# Patient Record
Sex: Female | Born: 1950 | Race: White | Hispanic: No | Smoking: Former smoker
Health system: Southern US, Community
[De-identification: ages and names within clinical notes are randomized; demographics above are authoritative.]

## PROBLEM LIST (undated history)

## (undated) DIAGNOSIS — R011 Cardiac murmur, unspecified: Secondary | ICD-10-CM

## (undated) DIAGNOSIS — M7581 Other shoulder lesions, right shoulder: Secondary | ICD-10-CM

## (undated) DIAGNOSIS — Z8669 Personal history of other diseases of the nervous system and sense organs: Secondary | ICD-10-CM

## (undated) DIAGNOSIS — R7303 Prediabetes: Secondary | ICD-10-CM

## (undated) DIAGNOSIS — F32A Depression, unspecified: Secondary | ICD-10-CM

## (undated) DIAGNOSIS — E785 Hyperlipidemia, unspecified: Secondary | ICD-10-CM

## (undated) DIAGNOSIS — I10 Essential (primary) hypertension: Secondary | ICD-10-CM

## (undated) DIAGNOSIS — M1990 Unspecified osteoarthritis, unspecified site: Secondary | ICD-10-CM

## (undated) DIAGNOSIS — F329 Major depressive disorder, single episode, unspecified: Secondary | ICD-10-CM

## (undated) DIAGNOSIS — H9190 Unspecified hearing loss, unspecified ear: Secondary | ICD-10-CM

## (undated) DIAGNOSIS — E538 Deficiency of other specified B group vitamins: Secondary | ICD-10-CM

## (undated) DIAGNOSIS — J3489 Other specified disorders of nose and nasal sinuses: Secondary | ICD-10-CM

## (undated) DIAGNOSIS — E042 Nontoxic multinodular goiter: Secondary | ICD-10-CM

## (undated) DIAGNOSIS — K219 Gastro-esophageal reflux disease without esophagitis: Secondary | ICD-10-CM

## (undated) DIAGNOSIS — K759 Inflammatory liver disease, unspecified: Secondary | ICD-10-CM

## (undated) DIAGNOSIS — J189 Pneumonia, unspecified organism: Secondary | ICD-10-CM

## (undated) DIAGNOSIS — G4730 Sleep apnea, unspecified: Secondary | ICD-10-CM

## (undated) DIAGNOSIS — M7521 Bicipital tendinitis, right shoulder: Secondary | ICD-10-CM

## (undated) DIAGNOSIS — M7541 Impingement syndrome of right shoulder: Secondary | ICD-10-CM

## (undated) DIAGNOSIS — Z87442 Personal history of urinary calculi: Secondary | ICD-10-CM

## (undated) DIAGNOSIS — J34829 Nasal valve collapse, unspecified: Secondary | ICD-10-CM

## (undated) DIAGNOSIS — I739 Peripheral vascular disease, unspecified: Secondary | ICD-10-CM

## (undated) HISTORY — DX: Hyperlipidemia, unspecified: E78.5

## (undated) HISTORY — PX: EYE SURGERY: SHX253

## (undated) HISTORY — PX: COLONOSCOPY: SHX174

## (undated) HISTORY — PX: ABDOMINOPLASTY: SUR9

## (undated) HISTORY — PX: ROTATOR CUFF REPAIR: SHX139

## (undated) HISTORY — PX: HERNIA REPAIR: SHX51

## (undated) HISTORY — DX: Unspecified osteoarthritis, unspecified site: M19.90

## (undated) HISTORY — PX: ABDOMINAL HYSTERECTOMY: SHX81

## (undated) HISTORY — PX: KNEE ARTHROSCOPY: SUR90

## (undated) HISTORY — PX: BILATERAL CARPAL TUNNEL RELEASE: SHX6508

---

## 2009-01-08 HISTORY — PX: OTHER SURGICAL HISTORY: SHX169

## 2009-06-10 ENCOUNTER — Ambulatory Visit: Payer: Self-pay

## 2009-07-05 ENCOUNTER — Ambulatory Visit (HOSPITAL_COMMUNITY): Admission: RE | Admit: 2009-07-05 | Discharge: 2009-07-06 | Payer: Self-pay | Admitting: Obstetrics and Gynecology

## 2009-07-29 ENCOUNTER — Ambulatory Visit: Payer: Self-pay | Admitting: General Practice

## 2009-10-13 ENCOUNTER — Ambulatory Visit: Payer: Self-pay | Admitting: *Deleted

## 2009-12-06 ENCOUNTER — Ambulatory Visit: Payer: Self-pay | Admitting: Internal Medicine

## 2009-12-28 ENCOUNTER — Ambulatory Visit: Payer: Self-pay | Admitting: Gastroenterology

## 2010-01-30 ENCOUNTER — Ambulatory Visit: Payer: Self-pay | Admitting: Surgery

## 2010-03-26 LAB — COMPREHENSIVE METABOLIC PANEL
ALT: 56 U/L — ABNORMAL HIGH (ref 0–35)
Alkaline Phosphatase: 120 U/L — ABNORMAL HIGH (ref 39–117)
CO2: 30 mEq/L (ref 19–32)
Chloride: 101 mEq/L (ref 96–112)
GFR calc non Af Amer: >60 mL/min (ref >60)
Glucose, Bld: 86 mg/dL (ref 70–99)
Potassium: 3.7 mEq/L (ref 3.5–5.1)
Sodium: 136 mEq/L (ref 135–145)
Total Protein: 7.2 g/dL (ref 6.0–8.3)

## 2010-03-26 LAB — CBC
HCT: 38.6 % (ref 36.0–46.0)
Hemoglobin: 11 g/dL — ABNORMAL LOW (ref 12.0–15.0)
Hemoglobin: 13.2 g/dL (ref 12.0–15.0)
MCH: 32.6 pg (ref 26.0–34.0)
MCHC: 35.2 g/dL (ref 30.0–36.0)
RBC: 4.16 MIL/uL (ref 3.87–5.11)
WBC: 6.4 10*3/uL (ref 4.0–10.5)

## 2010-03-26 LAB — SURGICAL PCR SCREEN: Staphylococcus aureus: NEGATIVE

## 2010-06-25 ENCOUNTER — Ambulatory Visit: Payer: Self-pay | Admitting: Internal Medicine

## 2010-09-26 ENCOUNTER — Ambulatory Visit: Payer: Self-pay | Admitting: Internal Medicine

## 2013-06-10 DIAGNOSIS — I10 Essential (primary) hypertension: Secondary | ICD-10-CM | POA: Insufficient documentation

## 2013-06-10 DIAGNOSIS — M23329 Other meniscus derangements, posterior horn of medial meniscus, unspecified knee: Secondary | ICD-10-CM | POA: Insufficient documentation

## 2013-07-01 ENCOUNTER — Ambulatory Visit: Payer: Self-pay | Admitting: Family Medicine

## 2013-10-20 DIAGNOSIS — F329 Major depressive disorder, single episode, unspecified: Secondary | ICD-10-CM | POA: Insufficient documentation

## 2013-10-20 DIAGNOSIS — E7800 Pure hypercholesterolemia, unspecified: Secondary | ICD-10-CM | POA: Insufficient documentation

## 2013-10-20 DIAGNOSIS — F32A Depression, unspecified: Secondary | ICD-10-CM | POA: Insufficient documentation

## 2013-10-20 DIAGNOSIS — R7301 Impaired fasting glucose: Secondary | ICD-10-CM | POA: Insufficient documentation

## 2013-12-17 DIAGNOSIS — K219 Gastro-esophageal reflux disease without esophagitis: Secondary | ICD-10-CM | POA: Insufficient documentation

## 2014-02-16 DIAGNOSIS — M7541 Impingement syndrome of right shoulder: Secondary | ICD-10-CM | POA: Insufficient documentation

## 2014-04-19 ENCOUNTER — Ambulatory Visit
Admit: 2014-04-19 | Disposition: A | Discharge status: Home / Self Care | Payer: Self-pay | Attending: Unknown Physician Specialty | Admitting: Unknown Physician Specialty

## 2014-05-03 LAB — SURGICAL PATHOLOGY

## 2014-05-04 DIAGNOSIS — E785 Hyperlipidemia, unspecified: Secondary | ICD-10-CM | POA: Insufficient documentation

## 2015-04-20 ENCOUNTER — Other Ambulatory Visit: Payer: Self-pay | Admitting: Physician Assistant

## 2015-04-20 DIAGNOSIS — Z1231 Encounter for screening mammogram for malignant neoplasm of breast: Secondary | ICD-10-CM

## 2015-04-28 ENCOUNTER — Ambulatory Visit: Payer: Self-pay

## 2015-05-03 ENCOUNTER — Ambulatory Visit
Admission: RE | Admit: 2015-05-03 | Discharge: 2015-05-03 | Disposition: A | Discharge status: Home / Self Care | Payer: Medicare Other | Source: Ambulatory Visit | Attending: Physician Assistant | Admitting: Physician Assistant

## 2015-05-03 DIAGNOSIS — Z1231 Encounter for screening mammogram for malignant neoplasm of breast: Secondary | ICD-10-CM

## 2015-05-06 ENCOUNTER — Encounter: Payer: Self-pay | Admitting: *Deleted

## 2015-05-09 ENCOUNTER — Ambulatory Visit: Payer: Medicare Other | Admitting: Anesthesiology

## 2015-05-09 ENCOUNTER — Encounter
Admission: RE | Disposition: A | Discharge status: Home / Self Care | Payer: Self-pay | Source: Ambulatory Visit | Attending: Ophthalmology

## 2015-05-09 ENCOUNTER — Ambulatory Visit
Admission: RE | Admit: 2015-05-09 | Discharge: 2015-05-09 | Disposition: A | Discharge status: Home / Self Care | Payer: Medicare Other | Source: Ambulatory Visit | Attending: Ophthalmology | Admitting: Ophthalmology

## 2015-05-09 DIAGNOSIS — I10 Essential (primary) hypertension: Secondary | ICD-10-CM | POA: Diagnosis not present

## 2015-05-09 DIAGNOSIS — Z90710 Acquired absence of both cervix and uterus: Secondary | ICD-10-CM | POA: Diagnosis not present

## 2015-05-09 DIAGNOSIS — Z7982 Long term (current) use of aspirin: Secondary | ICD-10-CM | POA: Insufficient documentation

## 2015-05-09 DIAGNOSIS — H2511 Age-related nuclear cataract, right eye: Secondary | ICD-10-CM | POA: Insufficient documentation

## 2015-05-09 DIAGNOSIS — Z880 Allergy status to penicillin: Secondary | ICD-10-CM | POA: Insufficient documentation

## 2015-05-09 DIAGNOSIS — F329 Major depressive disorder, single episode, unspecified: Secondary | ICD-10-CM | POA: Insufficient documentation

## 2015-05-09 DIAGNOSIS — Z9109 Other allergy status, other than to drugs and biological substances: Secondary | ICD-10-CM | POA: Insufficient documentation

## 2015-05-09 DIAGNOSIS — K219 Gastro-esophageal reflux disease without esophagitis: Secondary | ICD-10-CM | POA: Insufficient documentation

## 2015-05-09 DIAGNOSIS — Z79899 Other long term (current) drug therapy: Secondary | ICD-10-CM | POA: Insufficient documentation

## 2015-05-09 DIAGNOSIS — Z96653 Presence of artificial knee joint, bilateral: Secondary | ICD-10-CM | POA: Insufficient documentation

## 2015-05-09 DIAGNOSIS — H269 Unspecified cataract: Secondary | ICD-10-CM | POA: Diagnosis present

## 2015-05-09 DIAGNOSIS — Z9889 Other specified postprocedural states: Secondary | ICD-10-CM | POA: Insufficient documentation

## 2015-05-09 DIAGNOSIS — Z87891 Personal history of nicotine dependence: Secondary | ICD-10-CM | POA: Insufficient documentation

## 2015-05-09 HISTORY — DX: Gastro-esophageal reflux disease without esophagitis: K21.9

## 2015-05-09 HISTORY — DX: Personal history of other diseases of the nervous system and sense organs: Z86.69

## 2015-05-09 HISTORY — DX: Unspecified hearing loss, unspecified ear: H91.90

## 2015-05-09 HISTORY — PX: CATARACT EXTRACTION W/PHACO: SHX586

## 2015-05-09 HISTORY — DX: Essential (primary) hypertension: I10

## 2015-05-09 HISTORY — DX: Depression, unspecified: F32.A

## 2015-05-09 HISTORY — DX: Cardiac murmur, unspecified: R01.1

## 2015-05-09 HISTORY — DX: Major depressive disorder, single episode, unspecified: F32.9

## 2015-05-09 SURGERY — PHACOEMULSIFICATION, CATARACT, WITH IOL INSERTION
Anesthesia: Monitor Anesthesia Care | Site: Eye | Laterality: Right | Wound class: Clean

## 2015-05-09 MED ORDER — ALFENTANIL 500 MCG/ML IJ INJ
INJECTION | INTRAMUSCULAR | Status: DC | PRN
  Administered 2015-05-09: 500 ug via INTRAVENOUS

## 2015-05-09 MED ORDER — TETRACAINE HCL 0.5 % OP SOLN
OPHTHALMIC | Status: DC | PRN
  Administered 2015-05-09: 1 [drp] via OPHTHALMIC

## 2015-05-09 MED ORDER — MOXIFLOXACIN HCL 0.5 % OP SOLN
1.0000 [drp] | Freq: Once | OPHTHALMIC | Status: AC
  Administered 2015-05-09 (×3): 1 [drp] via OPHTHALMIC

## 2015-05-09 MED ORDER — LIDOCAINE HCL (PF) 4 % IJ SOLN
INTRAMUSCULAR | Status: AC
  Filled 2015-05-09: qty 5

## 2015-05-09 MED ORDER — PHENYLEPHRINE HCL 10 % OP SOLN
OPHTHALMIC | Status: AC
  Administered 2015-05-09: 1 [drp] via OPHTHALMIC
  Filled 2015-05-09: qty 5

## 2015-05-09 MED ORDER — TETRACAINE HCL 0.5 % OP SOLN
OPHTHALMIC | Status: AC
  Filled 2015-05-09: qty 2

## 2015-05-09 MED ORDER — CEFUROXIME OPHTHALMIC INJECTION 1 MG/0.1 ML
INJECTION | OPHTHALMIC | Status: AC
  Filled 2015-05-09: qty 0.1

## 2015-05-09 MED ORDER — MOXIFLOXACIN HCL 0.5 % OP SOLN
OPHTHALMIC | Status: AC
  Administered 2015-05-09: 1 [drp] via OPHTHALMIC
  Filled 2015-05-09: qty 3

## 2015-05-09 MED ORDER — SODIUM CHLORIDE 0.9 % IV SOLN
INTRAVENOUS | Status: DC
  Administered 2015-05-09: 07:00:00 via INTRAVENOUS

## 2015-05-09 MED ORDER — POVIDONE-IODINE 5 % OP SOLN
OPHTHALMIC | Status: AC
  Filled 2015-05-09: qty 30

## 2015-05-09 MED ORDER — BUPIVACAINE HCL (PF) 0.75 % IJ SOLN
INTRAMUSCULAR | Status: DC | PRN
  Administered 2015-05-09: 08:00:00 via OPHTHALMIC

## 2015-05-09 MED ORDER — EPINEPHRINE HCL 1 MG/ML IJ SOLN
INTRAMUSCULAR | Status: AC
  Filled 2015-05-09: qty 2

## 2015-05-09 MED ORDER — CEFUROXIME OPHTHALMIC INJECTION 1 MG/0.1 ML
INJECTION | OPHTHALMIC | Status: DC | PRN
  Administered 2015-05-09: 0.1 mL via INTRACAMERAL

## 2015-05-09 MED ORDER — PHENYLEPHRINE HCL 10 % OP SOLN
1.0000 [drp] | Freq: Once | OPHTHALMIC | Status: AC
  Administered 2015-05-09 (×4): 1 [drp] via OPHTHALMIC

## 2015-05-09 MED ORDER — LIDOCAINE HCL (PF) 4 % IJ SOLN
INTRAMUSCULAR | Status: DC | PRN
  Administered 2015-05-09: 08:00:00 via OPHTHALMIC

## 2015-05-09 MED ORDER — CYCLOPENTOLATE HCL 2 % OP SOLN
OPHTHALMIC | Status: AC
  Administered 2015-05-09: 1 [drp] via OPHTHALMIC
  Filled 2015-05-09: qty 2

## 2015-05-09 MED ORDER — NA CHONDROIT SULF-NA HYALURON 40-17 MG/ML IO SOLN
INTRAOCULAR | Status: AC
  Filled 2015-05-09: qty 1

## 2015-05-09 MED ORDER — HYALURONIDASE HUMAN 150 UNIT/ML IJ SOLN
INTRAMUSCULAR | Status: AC
  Filled 2015-05-09: qty 1

## 2015-05-09 MED ORDER — MOXIFLOXACIN HCL 0.5 % OP SOLN
OPHTHALMIC | Status: DC | PRN
  Administered 2015-05-09: 1 [drp] via OPHTHALMIC

## 2015-05-09 MED ORDER — POVIDONE-IODINE 5 % OP SOLN
OPHTHALMIC | Status: DC | PRN
  Administered 2015-05-09: 1 via OPHTHALMIC

## 2015-05-09 MED ORDER — NA CHONDROIT SULF-NA HYALURON 40-17 MG/ML IO SOLN
INTRAOCULAR | Status: DC | PRN
  Administered 2015-05-09: 1 mL via INTRAOCULAR

## 2015-05-09 MED ORDER — CYCLOPENTOLATE HCL 2 % OP SOLN
1.0000 [drp] | Freq: Once | OPHTHALMIC | Status: AC
  Administered 2015-05-09 (×4): 1 [drp] via OPHTHALMIC

## 2015-05-09 MED ORDER — BUPIVACAINE HCL (PF) 0.75 % IJ SOLN
INTRAMUSCULAR | Status: AC
  Filled 2015-05-09: qty 10

## 2015-05-09 MED ORDER — CARBACHOL 0.01 % IO SOLN
INTRAOCULAR | Status: DC | PRN
  Administered 2015-05-09: 0.5 mL via INTRAOCULAR

## 2015-05-09 SURGICAL SUPPLY — 30 items
CANNULA ANT/CHMB 27GA (MISCELLANEOUS) ×2 IMPLANT
CORD BIP STRL DISP 12FT (MISCELLANEOUS) ×2 IMPLANT
CUP MEDICINE 2OZ PLAST GRAD ST (MISCELLANEOUS) ×2 IMPLANT
DRAPE XRAY CASSETTE 23X24 (DRAPES) ×2 IMPLANT
ERASER HMR WETFIELD 18G (MISCELLANEOUS) ×2 IMPLANT
GLOVE BIO SURGEON STRL SZ8 (GLOVE) ×2 IMPLANT
GLOVE SURG LX 6.5 MICRO (GLOVE) ×1
GLOVE SURG LX 8.0 MICRO (GLOVE) ×1
GLOVE SURG LX STRL 6.5 MICRO (GLOVE) ×1 IMPLANT
GLOVE SURG LX STRL 8.0 MICRO (GLOVE) ×1 IMPLANT
GOWN STRL REUS W/ TWL LRG LVL3 (GOWN DISPOSABLE) ×1 IMPLANT
GOWN STRL REUS W/ TWL XL LVL3 (GOWN DISPOSABLE) ×1 IMPLANT
GOWN STRL REUS W/TWL LRG LVL3 (GOWN DISPOSABLE) ×1
GOWN STRL REUS W/TWL XL LVL3 (GOWN DISPOSABLE) ×1
LENS IOL ACRSF IQ ULTRA 24.0 (Intraocular Lens) ×1 IMPLANT
LENS IOL ACRYSOF IQ 24.0 (Intraocular Lens) ×2 IMPLANT
PACK CATARACT (MISCELLANEOUS) ×2 IMPLANT
PACK CATARACT DINGLEDEIN LX (MISCELLANEOUS) ×2 IMPLANT
PACK EYE AFTER SURG (MISCELLANEOUS) ×2 IMPLANT
SHLD EYE VISITEC  UNIV (MISCELLANEOUS) ×2 IMPLANT
SOL BSS BAG (MISCELLANEOUS) ×2
SOL PREP PVP 2OZ (MISCELLANEOUS) ×2
SOLUTION BSS BAG (MISCELLANEOUS) ×1 IMPLANT
SOLUTION PREP PVP 2OZ (MISCELLANEOUS) ×1 IMPLANT
SUT SILK 5-0 (SUTURE) ×2 IMPLANT
SYR 3ML LL SCALE MARK (SYRINGE) ×2 IMPLANT
SYR 5ML LL (SYRINGE) ×2 IMPLANT
SYR TB 1ML 27GX1/2 LL (SYRINGE) ×2 IMPLANT
WATER STERILE IRR 1000ML POUR (IV SOLUTION) ×2 IMPLANT
WIPE NON LINTING 3.25X3.25 (MISCELLANEOUS) ×2 IMPLANT

## 2015-05-09 NOTE — Anesthesia Postprocedure Evaluation (Signed)
Anesthesia Post Note  Patient: Alyssa Mcclure  Procedure(s) Performed: Procedure(s) (LRB): CATARACT EXTRACTION PHACO AND INTRAOCULAR LENS PLACEMENT (IOC) (Right)  Patient location during evaluation: Short Stay Anesthesia Type: MAC Level of consciousness: awake and alert and oriented Pain management: pain level controlled Vital Signs Assessment: post-procedure vital signs reviewed and stable Respiratory status: spontaneous breathing Cardiovascular status: stable Anesthetic complications: no    Last Vitals:  Filed Vitals:   05/09/15 0638 05/09/15 0855  BP: 129/77 125/56  Pulse: 74 67  Temp: 36.9 C 36.9 C  Resp: 16 14    Last Pain: There were no vitals filed for this visit.               Zachary GeorgeWeatherly,  Kaleigha Chamberlin F

## 2015-05-09 NOTE — Transfer of Care (Signed)
Immediate Anesthesia Transfer of Care Note  Patient: Alyssa Mcclure  Procedure(s) Performed: Procedure(s) with comments: CATARACT EXTRACTION PHACO AND INTRAOCULAR LENS PLACEMENT (IOC) (Right) - US 45.1 AP% 21.0 CDE 19.36 Fluid Pack Lot # [id number]  Patient Location: PACU and Short Stay  Anesthesia Type:MAC  Level of Consciousness: awake, alert  and oriented  Airway & Oxygen Therapy: Patient Spontanous Breathing  Post-op Assessment: Report given to RN and Post -op Vital signs reviewed and stable  Post vital signs: Reviewed and stable  Last Vitals:  Filed Vitals:   05/09/15 0638 05/09/15 0855  BP: 129/77 125/56  Pulse: 74 70  Temp: 36.9 C 37.7 C  Resp: 16 18    Last Pain: There were no vitals filed for this visit.       Complications: No apparent anesthesia complications

## 2015-05-09 NOTE — Anesthesia Preprocedure Evaluation (Signed)
Anesthesia Evaluation  Patient identified by MRN, date of birth, ID band Patient awake    Reviewed: Allergy & Precautions, NPO status , Patient's Chart, lab work & pertinent test results  Airway Mallampati: II       Dental  (+) Teeth Intact   Pulmonary neg pulmonary ROS, former smoker,    breath sounds clear to auscultation       Cardiovascular Exercise Tolerance: Good hypertension, Pt. on medications  Rhythm:Regular     Neuro/Psych    GI/Hepatic Neg liver ROS, GERD  ,  Endo/Other  negative endocrine ROS  Renal/GU negative Renal ROS     Musculoskeletal negative musculoskeletal ROS (+)   Abdominal Normal abdominal exam  (+)   Peds negative pediatric ROS (+)  Hematology negative hematology ROS (+)   Anesthesia Other Findings   Reproductive/Obstetrics                             Anesthesia Physical Anesthesia Plan  ASA: II  Anesthesia Plan: MAC   Post-op Pain Management:    Induction: Intravenous  Airway Management Planned: Natural Airway  Additional Equipment:   Intra-op Plan:   Post-operative Plan:   Informed Consent: I have reviewed the patients History and Physical, chart, labs and discussed the procedure including the risks, benefits and alternatives for the proposed anesthesia with the patient or authorized representative who has indicated his/her understanding and acceptance.     Plan Discussed with: CRNA  Anesthesia Plan Comments:         Anesthesia Quick Evaluation

## 2015-05-09 NOTE — Op Note (Signed)
Date of Surgery: 05/09/2015 Date of Dictation: 05/09/2015 8:53 AM Pre-operative Diagnosis:  Nuclear Sclerotic Cataract and Cortical Cataract right Eye Post-operative Diagnosis: same Procedure performed: Extra-capsular Cataract Extraction (ECCE) with placement of a posterior chamber intraocular lens (IOL) right Eye IOL:  Implant Name Type Inv. Item Serial No. Manufacturer Lot No. LRB No. Used  LENS IOL ACRYSOF IQ 24.0 - [id number] Intraocular Lens LENS IOL ACRYSOF IQ 24.0 [id number] ALCON   Right 1   Anesthesia: 2% Lidocaine and 4% Marcaine in a 50/50 mixture with 10 unites/ml of Hylenex given as a peribulbar Anesthesiologist: Anesthesiologist: Gijsbertus F Darleene CleaverVan Staveren, MD CRNA: Omer JackJanice Weatherly, CRNA Complications: none Estimated Blood Loss: less than 1 ml  Description of procedure:  The patient was given anesthesia and sedation via intravenous access. The patient was then prepped and draped in the usual fashion. A 25-gauge needle was bent for initiating the capsulorhexis. A 5-0 silk suture was placed through the conjunctiva superior and inferiorly to serve as bridle sutures. Hemostasis was obtained at the superior limbus using an eraser cautery. A partial thickness groove was made at the anterior surgical limbus with a 64 Beaver blade and this was dissected anteriorly with an SYSCOlcon Crescent knife. The anterior chamber was entered at 10 o'clock with a 1.0 mm paracentesis knife and through the lamellar dissection with a 2.6 mm Alcon keratome. Epi-Shugarcaine 0.5 CC [9 cc BSS Plus (Alcon), 3 cc 4% preservative-free lidocaine (Hospira) and 4 cc 1:1000 preservative-free, bisulfite-free epinephrine] was injected into the anterior chamber via the paracentesis tract. Epi-Shugarcaine 0.5 CC [9 cc BSS Plus (Alcon), 3 cc 4% preservative-free lidocaine (Hospira) and 4 cc 1:1000 preservative-free, bisulfite-free epinephrine] was injected into the anterior chamber via the paracentesis tract. DiscoVisc was  injected to replace the aqueous and a continuous tear curvilinear capsulorhexis was performed using a bent 25-gauge needle.  Balance salt on a syringe was used to perform hydro-dissection and phacoemulsification was carried out using a divide and conquer technique. Procedure(s) with comments: CATARACT EXTRACTION PHACO AND INTRAOCULAR LENS PLACEMENT (IOC) (Right) - US 45.1 AP% 21.0 CDE 19.36 Fluid Pack Lot # [id number]. Irrigation/aspiration was used to remove the residual cortex and the capsular bag was inflated with DiscoVisc. The intraocular lens was inserted into the capsular bag using a pre-loaded UltraSert Delivery System. Irrigation/aspiration was used to remove the residual DiscoVisc. The wound was inflated with balanced salt and checked for leaks. None were found. Miostat was injected via the paracentesis track and 0.1 ml of cefuroxime containing 1 mg of drug  was injected via the paracentesis track. The wound was checked for leaks again and none were found.   The bridal sutures were removed and two drops of Vigamox were placed on the eye. An eye shield was placed to protect the eye and the patient was discharged to the recovery area in good condition.   Xerxes Agrusa MD

## 2015-05-09 NOTE — Interval H&P Note (Signed)
History and Physical Interval Note:  05/09/2015 7:24 AM  Alyssa Mcclure  has presented today for surgery, with the diagnosis of CATARACT  The various methods of treatment have been discussed with the patient and family. After consideration of risks, benefits and other options for treatment, the patient has consented to  Procedure(s): CATARACT EXTRACTION PHACO AND INTRAOCULAR LENS PLACEMENT (IOC) (Right) as a surgical intervention .  The patient's history has been reviewed, patient examined, no change in status, stable for surgery.  I have reviewed the patient's chart and labs.  Questions were answered to the patient's satisfaction.     [name]

## 2015-05-09 NOTE — Discharge Instructions (Signed)
Eye Surgery Discharge Instructions  Expect mild scratchy sensation or mild soreness. DO NOT RUB YOUR EYE!  The day of surgery:  Minimal physical activity, but bed rest is not required  No reading, computer work, or close hand work  No bending, lifting, or straining.  May watch TV  For 24 hours:  No driving, legal decisions, or alcoholic beverages  Safety precautions  Eat anything you prefer: It is better to start with liquids, then soup then solid foods.  _____ Eye patch should be worn until postoperative exam tomorrow.  ____ Solar shield eyeglasses should be worn for comfort in the sunlight/patch while sleeping  Resume all regular medications including aspirin or Coumadin if these were discontinued prior to surgery. You may shower, bathe, shave, or wash your hair. Tylenol may be taken for mild discomfort.  Call your doctor if you experience significant pain, nausea, or vomiting, fever > 101 or other signs of infection. [phone] or [phone] Specific instructions:  Follow-up Information    Follow up with Charlot Gouin, MD. Go in 1 day.   Specialty:  Ophthalmology   Why:  Appointment time is set for 10:45 am TOMORROW , For wound re-check   Contact information:   [address]   [place] [id number] [phone]

## 2015-05-09 NOTE — H&P (Signed)
See scanned note.

## 2015-05-17 ENCOUNTER — Other Ambulatory Visit: Payer: Self-pay | Admitting: Physician Assistant

## 2015-05-17 DIAGNOSIS — R1319 Other dysphagia: Secondary | ICD-10-CM

## 2015-05-20 ENCOUNTER — Ambulatory Visit
Admission: RE | Admit: 2015-05-20 | Discharge: 2015-05-20 | Disposition: A | Discharge status: Home / Self Care | Payer: Medicare Other | Source: Ambulatory Visit | Attending: Physician Assistant | Admitting: Physician Assistant

## 2015-05-20 DIAGNOSIS — R1319 Other dysphagia: Secondary | ICD-10-CM | POA: Diagnosis present

## 2015-05-20 DIAGNOSIS — K449 Diaphragmatic hernia without obstruction or gangrene: Secondary | ICD-10-CM | POA: Diagnosis not present

## 2015-12-12 ENCOUNTER — Other Ambulatory Visit: Payer: Self-pay | Admitting: Physician Assistant

## 2015-12-12 DIAGNOSIS — R1011 Right upper quadrant pain: Secondary | ICD-10-CM

## 2015-12-16 ENCOUNTER — Ambulatory Visit
Admission: RE | Admit: 2015-12-16 | Discharge: 2015-12-16 | Disposition: A | Discharge status: Home / Self Care | Payer: Medicare Other | Source: Ambulatory Visit | Attending: Physician Assistant | Admitting: Physician Assistant

## 2015-12-16 DIAGNOSIS — R110 Nausea: Secondary | ICD-10-CM | POA: Insufficient documentation

## 2015-12-16 DIAGNOSIS — R1011 Right upper quadrant pain: Secondary | ICD-10-CM | POA: Diagnosis present

## 2016-02-21 ENCOUNTER — Encounter: Payer: Self-pay | Admitting: Urology

## 2016-02-21 ENCOUNTER — Ambulatory Visit: Payer: Medicare Other | Admitting: Urology

## 2016-02-21 ENCOUNTER — Ambulatory Visit: Payer: Self-pay | Admitting: Urology

## 2016-02-21 VITALS — BP 145/81 | HR 91 | Ht 64.0 in | Wt 179.1 lb

## 2016-02-21 DIAGNOSIS — N3941 Urge incontinence: Secondary | ICD-10-CM

## 2016-02-21 DIAGNOSIS — N952 Postmenopausal atrophic vaginitis: Secondary | ICD-10-CM | POA: Diagnosis not present

## 2016-02-21 DIAGNOSIS — R350 Frequency of micturition: Secondary | ICD-10-CM

## 2016-02-21 LAB — URINALYSIS, COMPLETE
Bilirubin, UA: NEGATIVE
GLUCOSE, UA: NEGATIVE
KETONES UA: NEGATIVE
Leukocytes, UA: NEGATIVE
NITRITE UA: NEGATIVE
PROTEIN UA: NEGATIVE
RBC, UA: NEGATIVE
UUROB: 0.2 mg/dL (ref 0.2–1.0)
pH, UA: 5.5 (ref 5.0–7.5)

## 2016-02-21 LAB — MICROSCOPIC EXAMINATION: BACTERIA UA: NONE SEEN

## 2016-02-21 LAB — BLADDER SCAN AMB NON-IMAGING: SCAN RESULT: 26

## 2016-02-21 NOTE — Progress Notes (Signed)
02/21/2016 3:04 PM   Alyssa Mcclure 1950-08-17 [id number]  Referring provider: [name], MD [address] [facility] [address] [facility] [place], [place] [address]  Chief Complaint  Patient presents with  . New Patient (Initial Visit)    Urinary frequency referred by Merlinda Frederick MD    HPI: Patient is a 66 -year-old Caucasian female who is referred to [place] by, [name], PA, for urinary incontinence.  Patient states that she has had urinary incontinence for years.  She had a bladder and colon lift in 2011.  It worked for two weeks and then the symptoms returned.    Patient has incontinence with urgency and stress.   She is experiencing 7 to 8 incontinent episodes during the day. She is experiencing one incontinent episodes during the night.  Her incontinence volume is large, sometimes going through the clothes.  She is wearing 6 to 8 pads/depends daily.    She is having associated urinary frequency, urgency and nocturia.   She does not have a history of urinary tract infections, STI's or injury to the bladder.   She denies dysuria, gross hematuria, suprapubic pain, back pain, abdominal pain or flank pain.   She has not had any recent fevers, chills, nausea or vomiting.   She does not have a history of nephrolithiasis or GU trauma.   She is not sexually active.  She is post menopausal.   She admits to constipation.  She is not having pain with bladder filling.    She has not had any recent imaging studies.      She is drinking a lot of water daily.   She is drinking two caffeinated beverages daily.  She is drinking a glass of wine daily.    Her risk factors for incontinence are obesity, (she is currently a member of a gym and exercises regularly)  a family history of incontinence, age, caffeine, diabetes, depression, vaginal atrophy and pelvic surgery.    She is taking  ACE inhibitors and antidepressants.  She is going to [place], [place] in the fall and does not want to  have this problem when she is on vacation.  Her UA was unremarkable at this time.  Her PVR is 26 mL.    PMH: Past Medical History:  Diagnosis Date  . Arthritis   . Depression   . GERD (gastroesophageal reflux disease)   . H/O Bell's palsy   . Heart murmur   . HLD (hyperlipidemia)   . HOH (hard of hearing)   . Hypertension     Surgical History: Past Surgical History:  Procedure Laterality Date  . ABDOMINAL HYSTERECTOMY    . BILATERAL CARPAL TUNNEL RELEASE Bilateral   . bladder lift  2011  . CATARACT EXTRACTION W/PHACO Right 05/09/2015   Procedure: CATARACT EXTRACTION PHACO AND INTRAOCULAR LENS PLACEMENT (IOC);  Surgeon: Sallee Lange, MD;  Location: [facility] ORS;  Service: Ophthalmology;  Laterality: Right;  [place] 45.1 AP% 21.0 CDE 19.36 Fluid Pack Lot # [id number]  . HERNIA REPAIR    . KNEE ARTHROSCOPY Bilateral   . ROTATOR CUFF REPAIR Bilateral     Home Medications:  Allergies as of 02/21/2016      Reactions   Penicillins    Statins Other (See Comments)   Latex Rash      Medication List       Accurate as of 02/21/16  3:04 PM. Always use your most recent med list.          aspirin  EC 81 MG tablet Take 81 mg by mouth daily.   Co Q-10 100 MG Caps Take 1 capsule by mouth every morning.   DULoxetine 60 MG capsule Commonly known as:  CYMBALTA Take 60 mg by mouth daily.   hydrochlorothiazide 25 MG tablet Commonly known as:  HYDRODIURIL Take 25 mg by mouth daily.   losartan 100 MG tablet Commonly known as:  COZAAR Take 100 mg by mouth daily.   pantoprazole 40 MG tablet Commonly known as:  PROTONIX Take 40 mg by mouth daily.   traZODone 50 MG tablet Commonly known as:  DESYREL Take by mouth.       Allergies:  Allergies  Allergen Reactions  . Penicillins   . Statins Other (See Comments)  . Latex Rash    Family History: Family History  Problem Relation Age of Onset  . Breast cancer Daughter 60  . Breast cancer Paternal Aunt   . Breast cancer  Cousin     mat cousin  . Prostate cancer Neg Hx   . Kidney cancer Neg Hx   . Bladder Cancer Neg Hx     Social History:  reports that she has quit smoking. Her smoking use included Cigarettes. She has never used smokeless tobacco. She reports that she drinks alcohol. She reports that she does not use drugs.  ROS: UROLOGY Frequent Urination?: Yes Hard to postpone urination?: Yes Burning/pain with urination?: No Get up at night to urinate?: Yes Leakage of urine?: Yes Urine stream starts and stops?: No Trouble starting stream?: No Do you have to strain to urinate?: No Blood in urine?: No Urinary tract infection?: No Sexually transmitted disease?: Yes Injury to kidneys or bladder?: No Painful intercourse?: No Weak stream?: Yes Currently pregnant?: No Vaginal bleeding?: No Last menstrual period?: n  Gastrointestinal Nausea?: Yes Vomiting?: Yes Indigestion/heartburn?: Yes Diarrhea?: No Constipation?: Yes  Constitutional Fever: No Night sweats?: No Weight loss?: No Fatigue?: Yes  Skin Skin rash/lesions?: No Itching?: No  Eyes Blurred vision?: Yes Double vision?: No  Ears/Nose/Throat Sore throat?: No Sinus problems?: Yes  Hematologic/Lymphatic Swollen glands?: No Easy bruising?: Yes  Cardiovascular Leg swelling?: No Chest pain?: No  Respiratory Cough?: No Shortness of breath?: No  Endocrine Excessive thirst?: No  Musculoskeletal Back pain?: No Joint pain?: Yes  Neurological Headaches?: No Dizziness?: No  Psychologic Depression?: No Anxiety?: No  Physical Exam: BP (!) 145/81   Pulse 91   Ht 5\' 4"  (1.626 m)   Wt 179 lb 1.6 oz (81.2 kg)   BMI 30.74 kg/m   Constitutional: Well nourished. Alert and oriented, No acute distress. HEENT: [place] AT, moist mucus membranes. Trachea midline, no masses. Cardiovascular: No clubbing, cyanosis, or edema. Respiratory: Normal respiratory effort, no increased work of breathing. GI: Abdomen is soft, non  tender, non distended, no abdominal masses. Liver and spleen not palpable.  No hernias appreciated.  Stool sample for occult testing is not indicated.   GU: No CVA tenderness.  No bladder fullness or masses.  Atrophic external genitalia, normal pubic hair distribution, no lesions.  Normal urethral meatus, no lesions, no prolapse, no discharge.   No urethral masses, tenderness and/or tenderness. No bladder fullness, tenderness or masses. Pale vagina mucosa, poor estrogen effect, no discharge, no lesions, good pelvic support, sling in place, no cystocele.  Small rectocele is noted.  Cervix, uterus and adnexa are surgically absent.  Anus and perineum are without rashes or lesions.    Skin: No rashes, bruises or suspicious lesions. Lymph: No cervical or inguinal  adenopathy. Neurologic: Grossly intact, no focal deficits, moving all 4 extremities. Psychiatric: Normal mood and affect.  Laboratory Data: Lab Results  Component Value Date   WBC 7.0 07/06/2009   HGB 11.0 REPEATED TO VERIFY (L) 07/06/2009   HCT 31.2 (L) 07/06/2009   MCV 92.6 07/06/2009   PLT  07/06/2009    152 DELTA CHECK NOTED SPECIMEN CHECKED FOR CLOTS REPEATED TO VERIFY    Lab Results  Component Value Date   CREATININE 0.68 07/04/2009    Lab Results  Component Value Date   AST 53 (H) 07/04/2009   Lab Results  Component Value Date   ALT 56 (H) 07/04/2009   Urinalysis Unremarkable.  See EPIC.  Pertinent Imaging: Results for MABLEAN, VENDITTO (MRN [id number]) as of 02/21/2016 15:08  Ref. Range 02/21/2016 14:32  Scan Result Unknown 26    Assessment & Plan:    1. Urge incontinence  - offered behavioral therapies, bladder training, bladder control strategies and pelvic floor muscle training - discussed PT and PTNS - she is interested in both of these therapies, but she is not sure her insurance will pay for the therapies  - fluid management - good fluid intake  - offered medical therapy with anticholinergic therapy or beta-3  adrenergic receptor agonist and the potential side effects of each therapy   - would like to try the beta-3 adrenergic receptor agonist (Myrbetriq).  Given Myrbetriq 25 mg samples, #28.  I have reviewed with the patient of the side effects of Myrbetriq, such as: elevation in BP, urinary retention and/or HA.     - will have an appointment with Dr. Sherron Monday as she has had a sling placed and it was ineffective    2. Vaginal atrophy  - I explained to the patient that when women go through menopause and her estrogen levels are severely diminished, the normal vaginal mucosa will change.    - A new medication, prasterone (Intrarosa) is a vaginal suppository containing DHEA also changes the vaginal pH.  I have given her samples as we did not have estrogen samples available at this time   Return for appointment with Dr Sherron Monday.  These notes generated with voice recognition software. I apologize for typographical errors.  Michiel Cowboy, PA-C  [facility] [address], [address] [place], [place] [address] [phone]

## 2016-03-01 ENCOUNTER — Encounter: Payer: Self-pay | Admitting: Urology

## 2016-03-01 ENCOUNTER — Ambulatory Visit: Payer: Medicare Other | Admitting: Urology

## 2016-03-01 VITALS — BP 115/77 | HR 98 | Ht 65.0 in | Wt 179.0 lb

## 2016-03-01 DIAGNOSIS — N3946 Mixed incontinence: Secondary | ICD-10-CM | POA: Diagnosis not present

## 2016-03-01 NOTE — Progress Notes (Signed)
03/01/2016 2:48 PM   Alyssa Mcclure 12/06/1950 [id number]  Referring provider: [name], MD [address] [facility] [address] [facility] [place], [place] [address]  Chief Complaint  Patient presents with  . Urinary Incontinence    HPI: I was consulted to assess the patient's urinary incontinence worsening over years. She describes prolapse surgery with the sling likely by a gynecologist in [place]. She describes primarily stress incontinence prior to that surgery. She thinks her incontinence has worsened since  She currently leaks with coughing and sneezing and bending and lifting. 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 Standing she can leak high volumes without any awareness. She has no bedwetting. She can soak 3-5 pads per day  She voids every 2 hours and gets up 2-3 times to urinate. She has a good flow  She describes vaginal splinting for bowel movements and is prone to constipation.  She occasionally might get a urinary tract infection and drinks a lot of fluids  Modifying factors: There are no other modifying factors  Associated signs and symptoms: There are no other associated signs and symptoms Aggravating and relieving factors: There are no other aggravating or relieving factors Severity: Moderate Duration: Persistent   PMH: Past Medical History:  Diagnosis Date  . Arthritis   . Depression   . GERD (gastroesophageal reflux disease)   . H/O Bell's palsy   . Heart murmur   . HLD (hyperlipidemia)   . HOH (hard of hearing)   . Hypertension     Surgical History: Past Surgical History:  Procedure Laterality Date  . ABDOMINAL HYSTERECTOMY    . BILATERAL CARPAL TUNNEL RELEASE Bilateral   . bladder lift  2011  . CATARACT EXTRACTION W/PHACO Right 05/09/2015   Procedure: CATARACT EXTRACTION PHACO AND INTRAOCULAR LENS PLACEMENT (IOC);  Surgeon: Sallee Lange, MD;  Location: [facility] ORS;   Service: Ophthalmology;  Laterality: Right;  [place] 45.1 AP% 21.0 CDE 19.36 Fluid Pack Lot # [id number]  . HERNIA REPAIR    . KNEE ARTHROSCOPY Bilateral   . ROTATOR CUFF REPAIR Bilateral     Home Medications:  Allergies as of 03/01/2016      Reactions   Penicillins    Statins Other (See Comments)   Latex Rash      Medication List       Accurate as of 03/01/16  2:48 PM. Always use your most recent med list.          aspirin EC 81 MG tablet Take 81 mg by mouth daily.   Co Q-10 100 MG Caps Take 1 capsule by mouth every morning.   DULoxetine 60 MG capsule Commonly known as:  CYMBALTA Take 60 mg by mouth daily.   hydrochlorothiazide 25 MG tablet Commonly known as:  HYDRODIURIL Take 25 mg by mouth daily.   losartan 100 MG tablet Commonly known as:  COZAAR Take 100 mg by mouth daily.   pantoprazole 40 MG tablet Commonly known as:  PROTONIX Take 40 mg by mouth daily.   traZODone 50 MG tablet Commonly known as:  DESYREL Take by mouth.       Allergies:  Allergies  Allergen Reactions  . Penicillins   . Statins Other (See Comments)  . Latex Rash    Family History: Family History  Problem Relation Age of Onset  . Breast cancer Daughter 35  . Breast cancer Paternal Aunt   . Breast cancer Cousin  mat cousin  . Prostate cancer Neg Hx   . Kidney cancer Neg Hx   . Bladder Cancer Neg Hx     Social History:  reports that she has quit smoking. Her smoking use included Cigarettes. She has never used smokeless tobacco. She reports that she drinks alcohol. She reports that she does not use drugs.  ROS: UROLOGY Frequent Urination?: Yes Hard to postpone urination?: Yes Burning/pain with urination?: No Get up at night to urinate?: Yes Leakage of urine?: Yes Urine stream starts and stops?: Yes Trouble starting stream?: No Do you have to strain to urinate?: No Blood in urine?: No Urinary tract infection?: No Sexually transmitted disease?: No Injury to kidneys or  bladder?: No Painful intercourse?: No Weak stream?: No Currently pregnant?: No Vaginal bleeding?: No Last menstrual period?: n  Gastrointestinal Nausea?: No Vomiting?: No Indigestion/heartburn?: No Diarrhea?: No Constipation?: Yes  Constitutional Fever: No Night sweats?: No Weight loss?: Yes Fatigue?: Yes  Skin Skin rash/lesions?: No Itching?: No  Eyes Blurred vision?: No Double vision?: No  Ears/Nose/Throat Sore throat?: No Sinus problems?: Yes  Hematologic/Lymphatic Swollen glands?: No Easy bruising?: No  Cardiovascular Leg swelling?: No Chest pain?: No  Respiratory Cough?: Yes Shortness of breath?: No  Endocrine Excessive thirst?: No  Musculoskeletal Back pain?: Yes Joint pain?: Yes  Neurological Headaches?: No Dizziness?: No  Psychologic Depression?: No Anxiety?: No  Physical Exam: BP 115/77   Pulse 98   Ht 5\' 5"  (1.651 m)   Wt 81.2 kg (179 lb)   BMI 29.79 kg/m   Constitutional:  Alert and oriented, No acute distress. HEENT: [place] AT, moist mucus membranes.  Trachea midline, no masses. Cardiovascular: No clubbing, cyanosis, or edema. Respiratory: Normal respiratory effort, no increased work of breathing. GI: Abdomen is soft, nontender, nondistended, no abdominal masses GU: No CVA tenderness. On pelvic examination the patient had a fixed bladder neck and a negative cough test. I could feel a ridge bilaterally at the urethrovesical angle and she likely has had a trans-obturator tape procedure. I could not see or feel any definitive extrusion. She had a very high milder grade 2 cystocele that did not descend. She had very reasonable vaginal length. She had a small grade 1 rectocele and mild perineal bulging. She pointed to the perineal area where she presses and does her splinting maneuver Skin: No rashes, bruises or suspicious lesions. Lymph: No cervical or inguinal adenopathy. Neurologic: Grossly intact, no focal deficits, moving all 4  extremities. Psychiatric: Normal mood and affect.  Laboratory Data: Lab Results  Component Value Date   WBC 7.0 07/06/2009   HGB 11.0 REPEATED TO VERIFY (L) 07/06/2009   HCT 31.2 (L) 07/06/2009   MCV 92.6 07/06/2009   PLT  07/06/2009    152 DELTA CHECK NOTED SPECIMEN CHECKED FOR CLOTS REPEATED TO VERIFY    Lab Results  Component Value Date   CREATININE 0.68 07/04/2009    No results found for: PSA  No results found for: TESTOSTERONE  No results found for: HGBA1C  Urinalysis    Component Value Date/Time   APPEARANCEUR Clear 02/21/2016 1426   GLUCOSEU Negative 02/21/2016 1426   BILIRUBINUR Negative 02/21/2016 1426   PROTEINUR Negative 02/21/2016 1426   NITRITE Negative 02/21/2016 1426   LEUKOCYTESUR Negative 02/21/2016 1426    Pertinent Imaging: none  Assessment & Plan:  The patient has mixed incontinence and likely is having unstable bladder contractions with high volume leakage on times not associated with awareness. She has frequency and nighttime frequency. She is  splinting for bowel movements  On pelvic examination the patient had a fixed bladder neck and a negative cough test. I could feel a ridge bilaterally at the urethrovesical angle and she likely has had a trans-obturator tape procedure. I could not see or feel any definitive extrusion. She had a very high milder grade 2 cystocele that did not descend. She had very reasonable vaginal length. She had a small grade 1 rectocele and mild perineal bulging. She pointed to the perineal area where she presses and does her splinting maneuver. Moderate atophy  I believe she is tried to beta 3 agonists at 25 mg but not other medications; Carollee Herter also tried DHEA suppositories.,  She has had mild benefit from the beta 3 agonists but has not tried the suppository at  The patient will be evaluated with urodynamics and I will perform cystoscopy on her next visit here in clinic. We will proceed accordingly.  It'll be interesting  to see how overactive her bladder is; however her bladder descends. Urethral injectables may be a distant option to help her as well. On her next visit I will draw her a picture and discuss her incontinence and her splinting as well as perform cystoscopy  There are no diagnoses linked to this encounter.  No Follow-up on file.  Martina Sinner, MD  [facility] [address] [place], [place] [address] [phone]

## 2016-03-02 DIAGNOSIS — G4733 Obstructive sleep apnea (adult) (pediatric): Secondary | ICD-10-CM | POA: Insufficient documentation

## 2016-03-02 DIAGNOSIS — J3489 Other specified disorders of nose and nasal sinuses: Secondary | ICD-10-CM | POA: Insufficient documentation

## 2016-03-19 ENCOUNTER — Encounter: Payer: Self-pay | Admitting: *Deleted

## 2016-03-27 NOTE — H&P (Signed)
See scanned note.

## 2016-03-28 ENCOUNTER — Ambulatory Visit
Admission: RE | Admit: 2016-03-28 | Discharge: 2016-03-28 | Disposition: A | Discharge status: Home / Self Care | Payer: Medicare Other | Source: Ambulatory Visit | Attending: Ophthalmology | Admitting: Ophthalmology

## 2016-03-28 ENCOUNTER — Ambulatory Visit: Payer: Medicare Other | Admitting: Anesthesiology

## 2016-03-28 ENCOUNTER — Encounter
Admission: RE | Disposition: A | Discharge status: Home / Self Care | Payer: Self-pay | Source: Ambulatory Visit | Attending: Ophthalmology

## 2016-03-28 ENCOUNTER — Encounter: Payer: Self-pay | Admitting: *Deleted

## 2016-03-28 DIAGNOSIS — F329 Major depressive disorder, single episode, unspecified: Secondary | ICD-10-CM | POA: Insufficient documentation

## 2016-03-28 DIAGNOSIS — Z79899 Other long term (current) drug therapy: Secondary | ICD-10-CM | POA: Diagnosis not present

## 2016-03-28 DIAGNOSIS — E785 Hyperlipidemia, unspecified: Secondary | ICD-10-CM | POA: Insufficient documentation

## 2016-03-28 DIAGNOSIS — E669 Obesity, unspecified: Secondary | ICD-10-CM | POA: Diagnosis not present

## 2016-03-28 DIAGNOSIS — I10 Essential (primary) hypertension: Secondary | ICD-10-CM | POA: Diagnosis not present

## 2016-03-28 DIAGNOSIS — M1990 Unspecified osteoarthritis, unspecified site: Secondary | ICD-10-CM | POA: Diagnosis not present

## 2016-03-28 DIAGNOSIS — H2512 Age-related nuclear cataract, left eye: Secondary | ICD-10-CM | POA: Diagnosis not present

## 2016-03-28 DIAGNOSIS — K219 Gastro-esophageal reflux disease without esophagitis: Secondary | ICD-10-CM | POA: Diagnosis not present

## 2016-03-28 DIAGNOSIS — Z87891 Personal history of nicotine dependence: Secondary | ICD-10-CM | POA: Diagnosis not present

## 2016-03-28 DIAGNOSIS — Z6830 Body mass index (BMI) 30.0-30.9, adult: Secondary | ICD-10-CM | POA: Insufficient documentation

## 2016-03-28 HISTORY — PX: CATARACT EXTRACTION W/PHACO: SHX586

## 2016-03-28 HISTORY — DX: Inflammatory liver disease, unspecified: K75.9

## 2016-03-28 SURGERY — PHACOEMULSIFICATION, CATARACT, WITH IOL INSERTION
Anesthesia: Monitor Anesthesia Care | Site: Eye | Laterality: Left | Wound class: Clean

## 2016-03-28 MED ORDER — POVIDONE-IODINE 5 % OP SOLN
OPHTHALMIC | Status: AC
  Filled 2016-03-28: qty 30

## 2016-03-28 MED ORDER — HYALURONIDASE HUMAN 150 UNIT/ML IJ SOLN
INTRAMUSCULAR | Status: AC
  Filled 2016-03-28: qty 1

## 2016-03-28 MED ORDER — MOXIFLOXACIN HCL 0.5 % OP SOLN
OPHTHALMIC | Status: DC | PRN
  Administered 2016-03-28: .2 mL via OPHTHALMIC

## 2016-03-28 MED ORDER — LIDOCAINE HCL (PF) 4 % IJ SOLN
INTRAMUSCULAR | Status: DC | PRN
  Administered 2016-03-28: 2.25 mL via OPHTHALMIC

## 2016-03-28 MED ORDER — CYCLOPENTOLATE HCL 2 % OP SOLN
OPHTHALMIC | Status: AC
  Filled 2016-03-28: qty 2

## 2016-03-28 MED ORDER — NA CHONDROIT SULF-NA HYALURON 40-17 MG/ML IO SOLN
INTRAOCULAR | Status: DC | PRN
  Administered 2016-03-28: 1 mL via INTRAOCULAR

## 2016-03-28 MED ORDER — TETRACAINE HCL 0.5 % OP SOLN
OPHTHALMIC | Status: AC
  Filled 2016-03-28: qty 2

## 2016-03-28 MED ORDER — SODIUM CHLORIDE 0.9 % IV SOLN
INTRAVENOUS | Status: DC
  Administered 2016-03-28 (×2): via INTRAVENOUS

## 2016-03-28 MED ORDER — EPINEPHRINE PF 1 MG/ML IJ SOLN
INTRAOCULAR | Status: DC | PRN
  Administered 2016-03-28: 1 mL via OPHTHALMIC

## 2016-03-28 MED ORDER — TETRACAINE HCL 0.5 % OP SOLN
OPHTHALMIC | Status: DC | PRN
  Administered 2016-03-28: 1 [drp] via OPHTHALMIC

## 2016-03-28 MED ORDER — CEFUROXIME OPHTHALMIC INJECTION 1 MG/0.1 ML
INJECTION | OPHTHALMIC | Status: AC
  Filled 2016-03-28: qty 0.1

## 2016-03-28 MED ORDER — MIDAZOLAM HCL 2 MG/2ML IJ SOLN
INTRAMUSCULAR | Status: AC
Start: 2016-03-28 — End: 2016-03-28
  Filled 2016-03-28: qty 2

## 2016-03-28 MED ORDER — BUPIVACAINE HCL (PF) 0.75 % IJ SOLN
INTRAMUSCULAR | Status: AC
  Filled 2016-03-28: qty 10

## 2016-03-28 MED ORDER — MOXIFLOXACIN HCL 0.5 % OP SOLN
1.0000 [drp] | OPHTHALMIC | Status: AC
  Administered 2016-03-28 (×3): 1 [drp] via OPHTHALMIC

## 2016-03-28 MED ORDER — MOXIFLOXACIN HCL 0.5 % OP SOLN
OPHTHALMIC | Status: AC
  Filled 2016-03-28: qty 3

## 2016-03-28 MED ORDER — PHENYLEPHRINE HCL 10 % OP SOLN
OPHTHALMIC | Status: AC
  Administered 2016-03-28: 1 [drp] via OPHTHALMIC
  Filled 2016-03-28: qty 5

## 2016-03-28 MED ORDER — ACETYLCHOLINE CHLORIDE 20 MG IO SOLR
INTRAOCULAR | Status: AC
  Filled 2016-03-28: qty 1

## 2016-03-28 MED ORDER — LIDOCAINE HCL (PF) 4 % IJ SOLN
INTRAMUSCULAR | Status: DC | PRN
  Administered 2016-03-28: 4 mL via OPHTHALMIC

## 2016-03-28 MED ORDER — NA CHONDROIT SULF-NA HYALURON 40-17 MG/ML IO SOLN
INTRAOCULAR | Status: AC
  Filled 2016-03-28: qty 1

## 2016-03-28 MED ORDER — ALFENTANIL 500 MCG/ML IJ INJ
INJECTION | INTRAMUSCULAR | Status: DC | PRN
  Administered 2016-03-28 (×2): 250 ug via INTRAVENOUS

## 2016-03-28 MED ORDER — PHENYLEPHRINE HCL 10 % OP SOLN
1.0000 [drp] | OPHTHALMIC | Status: AC
  Administered 2016-03-28 (×4): 1 [drp] via OPHTHALMIC

## 2016-03-28 MED ORDER — POVIDONE-IODINE 5 % OP SOLN
OPHTHALMIC | Status: DC | PRN
  Administered 2016-03-28: 1 via OPHTHALMIC

## 2016-03-28 MED ORDER — MOXIFLOXACIN HCL 0.5 % OP SOLN
OPHTHALMIC | Status: AC
  Administered 2016-03-28: 1 [drp] via OPHTHALMIC
  Filled 2016-03-28: qty 3

## 2016-03-28 MED ORDER — EPINEPHRINE PF 1 MG/ML IJ SOLN
INTRAMUSCULAR | Status: AC
  Filled 2016-03-28: qty 2

## 2016-03-28 MED ORDER — MIDAZOLAM HCL 2 MG/2ML IJ SOLN
INTRAMUSCULAR | Status: DC | PRN
  Administered 2016-03-28: 0.5 mg via INTRAVENOUS

## 2016-03-28 MED ORDER — CARBACHOL 0.01 % IO SOLN
INTRAOCULAR | Status: DC | PRN
  Administered 2016-03-28: .5 mL via INTRAOCULAR

## 2016-03-28 MED ORDER — CYCLOPENTOLATE HCL 2 % OP SOLN
1.0000 [drp] | OPHTHALMIC | Status: AC
  Administered 2016-03-28 (×4): 1 [drp] via OPHTHALMIC

## 2016-03-28 SURGICAL SUPPLY — 30 items
CANNULA ANT/CHMB 27GA (MISCELLANEOUS) ×2 IMPLANT
CORD BIP STRL DISP 12FT (MISCELLANEOUS) ×2 IMPLANT
CUP MEDICINE 2OZ PLAST GRAD ST (MISCELLANEOUS) ×2 IMPLANT
DRAPE XRAY CASSETTE 23X24 (DRAPES) ×2 IMPLANT
ERASER HMR WETFIELD 18G (MISCELLANEOUS) ×2 IMPLANT
GLOVE BIO SURGEON STRL SZ8 (GLOVE) ×2 IMPLANT
GLOVE SURG LX 6.5 MICRO (GLOVE) ×1
GLOVE SURG LX 8.0 MICRO (GLOVE) ×1
GLOVE SURG LX STRL 6.5 MICRO (GLOVE) ×1 IMPLANT
GLOVE SURG LX STRL 8.0 MICRO (GLOVE) ×1 IMPLANT
GOWN STRL REUS W/ TWL LRG LVL3 (GOWN DISPOSABLE) ×1 IMPLANT
GOWN STRL REUS W/ TWL XL LVL3 (GOWN DISPOSABLE) ×1 IMPLANT
GOWN STRL REUS W/TWL LRG LVL3 (GOWN DISPOSABLE) ×1
GOWN STRL REUS W/TWL XL LVL3 (GOWN DISPOSABLE) ×1
LENS IOL ACRSF IQ ULTRA 23.0 (Intraocular Lens) ×1 IMPLANT
LENS IOL ACRYSOF IQ 23.0 (Intraocular Lens) ×2 IMPLANT
PACK CATARACT (MISCELLANEOUS) ×2 IMPLANT
PACK CATARACT DINGLEDEIN LX (MISCELLANEOUS) ×2 IMPLANT
PACK EYE AFTER SURG (MISCELLANEOUS) ×2 IMPLANT
SHLD EYE VISITEC  UNIV (MISCELLANEOUS) ×2 IMPLANT
SOL BSS BAG (MISCELLANEOUS) ×2
SOL PREP PVP 2OZ (MISCELLANEOUS) ×2
SOLUTION BSS BAG (MISCELLANEOUS) ×1 IMPLANT
SOLUTION PREP PVP 2OZ (MISCELLANEOUS) ×1 IMPLANT
SUT SILK 5-0 (SUTURE) ×2 IMPLANT
SYR 3ML LL SCALE MARK (SYRINGE) ×2 IMPLANT
SYR 5ML LL (SYRINGE) ×2 IMPLANT
SYR TB 1ML 27GX1/2 LL (SYRINGE) ×2 IMPLANT
WATER STERILE IRR 250ML POUR (IV SOLUTION) ×2 IMPLANT
WIPE NON LINTING 3.25X3.25 (MISCELLANEOUS) ×2 IMPLANT

## 2016-03-28 NOTE — Op Note (Signed)
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 Item Serial No. Manufacturer Lot No. LRB No. Used  LENS IOL ACRYSOF IQ 23.0 - [id number] Intraocular Lens LENS IOL ACRYSOF IQ 23.0 [id number] ALCON   Left 1   Anesthesia: 2% Lidocaine and 4% Marcaine in a 50/50 mixture with 10 unites/ml of Hylenex given as a peribulbar Anesthesiologist: Anesthesiologist: Alver FisherAmy Penwarden, MD CRNA: Charna Busmanhomas Diamond, CRNA Complications: none Estimated Blood Loss: less than 1 ml  Description of procedure:  The patient was given anesthesia and sedation via intravenous access. The patient was then prepped and draped in the usual fashion. A 25-gauge needle was bent for initiating the capsulorhexis. A 5-0 silk suture was placed through the conjunctiva superior and inferiorly to serve as bridle sutures. Hemostasis was obtained at the superior limbus using an eraser cautery. A partial thickness groove was made at the anterior surgical limbus with a 64 Beaver blade and this was dissected anteriorly with an SYSCOlcon Crescent knife. The anterior chamber was entered at 10 o'clock with a 1.0 mm paracentesis knife and through the lamellar dissection with a 2.6 mm Alcon keratome. Epi-Shugarcaine 0.5 CC [9 cc BSS Plus (Alcon), 3 cc 4% preservative-free lidocaine (Hospira) and 4 cc 1:1000 preservative-free, bisulfite-free epinephrine] was injected into the anterior chamber via the paracentesis tract. Epi-Shugarcaine 0.5 CC [9 cc BSS Plus (Alcon), 3 cc 4% preservative-free lidocaine (Hospira) and 4 cc 1:1000 preservative-free, bisulfite-free epinephrine] was injected into the anterior chamber via the paracentesis tract. DiscoVisc was injected to  replace the aqueous and a continuous tear curvilinear capsulorhexis was performed using a bent 25-gauge needle.  Balance salt on a syringe was used to perform hydro-dissection and phacoemulsification was carried out using a divide and conquer technique. Procedure(s) with comments: CATARACT EXTRACTION PHACO AND INTRAOCULAR LENS PLACEMENT (IOC) (Left) - US 01:05 AP% 21.3 CDE 24.21 fluid pack lot # [id number]. Irrigation/aspiration was used to remove the residual cortex and the capsular bag was inflated with DiscoVisc. The intraocular lens was inserted into the capsular bag using a pre-loaded UltraSert Delivery System. Irrigation/aspiration was used to remove the residual DiscoVisc. The wound was inflated with balanced salt and checked for leaks. None were found. Miostat was injected via the paracentesis track and 0.1 ml of Vigamox containing 1 mg of drug  was injected via the paracentesis track. The wound was checked for leaks again and none were found.   The bridal sutures were removed and two drops of Vigamox were placed on the eye. An eye shield was placed to protect the eye and the patient was discharged to the recovery area in good condition.   Anaisha Mago MD

## 2016-03-28 NOTE — Interval H&P Note (Signed)
History and Physical Interval Note:  03/28/2016 7:28 AM  Alyssa Mcclure  has presented today for surgery, with the diagnosis of CATARACT  The various methods of treatment have been discussed with the patient and family. After consideration of risks, benefits and other options for treatment, the patient has consented to  Procedure(s): CATARACT EXTRACTION PHACO AND INTRAOCULAR LENS PLACEMENT (IOC) (Left) as a surgical intervention .  The patient's history has been reviewed, patient examined, no change in status, stable for surgery.  I have reviewed the patient's chart and labs.  Questions were answered to the patient's satisfaction.     [name]

## 2016-03-28 NOTE — Transfer of Care (Signed)
Immediate Anesthesia Transfer of Care Note  Patient: LILLIONNA NABI  Procedure(s) Performed: Procedure(s) with comments: CATARACT EXTRACTION PHACO AND INTRAOCULAR LENS PLACEMENT (IOC) (Left) - [place] 01:05 AP% 21.3 CDE 24.21 fluid pack lot # [id number]  Patient Location: PACU and Short Stay  Anesthesia Type:MAC  Level of Consciousness: awake and patient cooperative  Airway & Oxygen Therapy: Patient Spontanous Breathing  Post-op Assessment: Report given to RN and Post -op Vital signs reviewed and stable  Post vital signs: Reviewed and stable  Last Vitals:  Vitals:   03/28/16 0730  BP: (!) 142/74  Pulse: 89  Resp: 16  Temp: 36.6 C    Last Pain:  Vitals:   03/28/16 0730  TempSrc: Oral         Complications: No apparent anesthesia complications

## 2016-03-28 NOTE — Anesthesia Post-op Follow-up Note (Cosign Needed)
Anesthesia QCDR form completed.        

## 2016-03-28 NOTE — Anesthesia Postprocedure Evaluation (Signed)
Anesthesia Post Note  Patient: Alyssa Mcclure  Procedure(s) Performed: Procedure(s) (LRB): CATARACT EXTRACTION PHACO AND INTRAOCULAR LENS PLACEMENT (IOC) (Left)  Patient location during evaluation: PACU Anesthesia Type: MAC Level of consciousness: awake and alert and oriented Pain management: pain level controlled Vital Signs Assessment: post-procedure vital signs reviewed and stable Respiratory status: spontaneous breathing, nonlabored ventilation and respiratory function stable Cardiovascular status: blood pressure returned to baseline and stable Postop Assessment: no signs of nausea or vomiting Anesthetic complications: no     Last Vitals:  Vitals:   03/28/16 1003 03/28/16 1004  BP: 136/75 136/75  Pulse: 79 82  Resp: 16 16  Temp: 36.4 C 36.4 C    Last Pain:  Vitals:   03/28/16 0730  TempSrc: Oral                 Yanessa Hocevar

## 2016-03-28 NOTE — Anesthesia Preprocedure Evaluation (Signed)
Anesthesia Evaluation  Patient identified by MRN, date of birth, ID band Patient awake    Reviewed: Allergy & Precautions, NPO status , Patient's Chart, lab work & pertinent test results  History of Anesthesia Complications Negative for: history of anesthetic complications  Airway Mallampati: II  TM Distance: >3 FB Neck ROM: Full    Dental   Pulmonary neg sleep apnea, neg COPD, former smoker,    breath sounds clear to auscultation- rhonchi (-) wheezing      Cardiovascular Exercise Tolerance: Good hypertension, Pt. on medications (-) CAD and (-) Past MI  Rhythm:Regular Rate:Normal - Systolic murmurs and - Diastolic murmurs    Neuro/Psych PSYCHIATRIC DISORDERS Depression negative neurological ROS     GI/Hepatic Neg liver ROS, GERD  ,  Endo/Other  negative endocrine ROSneg diabetes  Renal/GU negative Renal ROS     Musculoskeletal  (+) Arthritis ,   Abdominal (+) + obese,   Peds  Hematology negative hematology ROS (+)   Anesthesia Other Findings Past Medical History: No date: Arthritis No date: Depression No date: GERD (gastroesophageal reflux disease) No date: H/O Bell's palsy No date: Heart murmur No date: Hepatitis No date: HLD (hyperlipidemia) No date: HOH (hard of hearing) No date: Hypertension   Reproductive/Obstetrics                             Anesthesia Physical Anesthesia Plan  ASA: II  Anesthesia Plan: MAC   Post-op Pain Management:    Induction: Intravenous  Airway Management Planned: Natural Airway  Additional Equipment:   Intra-op Plan:   Post-operative Plan:   Informed Consent: I have reviewed the patients History and Physical, chart, labs and discussed the procedure including the risks, benefits and alternatives for the proposed anesthesia with the patient or authorized representative who has indicated his/her understanding and acceptance.     Plan  Discussed with: CRNA and Anesthesiologist  Anesthesia Plan Comments:         Anesthesia Quick Evaluation

## 2016-03-28 NOTE — Discharge Instructions (Signed)
Eye Surgery Discharge Instructions  Expect mild scratchy sensation or mild soreness. DO NOT RUB YOUR EYE!  The day of surgery:  Minimal physical activity, but bed rest is not required  No reading, computer work, or close hand work  No bending, lifting, or straining.  May watch TV  For 24 hours:  No driving, legal decisions, or alcoholic beverages  Safety precautions  Eat anything you prefer: It is better to start with liquids, then soup then solid foods.  _____ Eye patch should be worn until postoperative exam tomorrow.  ____ Solar shield eyeglasses should be worn for comfort in the sunlight/patch while sleeping  Resume all regular medications including aspirin or Coumadin if these were discontinued prior to surgery. You may shower, bathe, shave, or wash your hair. Tylenol may be taken for mild discomfort.  Call your doctor if you experience significant pain, nausea, or vomiting, fever > 101 or other signs of infection. [phone] or [phone] Specific instructions:  Follow-up Information    Kerin Cecchi, MD Follow up.   Specialty:  Ophthalmology Why:  March 22 at 10:45am Contact information: [address]   [place] [id number] [phone]

## 2016-04-02 ENCOUNTER — Telehealth: Payer: Self-pay | Admitting: Urology

## 2016-04-02 NOTE — Telephone Encounter (Signed)
Patient called the office this morning.  [facility].  Dr. Sherron MondayMacDiarmid would like to do a procedure, but she has reservations and [facility]  She can be reached at [phone].

## 2016-04-02 NOTE — Telephone Encounter (Signed)
I spoke with the patient.  She will keep her appointments for UDS and cystoscopy.

## 2016-04-11 ENCOUNTER — Other Ambulatory Visit: Payer: Self-pay | Admitting: Urology

## 2016-04-16 ENCOUNTER — Ambulatory Visit: Payer: Medicare Other | Admitting: Urology

## 2016-04-16 ENCOUNTER — Encounter: Payer: Self-pay | Admitting: Urology

## 2016-04-16 VITALS — BP 125/83 | HR 90 | Ht 65.0 in | Wt 178.6 lb

## 2016-04-16 DIAGNOSIS — N39498 Other specified urinary incontinence: Secondary | ICD-10-CM | POA: Diagnosis not present

## 2016-04-16 DIAGNOSIS — N3946 Mixed incontinence: Secondary | ICD-10-CM

## 2016-04-16 LAB — MICROSCOPIC EXAMINATION
BACTERIA UA: NONE SEEN
EPITHELIAL CELLS (NON RENAL): NONE SEEN /HPF (ref 0–10)
RBC, UA: NONE SEEN /hpf (ref 0–?)

## 2016-04-16 LAB — URINALYSIS, COMPLETE
BILIRUBIN UA: NEGATIVE
Glucose, UA: NEGATIVE
KETONES UA: NEGATIVE
Leukocytes, UA: NEGATIVE
Nitrite, UA: NEGATIVE
PH UA: 6.5 (ref 5.0–7.5)
PROTEIN UA: NEGATIVE
RBC UA: NEGATIVE
SPEC GRAV UA: 1.015 (ref 1.005–1.030)
UUROB: 0.2 mg/dL (ref 0.2–1.0)

## 2016-04-16 MED ORDER — SOLIFENACIN SUCCINATE 5 MG PO TABS: 5.0000 mg | ORAL_TABLET | Freq: Every day | ORAL | 11 refills | Status: DC

## 2016-04-16 MED ORDER — LIDOCAINE HCL 2 % EX GEL
1.0000 | Freq: Once | CUTANEOUS | Status: AC
Start: 2016-04-16 — End: 2016-04-16
  Administered 2016-04-16: 1 via URETHRAL

## 2016-04-16 MED ORDER — CIPROFLOXACIN HCL 500 MG PO TABS
500.0000 mg | ORAL_TABLET | Freq: Once | ORAL | Status: AC
  Administered 2016-04-16: 500 mg via ORAL

## 2016-04-16 NOTE — Progress Notes (Signed)
04/16/2016 9:48 AM   Alyssa Mcclure 09-04-1950 [id number]  Referring provider: [name], MD [address] [facility] [place], [place] [address]  Chief Complaint  Patient presents with  . Cysto    mixed incontinence     HPI: I was consulted to assess the patient's urinary incontinence worsening over years. She describes prolapse surgery with the sling likely by a gynecologist in [place]. She describes primarily stress incontinence prior to that surgery. She thinks her incontinence has worsened since  She currently leaks with coughing and sneezing and bending and lifting. 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 Standing she can leak high volumes without any awareness. She has no bedwetting. She can soak 3-5 pads per day  She voids every 2 hours and gets up 2-3 times to urinate. Flow good.   She describes vaginal splinting for bowel movements; constipation  On pelvic examination the patient had a fixed bladder neck and a negative cough test. I could feel a ridge bilaterally at the urethrovesical angle and she likely has had a trans-obturator tape procedure. I could not see or feel any definitive extrusion. She had a very high milder grade 2 cystocele that did not descend. She had very reasonable vaginal length. She had a small grade 1 rectocele and mild perineal bulging. She pointed to the perineal area where she presses and does her splinting maneuver  The patient has mixed incontinence and likely is having unstable bladder contractions with high volume leakage on times not associated with awareness. She has frequency and nighttime frequency. She is splinting for bowel movements  She was a partial responder to the beta 3 agonist at 25 mg.  UDS: The patient's bladder capacity was 525 mL. She had low pressure bladder overactivity and would leak a small amount. She was triggering. She did not  have stress incontinence with a Valsalva pressure of 113 cm of water. She did generate a detrusor contraction at 23 cm of water. Maximum flow was 10 mils per second. Her residual was 125 milliliters. There was rectal line artifact. EMG was not working well during the study. Bladder neck descended 1 cm. Bladder was trabeculated  Cystoscopy: Bladder mucosa and trigone were normal. There was no stitch or foreign body or carcinoma. There was no cystitis. There was no sling in the urethra. Urethral axis was normal  Previous post void residual: 26 mL   PMH: Past Medical History:  Diagnosis Date  . Arthritis   . Depression   . GERD (gastroesophageal reflux disease)   . H/O Bell's palsy   . Heart murmur   . Hepatitis   . HLD (hyperlipidemia)   . HOH (hard of hearing)   . Hypertension     Surgical History: Past Surgical History:  Procedure Laterality Date  . ABDOMINAL HYSTERECTOMY    . BILATERAL CARPAL TUNNEL RELEASE Bilateral   . bladder lift  2011  . CATARACT EXTRACTION W/PHACO Right 05/09/2015   Procedure: CATARACT EXTRACTION PHACO AND INTRAOCULAR LENS PLACEMENT (IOC);  Surgeon: Sallee Lange, MD;  Location: [facility] ORS;  Service: Ophthalmology;  Laterality: Right;  [place] 45.1 AP% 21.0 CDE 19.36 Fluid Pack Lot # [id number]  . CATARACT EXTRACTION W/PHACO Left 03/28/2016   Procedure: CATARACT EXTRACTION PHACO AND INTRAOCULAR LENS PLACEMENT (IOC);  Surgeon: Sallee Lange, MD;  Location: [facility] ORS;  Service: Ophthalmology;  Laterality: Left;  [place] 01:05 AP% 21.3 CDE 24.21 fluid pack lot #  [id number]  . CESAREAN SECTION    . HERNIA REPAIR    . KNEE ARTHROSCOPY Bilateral   . ROTATOR CUFF REPAIR Bilateral     Home Medications:  Allergies as of 04/16/2016      Reactions   Penicillins Other (See Comments), Hives   Other reaction(s): Other (See Comments) Has patient had a PCN reaction causing immediate rash, facial/tongue/throat swelling, SOB or lightheadedness with hypotension: No Has patient  had a PCN reaction causing severe rash involving mucus membranes or skin necrosis: No Has patient had a PCN reaction that required hospitalization No Has patient had a PCN reaction occurring within the last 10 years: No If all of the above answers are "NO", then may proceed with Cephalosporin use. blotches   Losartan Cough   Other reaction(s): Cough (ALLERGY/intolerance) Patient states still on Losartan miscategorized   Statins Other (See Comments)   Other reaction(s): Other (See Comments) Leg cramps   Latex Rash   itching      Medication List       Accurate as of 04/16/16  9:48 AM. Always use your most recent med list.          aspirin EC 81 MG tablet Take 81 mg by mouth daily.   DULoxetine 60 MG capsule Commonly known as:  CYMBALTA Take 60 mg by mouth daily.   hydrochlorothiazide 25 MG tablet Commonly known as:  HYDRODIURIL Take 25 mg by mouth daily.   losartan 100 MG tablet Commonly known as:  COZAAR Take 100 mg by mouth daily.   naproxen sodium 220 MG tablet Commonly known as:  ANAPROX Take 220 mg by mouth daily as needed (pain).   pantoprazole 40 MG tablet Commonly known as:  PROTONIX Take 40 mg by mouth 2 (two) times daily.   traZODone 50 MG tablet Commonly known as:  DESYREL Take 50 mg by mouth at bedtime as needed for sleep.       Allergies:  Allergies  Allergen Reactions  . Penicillins Other (See Comments) and Hives    Other reaction(s): Other (See Comments) Has patient had a PCN reaction causing immediate rash, facial/tongue/throat swelling, SOB or lightheadedness with hypotension: No Has patient had a PCN reaction causing severe rash involving mucus membranes or skin necrosis: No Has patient had a PCN reaction that required hospitalization No Has patient had a PCN reaction occurring within the last 10 years: No If all of the above answers are "NO", then may proceed with Cephalosporin use.  blotches  . Losartan Cough    Other reaction(s):  Cough (ALLERGY/intolerance) Patient states still on Losartan miscategorized  . Statins Other (See Comments)    Other reaction(s): Other (See Comments) Leg cramps  . Latex Rash    itching    Family History: Family History  Problem Relation Age of Onset  . Breast cancer Daughter 36  . Breast cancer Paternal Aunt   . Breast cancer Cousin     mat cousin  . Prostate cancer Neg Hx   . Kidney cancer Neg Hx   . Bladder Cancer Neg Hx     Social History:  reports that she has quit smoking. Her smoking use included Cigarettes. She has never used smokeless tobacco. She reports that she drinks alcohol. She reports that she does not use drugs.  ROS:  Physical Exam: BP 125/83   Pulse 90   Ht 5\' 5"  (1.651 m)   Wt 178 lb 9.6 oz (81 kg)   BMI 29.72 kg/m   Constitutional:  Alert and oriented, No acute distress.   Laboratory Data: Lab Results  Component Value Date   WBC 7.0 07/06/2009   HGB 11.0 REPEATED TO VERIFY (L) 07/06/2009   HCT 31.2 (L) 07/06/2009   MCV 92.6 07/06/2009   PLT  07/06/2009    152 DELTA CHECK NOTED SPECIMEN CHECKED FOR CLOTS REPEATED TO VERIFY    Lab Results  Component Value Date   CREATININE 0.68 07/04/2009     Urinalysis    Component Value Date/Time   APPEARANCEUR Clear 02/21/2016 1426   GLUCOSEU Negative 02/21/2016 1426   BILIRUBINUR Negative 02/21/2016 1426   PROTEINUR Negative 02/21/2016 1426   NITRITE Negative 02/21/2016 1426   LEUKOCYTESUR Negative 02/21/2016 1426    Pertinent Imaging: none  Assessment & Plan:  The patient primarily has an overactive bladder. She was started on Vesicare 5 mg samples and prescription. The role of physical therapy was discussed  1. Other urinary incontinence 2. Urgency incontinence 3. Urinary frequency   No Follow-up on file.  Martina Sinner, MD  [facility] [address], [address] [place], [place]  [address] [phone]

## 2016-05-14 ENCOUNTER — Ambulatory Visit: Payer: Medicare Other

## 2016-06-06 ENCOUNTER — Encounter: Payer: Self-pay | Admitting: *Deleted

## 2016-06-07 NOTE — Discharge Instructions (Signed)
INSTRUCTIONS FOLLOWING OCULOPLASTIC SURGERY °AMY M. FOWLER, MD ° °AFTER YOUR EYE SURGERY, THER ARE MANY THINGS THWIHC YOU, THE PATIENT, CAN DO TO ASSURE THE BEST POSSIBLE RESULT FROM YOUR OPERATION.  THIS SHEET SHOULD BE REFERRED TO WHENEVER QUESTIONS ARISE.  IF THERE ARE ANY QUESTIONS NOT ANSWERED HERE, DO NOT HESITATE TO CALL OUR OFFICE AT [phone] OR [phone].  THERE IS ALWAYS [facility] AVAILABLE TO CALL IF QUESTIONS OR PROBLEMS ARISE. ° °VISION: Your vision may be blurred and out of focus after surgery until you are able to stop using your ointment, swelling resolves and your eye(s) heal. This may take 1 to 2 weeks at the least.  If your vision becomes gradually more dim or dark, this is not normal and you need to call our office immediately. ° °EYE CARE: For the first 48 hours after surgery, use ice packs frequently - “20 minutes on, 20 minutes off” - to help reduce swelling and bruising.  Small bags of frozen peas or corn make good ice packs along with cloths soaked in ice water.  If you are wearing a patch or other type of dressing following surgery, keep this on for the amount of time specified by your doctor.  For the first week following surgery, you will need to treat your stitches with great care.  If is OK to shower, but take care to not allow soapy water to run into your eye(s) to help reduce changes of infection.  You may gently clean the eyelashes and around the eye(s) with cotton balls and sterile water, BUT DO NOT RUB THE STITCHES VIGOROUSLY.  Keeping your stitches moist with ointment will help promote healing with minimal scar formation. ° °ACTIVITY: When you leave the surgery center, you should go home, rest and be inactive.  The eye(s) may feel scratchy and keeping the eyes closed will allow for faster healing.  The first week following surgery, avoid straining (anything making the face turn red) or lifting over 20 pounds.  Additionally, avoid bending which causes your head to go below  your waist.  Using your eyes will NOT harm them, so feel free to read, watch television, use the computer, etc as desired.  Driving depends on each individual, so check with your doctor if you have questions about driving. ° °MEDICATIONS:  You will be given a prescription for an ointment to use 4 times a day on your stitches.  You can use the ointment in your eyes if they feel scratchy or irritated.  If you eyelid(s) don’t close completely when you sleep, put some ointment in your eyes before bedtime. ° °EMERGENCY: If you experience SEVERE EYE PAIN OR HEADACHE UNRELIEVED BY TYLENOL OR PERCOCET, NAUSEA OR VOMITING, WORSENING REDNESS, OR WORSENING VISION (ESPECIALLY VISION THAT WA INITIALLY BETTER) CALL [phone] OR [phone] DURING BUSINESS HOURS OR AFTER HOURS. ° °General Anesthesia, Adult, Care After °These instructions provide you with information about caring for yourself after your procedure. Your health care provider may also give you more specific instructions. Your treatment has been planned according to current medical practices, but problems sometimes occur. Call your health care provider if you have any problems or questions after your procedure. °What can I expect after the procedure? °After the procedure, it is common to have: °· Vomiting. °· A sore throat. °· Mental slowness. ° °It is common to feel: °· Nauseous. °· Cold or shivery. °· Sleepy. °· Tired. °· Sore or achy, even in parts of your body where you did not have surgery. ° °  Follow these instructions at home: °For at least 24 hours after the procedure: °· Do not: °? Participate in activities where you could fall or become injured. °? Drive. °? Use heavy machinery. °? Drink alcohol. °? Take sleeping pills or medicines that cause drowsiness. °? Make important decisions or sign legal documents. °? Take care of children on your own. °· Rest. °Eating and drinking °· If you vomit, drink water, juice, or soup when you can drink without  vomiting. °· Drink enough fluid to keep your urine clear or pale yellow. °· Make sure you have little or no nausea before eating solid foods. °· Follow the diet recommended by your health care provider. °General instructions °· Have a responsible adult stay with you until you are awake and alert. °· Return to your normal activities as told by your health care provider. Ask your health care provider what activities are safe for you. °· Take over-the-counter and prescription medicines only as told by your health care provider. °· If you smoke, do not smoke without supervision. °· Keep all follow-up visits as told by your health care provider. This is important. °Contact a health care provider if: °· You continue to have nausea or vomiting at home, and medicines are not helpful. °· You cannot drink fluids or start eating again. °· You cannot urinate after 8-12 hours. °· You develop a skin rash. °· You have fever. °· You have increasing redness at the site of your procedure. °Get help right away if: °· You have difficulty breathing. °· You have chest pain. °· You have unexpected bleeding. °· You feel that you are having a life-threatening or urgent problem. °This information is not intended to replace advice given to you by your health care provider. Make sure you discuss any questions you have with your health care provider. °Document Released: 04/02/2000 Document Revised: 05/30/2015 Document Reviewed: 12/09/2014 °Elsevier Interactive Patient Education © 2018 [employer] ° °

## 2016-06-12 ENCOUNTER — Ambulatory Visit: Payer: Medicare Other | Admitting: Anesthesiology

## 2016-06-12 ENCOUNTER — Ambulatory Visit
Admission: RE | Admit: 2016-06-12 | Discharge: 2016-06-12 | Disposition: A | Discharge status: Home / Self Care | Payer: Medicare Other | Source: Ambulatory Visit | Attending: Ophthalmology | Admitting: Ophthalmology

## 2016-06-12 ENCOUNTER — Encounter
Admission: RE | Disposition: A | Discharge status: Home / Self Care | Payer: Self-pay | Source: Ambulatory Visit | Attending: Ophthalmology

## 2016-06-12 DIAGNOSIS — I10 Essential (primary) hypertension: Secondary | ICD-10-CM | POA: Diagnosis not present

## 2016-06-12 DIAGNOSIS — Z9842 Cataract extraction status, left eye: Secondary | ICD-10-CM | POA: Diagnosis not present

## 2016-06-12 DIAGNOSIS — Z791 Long term (current) use of non-steroidal anti-inflammatories (NSAID): Secondary | ICD-10-CM | POA: Insufficient documentation

## 2016-06-12 DIAGNOSIS — Z880 Allergy status to penicillin: Secondary | ICD-10-CM | POA: Diagnosis not present

## 2016-06-12 DIAGNOSIS — R05 Cough: Secondary | ICD-10-CM | POA: Diagnosis not present

## 2016-06-12 DIAGNOSIS — Z888 Allergy status to other drugs, medicaments and biological substances status: Secondary | ICD-10-CM | POA: Insufficient documentation

## 2016-06-12 DIAGNOSIS — Z9889 Other specified postprocedural states: Secondary | ICD-10-CM | POA: Diagnosis not present

## 2016-06-12 DIAGNOSIS — Z87891 Personal history of nicotine dependence: Secondary | ICD-10-CM | POA: Diagnosis not present

## 2016-06-12 DIAGNOSIS — Z9841 Cataract extraction status, right eye: Secondary | ICD-10-CM | POA: Insufficient documentation

## 2016-06-12 DIAGNOSIS — K219 Gastro-esophageal reflux disease without esophagitis: Secondary | ICD-10-CM | POA: Insufficient documentation

## 2016-06-12 DIAGNOSIS — Z79899 Other long term (current) drug therapy: Secondary | ICD-10-CM | POA: Insufficient documentation

## 2016-06-12 DIAGNOSIS — F329 Major depressive disorder, single episode, unspecified: Secondary | ICD-10-CM | POA: Diagnosis not present

## 2016-06-12 DIAGNOSIS — H02831 Dermatochalasis of right upper eyelid: Secondary | ICD-10-CM | POA: Diagnosis present

## 2016-06-12 DIAGNOSIS — Z91048 Other nonmedicinal substance allergy status: Secondary | ICD-10-CM | POA: Insufficient documentation

## 2016-06-12 DIAGNOSIS — Z7982 Long term (current) use of aspirin: Secondary | ICD-10-CM | POA: Diagnosis not present

## 2016-06-12 DIAGNOSIS — Z90710 Acquired absence of both cervix and uterus: Secondary | ICD-10-CM | POA: Insufficient documentation

## 2016-06-12 DIAGNOSIS — H02834 Dermatochalasis of left upper eyelid: Secondary | ICD-10-CM | POA: Insufficient documentation

## 2016-06-12 HISTORY — PX: BROW LIFT: SHX178

## 2016-06-12 SURGERY — BLEPHAROPLASTY
Anesthesia: Monitor Anesthesia Care | Site: Eye | Laterality: Bilateral | Wound class: Clean

## 2016-06-12 MED ORDER — OXYCODONE-ACETAMINOPHEN 5-325 MG PO TABS: 1.0000 | ORAL_TABLET | ORAL | 0 refills | Status: DC | PRN

## 2016-06-12 MED ORDER — ACETAMINOPHEN 325 MG PO TABS
650.0000 mg | ORAL_TABLET | Freq: Once | ORAL | Status: AC
  Administered 2016-06-12: 650 mg via ORAL

## 2016-06-12 MED ORDER — MIDAZOLAM HCL 2 MG/2ML IJ SOLN
INTRAMUSCULAR | Status: DC | PRN
  Administered 2016-06-12: 2 mg via INTRAVENOUS

## 2016-06-12 MED ORDER — FENTANYL CITRATE (PF) 100 MCG/2ML IJ SOLN: 25.0000 ug | INTRAMUSCULAR | Status: DC | PRN

## 2016-06-12 MED ORDER — LACTATED RINGERS IV SOLN
INTRAVENOUS | Status: DC
  Administered 2016-06-12: 09:00:00 via INTRAVENOUS

## 2016-06-12 MED ORDER — ONDANSETRON HCL 4 MG/2ML IJ SOLN: 4.0000 mg | Freq: Once | INTRAMUSCULAR | Status: DC | PRN

## 2016-06-12 MED ORDER — LIDOCAINE HCL (CARDIAC) 20 MG/ML IV SOLN
INTRAVENOUS | Status: DC | PRN
  Administered 2016-06-12: 40 mg via INTRAVENOUS

## 2016-06-12 MED ORDER — ALFENTANIL 500 MCG/ML IJ INJ
INJECTION | INTRAVENOUS | Status: DC | PRN
  Administered 2016-06-12: 300 ug via INTRAVENOUS
  Administered 2016-06-12: 700 ug via INTRAVENOUS

## 2016-06-12 MED ORDER — ERYTHROMYCIN 5 MG/GM OP OINT
TOPICAL_OINTMENT | OPHTHALMIC | Status: DC | PRN
  Administered 2016-06-12: 1

## 2016-06-12 MED ORDER — TETRACAINE HCL 0.5 % OP SOLN
OPHTHALMIC | Status: DC | PRN
  Administered 2016-06-12: 2 [drp] via OPHTHALMIC

## 2016-06-12 MED ORDER — OXYCODONE HCL 5 MG/5ML PO SOLN: 5.0000 mg | Freq: Once | ORAL | Status: DC | PRN

## 2016-06-12 MED ORDER — ERYTHROMYCIN 5 MG/GM OP OINT: TOPICAL_OINTMENT | OPHTHALMIC | 3 refills | Status: DC

## 2016-06-12 MED ORDER — OXYCODONE HCL 5 MG PO TABS: 5.0000 mg | ORAL_TABLET | Freq: Once | ORAL | Status: DC | PRN

## 2016-06-12 MED ORDER — PROPOFOL 500 MG/50ML IV EMUL
INTRAVENOUS | Status: DC | PRN
  Administered 2016-06-12: 25 ug/kg/min via INTRAVENOUS

## 2016-06-12 MED ORDER — LIDOCAINE-EPINEPHRINE 2 %-1:100000 IJ SOLN
INTRAMUSCULAR | Status: DC | PRN
  Administered 2016-06-12: 3 mL via OPHTHALMIC

## 2016-06-12 MED ORDER — BSS IO SOLN
INTRAOCULAR | Status: DC | PRN
  Administered 2016-06-12: 15 mL

## 2016-06-12 SURGICAL SUPPLY — 35 items
APPLICATOR COTTON TIP WD 3 STR (MISCELLANEOUS) ×2 IMPLANT
BLADE SURG 15 STRL LF DISP TIS (BLADE) ×1 IMPLANT
BLADE SURG 15 STRL SS (BLADE) ×1
CORD BIP STRL DISP 12FT (MISCELLANEOUS) ×2 IMPLANT
DRAPE HEAD BAR (DRAPES) ×2 IMPLANT
GAUZE SPONGE 4X4 12PLY STRL (GAUZE/BANDAGES/DRESSINGS) ×2 IMPLANT
GAUZE SPONGE NON-WVN 2X2 STRL (MISCELLANEOUS) ×10 IMPLANT
GLOVE SURG LX 7.0 MICRO (GLOVE) ×2
GLOVE SURG LX STRL 7.0 MICRO (GLOVE) ×2 IMPLANT
MARKER SKIN XFINE TIP W/RULER (MISCELLANEOUS) ×2 IMPLANT
NEEDLE FILTER BLUNT 18X 1/2SAF (NEEDLE) ×1
NEEDLE FILTER BLUNT 18X1 1/2 (NEEDLE) ×1 IMPLANT
NEEDLE HYPO 30X.5 LL (NEEDLE) ×4 IMPLANT
PACK DRAPE NASAL/ENT (PACKS) ×2 IMPLANT
SOL PREP PVP 2OZ (MISCELLANEOUS) ×2
SOLUTION PREP PVP 2OZ (MISCELLANEOUS) ×1 IMPLANT
SPONGE VERSALON 2X2 STRL (MISCELLANEOUS) ×10
SUT CHROMIC 4-0 (SUTURE)
SUT CHROMIC 4-0 M2 12X2 ARM (SUTURE)
SUT CHROMIC 5 0 P 3 (SUTURE) IMPLANT
SUT ETHILON 4 0 CL P 3 (SUTURE) IMPLANT
SUT MERSILENE 4-0 S-2 (SUTURE) IMPLANT
SUT PLAIN GUT (SUTURE) ×2 IMPLANT
SUT PROLENE 5 0 P 3 (SUTURE) IMPLANT
SUT PROLENE 6 0 P 1 18 (SUTURE) IMPLANT
SUT SILK 4 0 G 3 (SUTURE) IMPLANT
SUT VIC AB 5-0 P-3 18X BRD (SUTURE) IMPLANT
SUT VIC AB 5-0 P3 18 (SUTURE)
SUT VICRYL 6-0  S14 CTD (SUTURE)
SUT VICRYL 6-0 S14 CTD (SUTURE) IMPLANT
SUT VICRYL 7 0 TG140 8 (SUTURE) IMPLANT
SUTURE CHRMC 4-0 M2 12X2 ARM (SUTURE) IMPLANT
SYR 3ML LL SCALE MARK (SYRINGE) ×2 IMPLANT
SYRINGE 10CC LL (SYRINGE) ×2 IMPLANT
WATER STERILE IRR 250ML POUR (IV SOLUTION) ×2 IMPLANT

## 2016-06-12 NOTE — Anesthesia Postprocedure Evaluation (Signed)
Anesthesia Post Note  Patient: Alyssa Mcclure  Procedure(s) Performed: Procedure(s) (LRB): BLEPHAROPLASTY UPPER EYELID WITH EXCESS SKIN (Bilateral)  Patient location during evaluation: PACU Anesthesia Type: MAC Level of consciousness: awake and alert Pain management: pain level controlled Vital Signs Assessment: post-procedure vital signs reviewed and stable Respiratory status: spontaneous breathing, nonlabored ventilation, respiratory function stable and patient connected to nasal cannula oxygen Cardiovascular status: stable and blood pressure returned to baseline Anesthetic complications: no    Alisa Graff

## 2016-06-12 NOTE — Op Note (Signed)
Preoperative Diagnosis:  Visually significant dermatochalasis bilateral Upper Eyelid(s)  Postoperative Diagnosis:  Same.  Procedure(s) Performed:   Upper eyelid blepharoplasty with excess skin excision  bilateral Upper Eyelid(s)  Teaching Surgeon: Philis Pique. Vickki Muff, M.D.  Assistants: none  Anesthesia: MAC  Specimens: None.  Estimated Blood Loss: Minimal.  Complications: None.  Operative Findings: None Dictated  Procedure:   Allergies were reviewed and the patient is allergic to Penicillins; Adhesive [tape]; Losartan; and Statins.   After the risks, benefits, complications and alternatives were discussed with the patient, appropriate informed consent was obtained and the patient was brought to the operating suite. The patient was reclined supine and a timeout was conducted.  The patient was then sedated.  Local anesthetic consisting of a 50-50 mixture of 2% lidocaine with epinephrine and 0.75% bupivacaine with added Hylenex was injected subcutaneously to both upper eyelid(s). After adequate local was instilled, the patient was prepped and draped in the usual sterile fashion for eyelid surgery.   Attention was turned to the upper eyelids. A 74m upper eyelid crease incision line was marked with calipers on both upper eyelid(s).  A pinch test was used to estimate the amount of excess skin to remove and this was marked in standard blepharoplasty style fashion. Attention was turned to the  right upper eyelid. A #15 blade was used to open the premarked incision line. A skin only flap was excised and hemostasis was obtained with bipolar cautery.   Attention was then turned to the opposite eyelid where the same procedure was performed in the same manner. Hemostasis was obtained with bipolar cautery throughout. All incisions were then closed with a combination of running and interrupted 6-0 fast absorbing plain suture. The patient tolerated the procedure well.  Erythromycin Ophthalmic ointment  was applied to her incision sites, followed by ice packs. She was taken to the recovery area where she recovered without difficulty.  Post-Op Plan/Instructions:  The patient was instructed to use ice packs frequently for the next 48 hours. She was instructed to use erythromycin ophthalmic ointment on her incisions 4 times a day for the next 12 to 14 days. She was given a prescription for Percocet for pain control should Tylenol not be effective. She was [facility] [place] needed [place].  Bhavik Cabiness M. FVickki Muff M.D. Attending,Ophthalmology

## 2016-06-12 NOTE — H&P (Signed)
See the history and physical completed at [facility] on 05/30/16 and scanned into the chart.

## 2016-06-12 NOTE — Transfer of Care (Signed)
Immediate Anesthesia Transfer of Care Note  Patient: Alyssa Mcclure  Procedure(s) Performed: Procedure(s): BLEPHAROPLASTY UPPER EYELID WITH EXCESS SKIN (Bilateral)  Patient Location: PACU  Anesthesia Type: MAC  Level of Consciousness: awake, alert  and patient cooperative  Airway and Oxygen Therapy: Patient Spontanous Breathing and Patient connected to supplemental oxygen  Post-op Assessment: Post-op Vital signs reviewed, Patient's Cardiovascular Status Stable, Respiratory Function Stable, Patent Airway and No signs of Nausea or vomiting  Post-op Vital Signs: Reviewed and stable  Complications: No apparent anesthesia complications

## 2016-06-12 NOTE — Anesthesia Preprocedure Evaluation (Signed)
Anesthesia Evaluation  Patient identified by MRN, date of birth, ID band Patient awake    Reviewed: Allergy & Precautions, H&P , NPO status , Patient's Chart, lab work & pertinent test results, reviewed documented beta blocker date and time   Airway Mallampati: II  TM Distance: >3 FB Neck ROM: full    Dental no notable dental hx.    Pulmonary neg pulmonary ROS, former smoker,    Pulmonary exam normal breath sounds clear to auscultation       Cardiovascular Exercise Tolerance: Good hypertension,  Rhythm:regular Rate:Normal     Neuro/Psych negative neurological ROS  negative psych ROS   GI/Hepatic Neg liver ROS, GERD  ,  Endo/Other  negative endocrine ROS  Renal/GU negative Renal ROS  negative genitourinary   Musculoskeletal   Abdominal   Peds  Hematology negative hematology ROS (+)   Anesthesia Other Findings   Reproductive/Obstetrics negative OB ROS                             Anesthesia Physical Anesthesia Plan  ASA: II  Anesthesia Plan: MAC   Post-op Pain Management:    Induction:   PONV Risk Score and Plan:   Airway Management Planned:   Additional Equipment:   Intra-op Plan:   Post-operative Plan:   Informed Consent: I have reviewed the patients History and Physical, chart, labs and discussed the procedure including the risks, benefits and alternatives for the proposed anesthesia with the patient or authorized representative who has indicated his/her understanding and acceptance.   Dental Advisory Given  Plan Discussed with: CRNA  Anesthesia Plan Comments:         Anesthesia Quick Evaluation

## 2016-06-12 NOTE — Anesthesia Procedure Notes (Signed)
Procedure Name: MAC Performed by: Mayme Genta Pre-anesthesia Checklist: Patient identified, Emergency Drugs available, Suction available, Timeout performed and Patient being monitored Patient Re-evaluated:Patient Re-evaluated prior to inductionOxygen Delivery Method: Nasal cannula Placement Confirmation: positive ETCO2

## 2016-06-12 NOTE — Interval H&P Note (Signed)
History and Physical Interval Note:  06/12/2016 10:21 AM  Alyssa Mcclure  has presented today for surgery, with the diagnosis of H02.831  H02.834  DERNATOCHALASIS  The various methods of treatment have been discussed with the patient and family. After consideration of risks, benefits and other options for treatment, the patient has consented to  Procedure(s): BLEPHAROPLASTY UPPER EYELID WITH EXCESS SKIN (Bilateral) as a surgical intervention .  The patient's history has been reviewed, patient examined, no change in status, stable for surgery.  I have reviewed the patient's chart and labs.  Questions were answered to the patient's satisfaction.     Ether GriffinsFowler, Amy M

## 2016-06-13 ENCOUNTER — Encounter: Payer: Self-pay | Admitting: Ophthalmology

## 2017-02-18 ENCOUNTER — Other Ambulatory Visit: Payer: Self-pay | Admitting: Physician Assistant

## 2017-02-18 DIAGNOSIS — Z1231 Encounter for screening mammogram for malignant neoplasm of breast: Secondary | ICD-10-CM

## 2017-02-21 ENCOUNTER — Ambulatory Visit
Admission: RE | Admit: 2017-02-21 | Discharge: 2017-02-21 | Disposition: A | Discharge status: Home / Self Care | Payer: Medicare Other | Source: Ambulatory Visit | Attending: Physician Assistant | Admitting: Physician Assistant

## 2017-02-21 DIAGNOSIS — Z1231 Encounter for screening mammogram for malignant neoplasm of breast: Secondary | ICD-10-CM | POA: Insufficient documentation

## 2017-02-28 IMAGING — RF DG ESOPHAGUS
14 of 15 series · 14 of 15 positions shown · non-contrast
Comparison: None.

CLINICAL DATA: Chronic gastroesophageal reflux, episodes of choking

EXAM:
ESOPHOGRAM / BARIUM SWALLOW / BARIUM TABLET STUDY
TECHNIQUE: Combined double contrast and single contrast examination performed
using effervescent crystals, thick barium liquid, and thin barium
liquid. The patient was observed with fluoroscopy swallowing a 13 mm
barium sulphate tablet.
FLUOROSCOPY TIME:  Fluoroscopy Time:  0 minutes, 54 seconds
Number of Acquired Images:  15

[Series 1: fluoro_barium 2fps_bw · 0.17mm/px · 1 of 1 slices shown (1 of 14)]
[im 1/1]
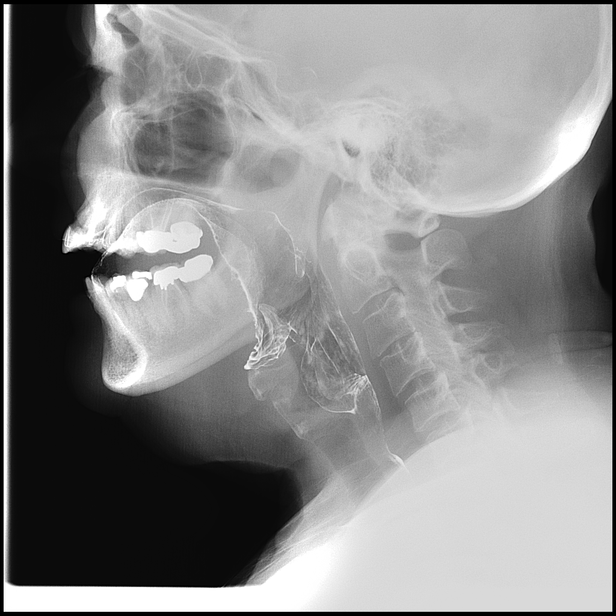

[Series 2: fluoro_barium 2fps_bw · 0.17mm/px · 1 of 1 slices shown (2 of 14)]
[im 1/1]
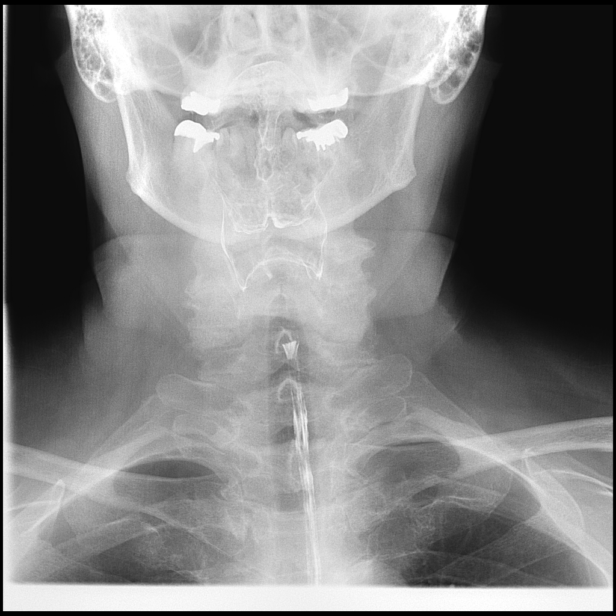

[Series 3: fluoro_barium 2fps_bw · 0.17mm/px · 1 of 1 slices shown (3 of 14)]
[im 1/1]
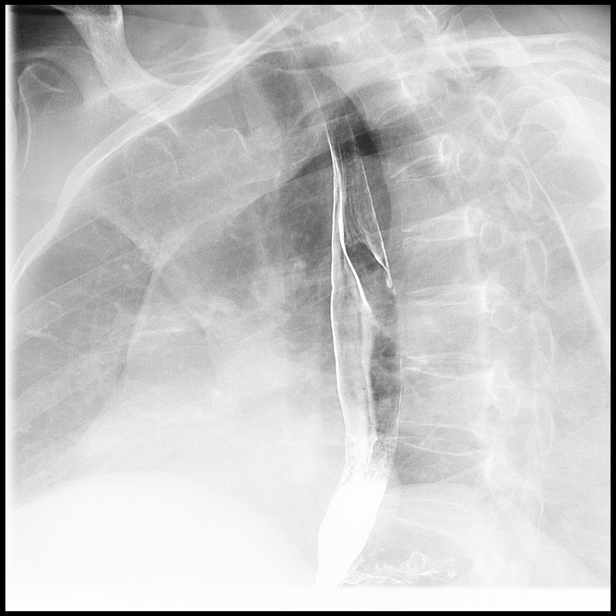

[Series 4: fluoro_barium 2fps_bw · 0.17mm/px · 1 of 1 slices shown (4 of 14)]
[im 1/1]
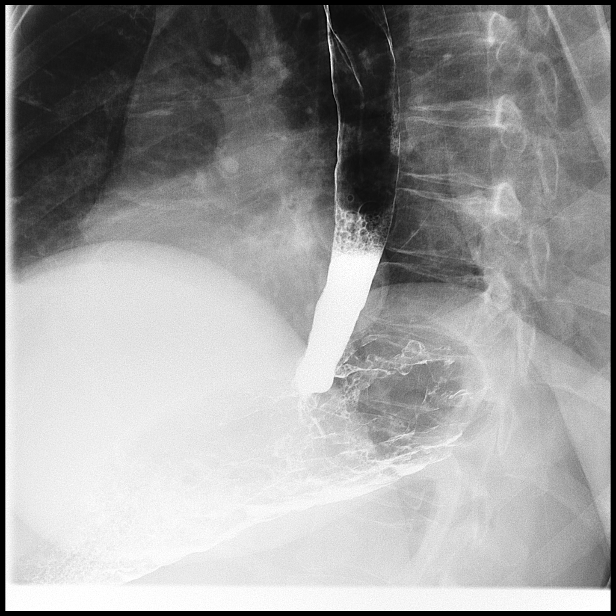

[Series 5: fluoro_barium 2fps_bw · 0.17mm/px · 1 of 1 slices shown (5 of 14)]
[im 1/1]
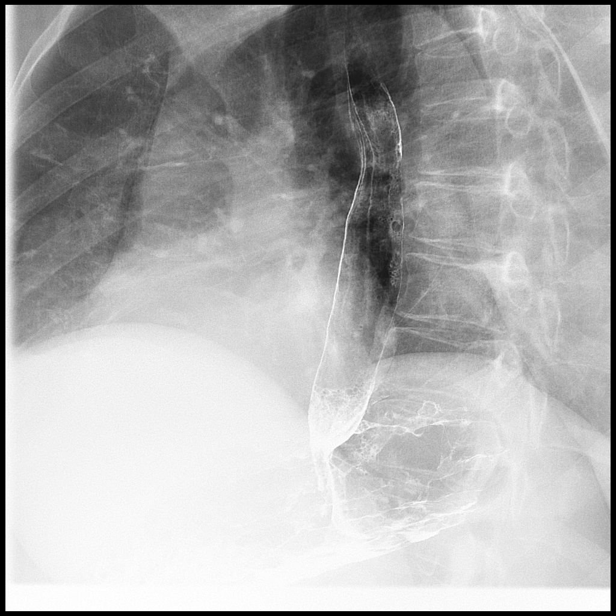

[Series 6: fluoro_barium 2fps_bw · 0.17mm/px · 1 of 1 slices shown (6 of 14)]
[im 1/1]
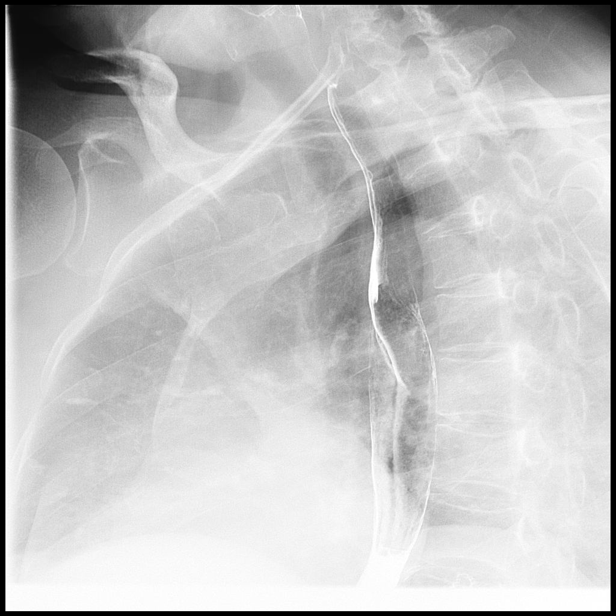

[Series 7: fluoro_barium 2fps_bw · 0.17mm/px · 1 of 1 slices shown (7 of 14)]
[im 1/1]
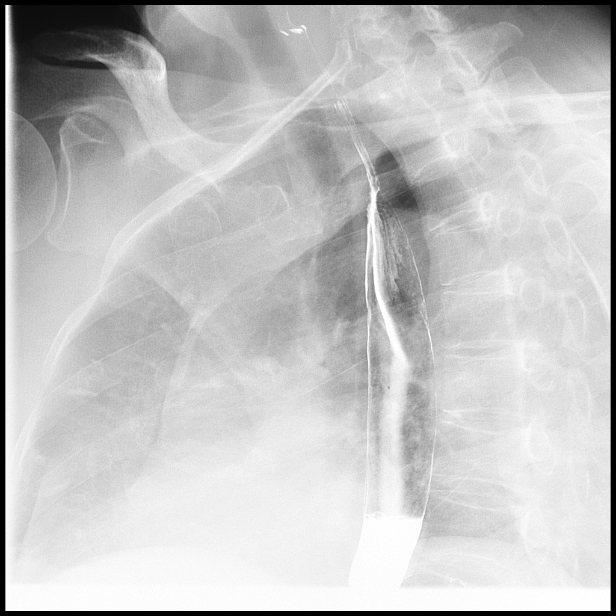

[Series 9: fluoro_barium 2fps_bw · 0.18mm/px · 1 of 1 slices shown (8 of 14)]
[im 1/1]
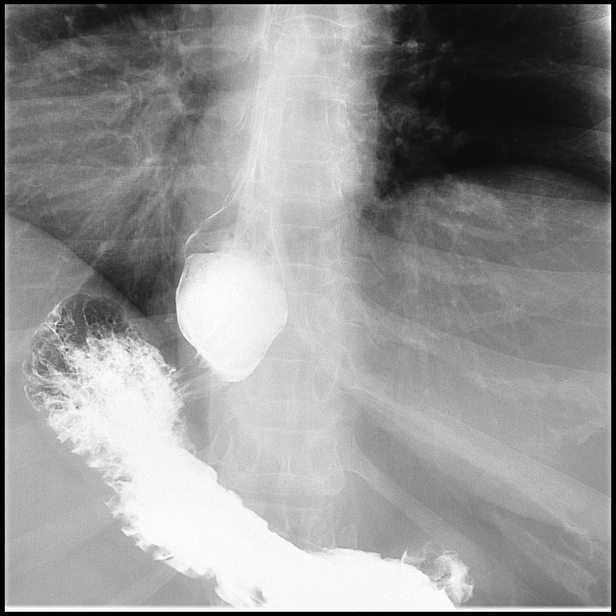

[Series 10: fluoro_barium 2fps_bw · 0.18mm/px · 1 of 1 slices shown (9 of 14)]
[im 1/1]
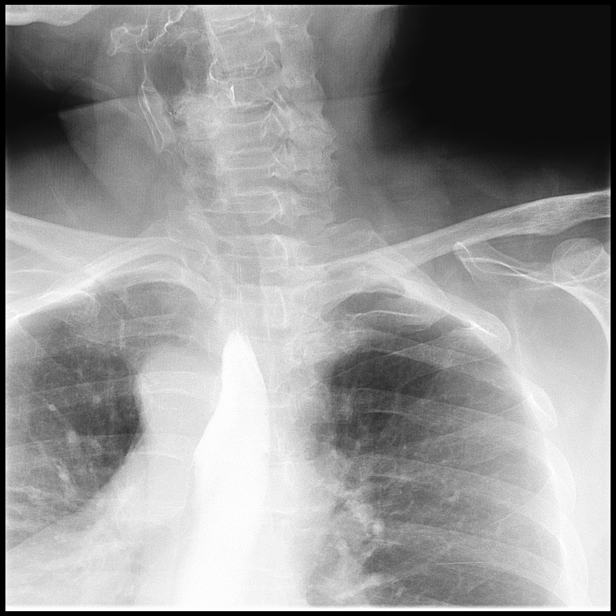

[Series 11: fluoro_barium 2fps_bw · 0.18mm/px · 1 of 1 slices shown (10 of 14)]
[im 1/1]
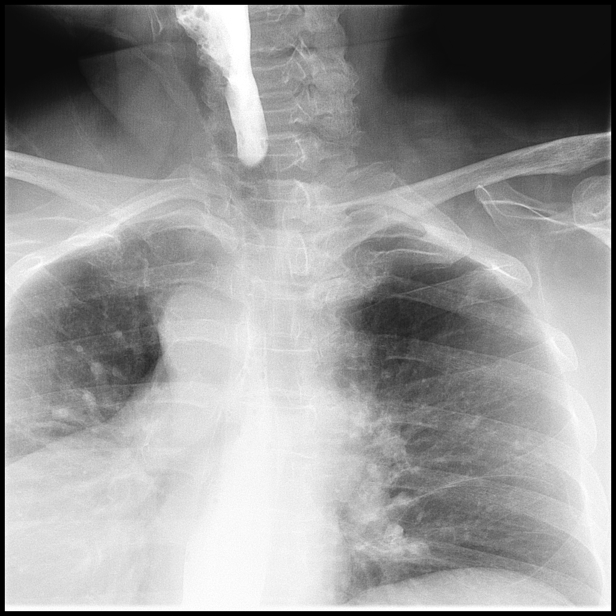

[Series 12: fluoro_barium 2fps_bw · 0.18mm/px · 1 of 1 slices shown (11 of 14)]
[im 1/1]
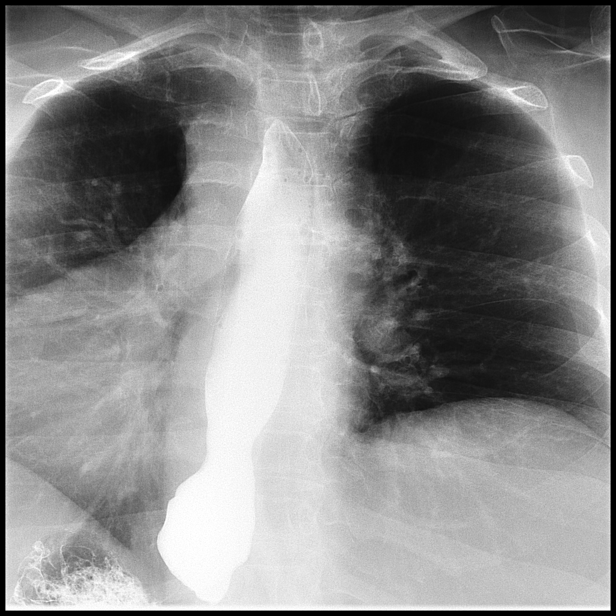

[Series 13: fluoro_barium 2fps_bw · 0.18mm/px · 1 of 1 slices shown (12 of 14)]
[im 1/1]
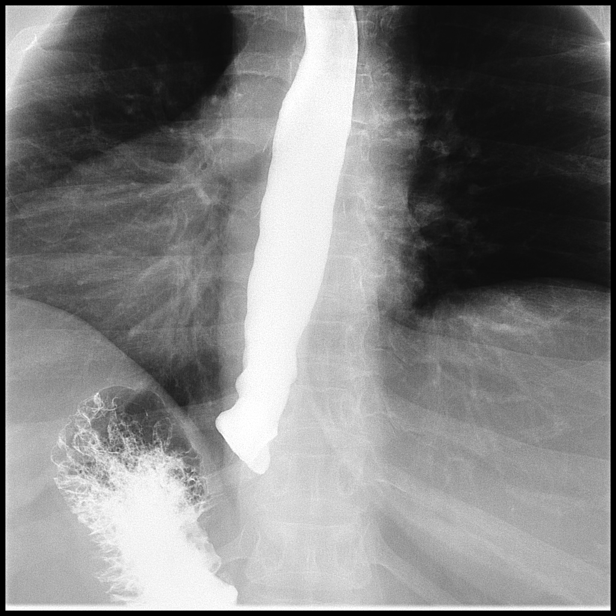

[Series 14: fluoro_barium 2fps_bw · 0.18mm/px · 1 of 1 slices shown (13 of 14)]
[im 1/1]
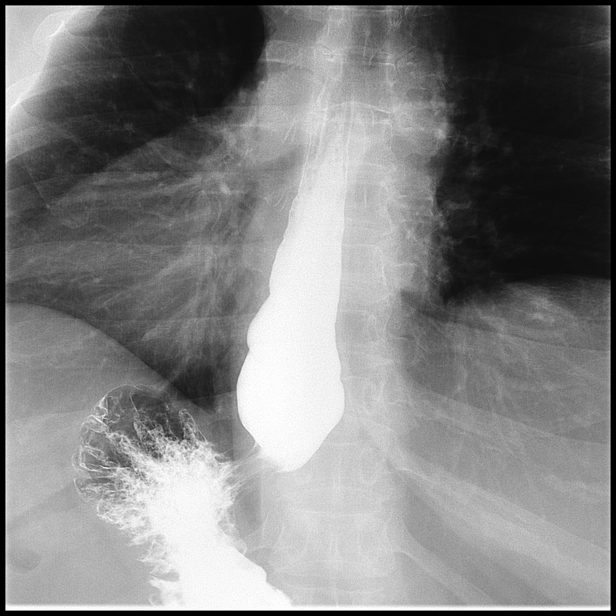

[Series 15: fluoro_barium 2fps_bw · 0.18mm/px · 1 of 1 slices shown (14 of 14)]
[im 1/1]
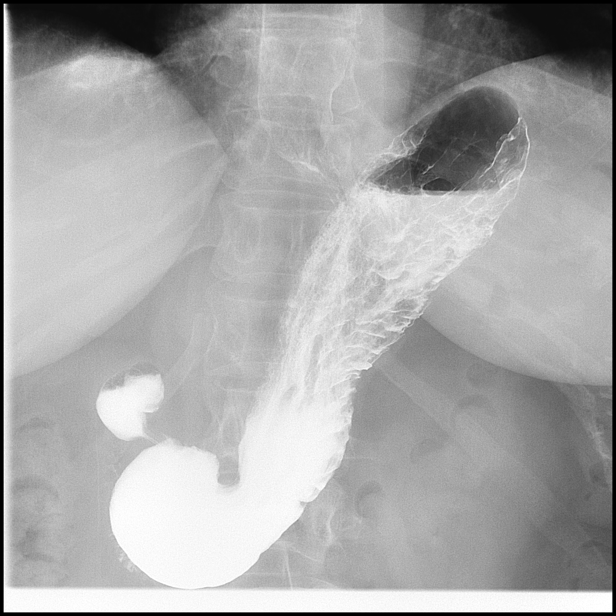

[14 of 15 positions shown; findings below may reference images not displayed]

FINDINGS: The cervical esophagus distended well. There was no laryngeal
penetration of the barium. The thoracic esophagus distended well.
There was a small incompletely reducible hiatal hernia. No
significant reflux was observed. There was no fixed stricture nor
esophagitis. No esophageal ring was observed. The barium tablet
passed promptly from the mouth into the stomach. Survey views of the
stomach were normal.
IMPRESSION: Small incompletely reducible hiatal hernia without evidence of
esophagitis or stricture. No reflux was observed fluoroscopically.
The esophagus is otherwise normal.

## 2018-01-30 ENCOUNTER — Other Ambulatory Visit: Payer: Self-pay | Admitting: Physician Assistant

## 2018-01-30 DIAGNOSIS — Z1231 Encounter for screening mammogram for malignant neoplasm of breast: Secondary | ICD-10-CM

## 2018-02-05 DIAGNOSIS — H26491 Other secondary cataract, right eye: Secondary | ICD-10-CM | POA: Diagnosis not present

## 2018-02-05 DIAGNOSIS — E119 Type 2 diabetes mellitus without complications: Secondary | ICD-10-CM | POA: Diagnosis not present

## 2018-02-06 DIAGNOSIS — M170 Bilateral primary osteoarthritis of knee: Secondary | ICD-10-CM | POA: Diagnosis not present

## 2018-02-09 DIAGNOSIS — M1712 Unilateral primary osteoarthritis, left knee: Secondary | ICD-10-CM | POA: Insufficient documentation

## 2018-02-24 ENCOUNTER — Inpatient Hospital Stay: Admission: RE | Admit: 2018-02-24 | Payer: Medicare Other | Source: Ambulatory Visit

## 2018-05-02 ENCOUNTER — Inpatient Hospital Stay: Admission: RE | Admit: 2018-05-02 | Payer: Self-pay | Source: Ambulatory Visit

## 2018-05-16 ENCOUNTER — Inpatient Hospital Stay: Admit: 2018-05-16 | Payer: Self-pay | Admitting: Orthopedic Surgery

## 2018-05-16 SURGERY — ARTHROPLASTY, KNEE, TOTAL, USING IMAGELESS COMPUTER-ASSISTED NAVIGATION
Anesthesia: Choice | Laterality: Right

## 2018-05-27 DIAGNOSIS — R7303 Prediabetes: Secondary | ICD-10-CM | POA: Diagnosis not present

## 2018-05-27 DIAGNOSIS — E782 Mixed hyperlipidemia: Secondary | ICD-10-CM | POA: Diagnosis not present

## 2018-05-27 DIAGNOSIS — I10 Essential (primary) hypertension: Secondary | ICD-10-CM | POA: Diagnosis not present

## 2018-06-03 DIAGNOSIS — R7303 Prediabetes: Secondary | ICD-10-CM | POA: Diagnosis not present

## 2018-06-03 DIAGNOSIS — I10 Essential (primary) hypertension: Secondary | ICD-10-CM | POA: Diagnosis not present

## 2018-06-03 DIAGNOSIS — R0789 Other chest pain: Secondary | ICD-10-CM | POA: Diagnosis not present

## 2018-06-03 DIAGNOSIS — K1379 Other lesions of oral mucosa: Secondary | ICD-10-CM | POA: Diagnosis not present

## 2018-06-03 DIAGNOSIS — Z23 Encounter for immunization: Secondary | ICD-10-CM | POA: Diagnosis not present

## 2018-06-03 DIAGNOSIS — R0600 Dyspnea, unspecified: Secondary | ICD-10-CM | POA: Diagnosis not present

## 2018-06-03 DIAGNOSIS — E782 Mixed hyperlipidemia: Secondary | ICD-10-CM | POA: Diagnosis not present

## 2018-06-03 DIAGNOSIS — R1310 Dysphagia, unspecified: Secondary | ICD-10-CM | POA: Diagnosis not present

## 2018-06-03 DIAGNOSIS — Z0000 Encounter for general adult medical examination without abnormal findings: Secondary | ICD-10-CM | POA: Diagnosis not present

## 2018-06-09 DIAGNOSIS — R0789 Other chest pain: Secondary | ICD-10-CM | POA: Diagnosis not present

## 2018-06-10 ENCOUNTER — Other Ambulatory Visit: Payer: Self-pay | Admitting: Student

## 2018-06-10 ENCOUNTER — Telehealth: Payer: Self-pay | Admitting: Internal Medicine

## 2018-06-10 DIAGNOSIS — K219 Gastro-esophageal reflux disease without esophagitis: Secondary | ICD-10-CM

## 2018-06-10 DIAGNOSIS — R1310 Dysphagia, unspecified: Secondary | ICD-10-CM

## 2018-06-10 DIAGNOSIS — K2950 Unspecified chronic gastritis without bleeding: Secondary | ICD-10-CM | POA: Diagnosis not present

## 2018-06-10 DIAGNOSIS — R1013 Epigastric pain: Secondary | ICD-10-CM

## 2018-06-10 NOTE — Telephone Encounter (Signed)
Virtual Visit Pre-Appointment Phone Call  "(Name), I am calling you today to discuss your upcoming appointment. We are currently trying to limit exposure to the virus that causes COVID-19 by seeing patients at home rather than in the office."  1. "What is the BEST phone number to call the day of the visit?" - include this in appointment notes  2. "Do you have or have access to (through a family member/friend) a smartphone with video capability that we can use for your visit?" a. If yes - list this number in appt notes as "cell" (if different from BEST phone #) and list the appointment type as a VIDEO visit in appointment notes b. If no - list the appointment type as a PHONE visit in appointment notes  3. Confirm consent - "In the setting of the current Covid19 crisis, you are scheduled for a (phone or video) visit with your provider on (date) at (time).  Just as we do with many in-office visits, in order for you to participate in this visit, we must obtain consent.  If you'd like, I can send this to your mychart (if signed up) or email for you to review.  Otherwise, I can obtain your verbal consent now.  All virtual visits are billed to your insurance company just like a normal visit would be.  By agreeing to a virtual visit, we'd like you to understand that the technology does not allow for your provider to perform an examination, and thus may limit your provider's ability to fully assess your condition. If your provider identifies any concerns that need to be evaluated in person, we will make arrangements to do so.  Finally, though the technology is pretty good, we cannot assure that it will always work on either your or our end, and in the setting of a video visit, we may have to convert it to a phone-only visit.  In either situation, we cannot ensure that we have a secure connection.  Are you willing to proceed?" STAFF: Did the patient verbally acknowledge consent to telehealth visit? Document  YES/NO here: YES  4. Advise patient to be prepared - "Two hours prior to your appointment, go ahead and check your blood pressure, pulse, oxygen saturation, and your weight (if you have the equipment to check those) and write them all down. When your visit starts, your provider will ask you for this information. If you have an Apple Watch or Kardia device, please plan to have heart rate information ready on the day of your appointment. Please have a pen and paper handy nearby the day of the visit as well."  5. Give patient instructions for MyChart download to smartphone OR Doximity/Doxy.me as below if video visit (depending on what platform provider is using)  6. Inform patient they will receive a phone call 15 minutes prior to their appointment time (may be from unknown caller ID) so they should be prepared to answer    TELEPHONE CALL NOTE  Alyssa Mcclure has been deemed a candidate for a follow-up tele-health visit to limit community exposure during the Covid-19 pandemic. I spoke with the patient via phone to ensure availability of phone/video source, confirm preferred email & phone number, and discuss instructions and expectations.  I reminded Alyssa HurtDeborah L Mcclure to be prepared with any vital sign and/or heart rhythm information that could potentially be obtained via home monitoring, at the time of her visit. I reminded Alyssa Mcclure to expect a phone call prior to  her visit.  Sandre Kitty 06/10/2018 3:53 PM   INSTRUCTIONS FOR DOWNLOADING THE MYCHART APP TO SMARTPHONE  - The patient must first make sure to have activated MyChart and know their login information - If Apple, go to [employer] and type in MyChart in the search bar and download the app. If Android, ask patient to go to [employer] and type in [place] in the search bar and download the app. The app is free but as with any other app downloads, their phone may require them to verify saved payment information or Apple/Android  password.  - The patient will need to then log into the app with their MyChart username and password, and select [facility] as their healthcare provider to link the account. When it is time for your visit, go to the MyChart app, find appointments, and click Begin Video Visit. Be sure to Select Allow for your device to access the Microphone and Camera for your visit. You will then be connected, and your provider will be with you shortly.  **If they have any issues connecting, or need assistance please contact MyChart service desk [phone])**  **If using a computer, in order to ensure the best quality for their visit they will need to use either of the following Internet Browsers: [employer] or Google Chrome**  IF USING DOXIMITY or DOXY.ME - The patient will receive a link just prior to their visit by text.     FULL LENGTH CONSENT FOR TELE-HEALTH VISIT   I hereby voluntarily request, consent and authorize [facility] and its employed or contracted physicians, physician assistants, nurse practitioners or other licensed health care professionals (the Practitioner), to provide me with telemedicine health care services (the "Services") as deemed necessary by the treating Practitioner. I acknowledge and consent to receive the Services by the Practitioner via telemedicine. I understand that the telemedicine visit will involve communicating with the Practitioner through live audiovisual communication technology and the disclosure of certain medical information by electronic transmission. I acknowledge that I have been given the opportunity to request an in-person assessment or other available alternative prior to the telemedicine visit and am voluntarily participating in the telemedicine visit.  I understand that I have the right to withhold or withdraw my consent to the use of telemedicine in the course of my care at any time, without affecting my right to future care or treatment,  and that the Practitioner or I may terminate the telemedicine visit at any time. I understand that I have the right to inspect all information obtained and/or recorded in the course of the telemedicine visit and may receive copies of available information for a reasonable fee.  I understand that some of the potential risks of receiving the Services via telemedicine include:  Marland Kitchen Delay or interruption in medical evaluation due to technological equipment failure or disruption; . Information transmitted may not be sufficient (e.g. poor resolution of images) to allow for appropriate medical decision making by the Practitioner; and/or  . In rare instances, security protocols could fail, causing a breach of personal health information.  Furthermore, I acknowledge that it is my responsibility to provide information about my medical history, conditions and care that is complete and accurate to the best of my ability. I acknowledge that Practitioner's advice, recommendations, and/or decision may be based on factors not within their control, such as incomplete or inaccurate data provided by me or distortions of diagnostic images or specimens that may result from electronic transmissions. I understand  that the practice of medicine is not an exact science and that Practitioner makes no warranties or guarantees regarding treatment outcomes. I acknowledge that I will receive a copy of this consent concurrently upon execution via email to the email address I last provided but may also request a printed copy by calling the office of [facility].    I understand that my insurance will be billed for this visit.   I have read or had this consent read to me. . I understand the contents of this consent, which adequately explains the benefits and risks of the Services being provided via telemedicine.  . I have been provided ample opportunity to ask questions regarding this consent and the Services and have had my questions  answered to my satisfaction. . I give my informed consent for the services to be provided through the use of telemedicine in my medical care  By participating in this telemedicine visit I agree to the above.

## 2018-06-17 ENCOUNTER — Other Ambulatory Visit: Payer: Self-pay

## 2018-06-17 ENCOUNTER — Ambulatory Visit
Admission: RE | Admit: 2018-06-17 | Discharge: 2018-06-17 | Disposition: A | Discharge status: Home / Self Care | Payer: Medicare HMO | Source: Ambulatory Visit | Attending: Student | Admitting: Student

## 2018-06-17 DIAGNOSIS — R1013 Epigastric pain: Secondary | ICD-10-CM

## 2018-06-17 DIAGNOSIS — R1310 Dysphagia, unspecified: Secondary | ICD-10-CM

## 2018-06-17 DIAGNOSIS — K219 Gastro-esophageal reflux disease without esophagitis: Secondary | ICD-10-CM | POA: Diagnosis not present

## 2018-06-20 ENCOUNTER — Other Ambulatory Visit: Payer: Self-pay

## 2018-06-20 ENCOUNTER — Telehealth (INDEPENDENT_AMBULATORY_CARE_PROVIDER_SITE_OTHER): Payer: Medicare HMO | Admitting: Cardiovascular Disease

## 2018-06-20 DIAGNOSIS — K219 Gastro-esophageal reflux disease without esophagitis: Secondary | ICD-10-CM | POA: Diagnosis not present

## 2018-06-20 DIAGNOSIS — R0789 Other chest pain: Secondary | ICD-10-CM | POA: Diagnosis not present

## 2018-06-20 DIAGNOSIS — R9439 Abnormal result of other cardiovascular function study: Secondary | ICD-10-CM

## 2018-06-20 DIAGNOSIS — R7301 Impaired fasting glucose: Secondary | ICD-10-CM | POA: Diagnosis not present

## 2018-06-20 DIAGNOSIS — E7800 Pure hypercholesterolemia, unspecified: Secondary | ICD-10-CM

## 2018-06-20 DIAGNOSIS — I10 Essential (primary) hypertension: Secondary | ICD-10-CM

## 2018-06-20 NOTE — Patient Instructions (Signed)

## 2018-06-20 NOTE — Progress Notes (Signed)
Virtual Visit via Video Note   This visit type was conducted due to national recommendations for restrictions regarding the COVID-19 Pandemic (e.g. social distancing) in an effort to limit this patient's exposure and mitigate transmission in our community.  Due to her co-morbid illnesses, this patient is at least at moderate risk for complications without adequate follow up.  This format is felt to be most appropriate for this patient at this time.  All issues noted in this document were discussed and addressed.  A limited physical exam was performed with this format.  Please refer to the patient's chart for her consent to telehealth for [facility].   I connected with  Alyssa Mcclure on 06/20/18 by a video enabled telemedicine application and verified that I am speaking with the correct person using two identifiers. I discussed the limitations of evaluation and management by telemedicine. The patient expressed understanding and agreed to proceed.   Evaluation Performed:  Follow-up visit  Date:  06/20/2018   ID:  Alyssa Mcclure, DOB 28-Mar-1950, MRN [id number]  Patient Location:  [address] [place] [address]   Provider location:   [facility], [place] office  PCP:  [name], MD  Cardiologist:  [facility]   Chief Complaint: Abnormal stress test    History of Present Illness:    Alyssa Mcclure is a 68 y.o. female who presents via audio/video conferencing for a telehealth visit today.   The patient does not symptoms concerning for COVID-19 infection (fever, chills, cough, or new SHORTNESS OF BREATH).   Patient has a past medical history of Hyperlipidemia Hypertension, Chronic cough GERD Referred by Dr. Dellie Burns for abnormal stress test   Has a heavy chest, at rest in bed especially in the morning "all the time" Had a barium upper GI, "looked good' Denies having any worsening of her mild low-grade chronic chest  discomfort with exertion Stays active most of the time, no regular exercise program Doing her errands no escalation of discomfort Does not seem to be positional  Cough "all the time" Sometimes coughs up white phlegm into the back of her throat  "winded " all the time, sedentary lifestyle, sits more If she walks too far or too fast will slow down to recover her breathing then continue on  Excited to have knee replacement surgery in several weeks time with Dr. Ernest Pine  Stress echocardiogram ordered for chronic low-grade chest heaviness  performed at [facility] June 09, 2018 Read by Bethann Punches Exercise for 3 minutes 50 seconds Report indicating mild hypokinesis of the basal inferior wall Ejection fraction greater than 55% Achieved 6.3 METS Target heart rate achieved 160 bpm  Prior CV studies:   The following studies were reviewed today:    Past Medical History:  Diagnosis Date  . Arthritis   . Depression   . GERD (gastroesophageal reflux disease)   . H/O Bell's palsy   . Heart murmur   . Hepatitis   . HLD (hyperlipidemia)   . HOH (hard of hearing)   . Hypertension    Past Surgical History:  Procedure Laterality Date  . ABDOMINAL HYSTERECTOMY    . BILATERAL CARPAL TUNNEL RELEASE Bilateral   . bladder lift  2011  . BROW LIFT Bilateral 06/12/2016   Procedure: BLEPHAROPLASTY UPPER EYELID WITH EXCESS SKIN;  Surgeon: Imagene Riches, MD;  Location: [facility];  Service: Ophthalmology;  Laterality: Bilateral;  . CATARACT EXTRACTION W/PHACO Right 05/09/2015  Procedure: CATARACT EXTRACTION PHACO AND INTRAOCULAR LENS PLACEMENT (IOC);  Surgeon: Sallee Lange, MD;  Location: [facility];  Service: Ophthalmology;  Laterality: Right;  [place] 45.1 AP% 21.0 CDE 19.36 Fluid Pack Lot # [id number]  . CATARACT EXTRACTION W/PHACO Left 03/28/2016   Procedure: CATARACT EXTRACTION PHACO AND INTRAOCULAR LENS PLACEMENT (IOC);  Surgeon: Sallee Lange, MD;  Location: [facility];  Service:  Ophthalmology;  Laterality: Left;  [place] 01:05 AP% 21.3 CDE 24.21 fluid pack lot # [id number]  . CESAREAN SECTION    . HERNIA REPAIR    . KNEE ARTHROSCOPY Bilateral   . ROTATOR CUFF REPAIR Bilateral      Current Meds  Medication Sig  . aspirin EC 81 MG tablet Take 81 mg by mouth daily.  . DULoxetine (CYMBALTA) 60 MG capsule Take 60 mg by mouth daily.  . hydrochlorothiazide (HYDRODIURIL) 25 MG tablet Take 25 mg by mouth daily.  Marland Kitchen losartan (COZAAR) 100 MG tablet Take 100 mg by mouth daily.  . pantoprazole (PROTONIX) 40 MG tablet Take 40 mg by mouth 2 (two) times daily.   . traZODone (DESYREL) 50 MG tablet Take 50 mg by mouth at bedtime as needed for sleep.      Allergies:   Penicillins, Adhesive [tape], Losartan, and Statins   Social History   Tobacco Use  . Smoking status: Former Smoker    Types: Cigarettes    Quit date: 2005    Years since quitting: 15.4  . Smokeless tobacco: Never Used  . Tobacco comment: quit 10 + years  Substance Use Topics  . Alcohol use: Yes    Alcohol/week: 1.0 standard drinks    Types: 1 Glasses of wine per week    Comment: occ  . Drug use: No     Current Outpatient Medications on File Prior to Visit  Medication Sig Dispense Refill  . aspirin EC 81 MG tablet Take 81 mg by mouth daily.    . DULoxetine (CYMBALTA) 60 MG capsule Take 60 mg by mouth daily.    . hydrochlorothiazide (HYDRODIURIL) 25 MG tablet Take 25 mg by mouth daily.    Marland Kitchen losartan (COZAAR) 100 MG tablet Take 100 mg by mouth daily.    . pantoprazole (PROTONIX) 40 MG tablet Take 40 mg by mouth 2 (two) times daily.     . traZODone (DESYREL) 50 MG tablet Take 50 mg by mouth at bedtime as needed for sleep.     Marland Kitchen oxyCODONE-acetaminophen (PERCOCET) 5-325 MG tablet Take 1 tablet by mouth every 4 (four) hours as needed for severe pain. (Patient not taking: Reported on 06/20/2018) 6 tablet 0   No current facility-administered medications on file prior to visit.      Family Hx: The patient's  family history includes Breast cancer in her cousin and paternal aunt; Breast cancer (age of onset: 21) in her daughter. There is no history of Prostate cancer, Kidney cancer, or Bladder Cancer.  ROS:   Please see the history of present illness.    Review of Systems  Constitutional: Negative.   HENT: Negative.   Respiratory: Negative.   Cardiovascular: Negative.   Gastrointestinal: Negative.   Musculoskeletal: Negative.   Neurological: Negative.   Psychiatric/Behavioral: Negative.   All other systems reviewed and are negative.    Labs/Other Tests and Data Reviewed:    Recent Labs: No results found for requested labs within last 8760 hours.   Recent Lipid Panel No results found for: CHOL, TRIG, HDL, CHOLHDL, LDLCALC, LDLDIRECT  Wt Readings from Last 3  Encounters:  06/20/18 179 lb (81.2 kg)  06/12/16 173 lb (78.5 kg)  04/16/16 178 lb 9.6 oz (81 kg)     Exam:    Vital Signs: Vital signs may also be detailed in the HPI BP 128/77 (BP Location: Left Arm, Patient Position: Sitting, Cuff Size: Normal)   Pulse 96   Ht 5\' 5"  (1.651 m)   Wt 179 lb (81.2 kg)   BMI 29.79 kg/m   Wt Readings from Last 3 Encounters:  06/20/18 179 lb (81.2 kg)  06/12/16 173 lb (78.5 kg)  04/16/16 178 lb 9.6 oz (81 kg)   Temp Readings from Last 3 Encounters:  06/12/16 97.4 F (36.3 C)  03/28/16 97.5 F (36.4 C)  05/09/15 98.5 F (36.9 C)   BP Readings from Last 3 Encounters:  06/20/18 128/77  06/12/16 119/63  04/16/16 125/83   Pulse Readings from Last 3 Encounters:  06/20/18 96  06/12/16 92  04/16/16 90    120 over 70s, pulse 90, respiration 16  Well nourished, well developed female in no acute distress. Constitutional:  oriented to person, place, and time. No distress.  Head: Normocephalic and atraumatic.  Eyes:  no discharge. No scleral icterus.  Neck: Normal range of motion. Neck supple.  Pulmonary/Chest: No audible wheezing, no distress, appears comfortable Musculoskeletal:  Normal range of motion.  no  tenderness or deformity.  Neurological:   Coordination normal. Full exam not performed Skin:  No rash Psychiatric:  normal mood and affect. behavior is normal. Thought content normal.    ASSESSMENT & PLAN:    Preop cardiovascular evaluation Details below, no further cardiac testing needed at this time She does not have anginal symptoms, her subtle chest discomfort at rest is atypical.  Stress test EKG read as normal.  . Recommend she proceed with her total knee replacement surgery with Dr. Ernest Pine  Chest heaviness Atypical in nature, typically at rest when in bed sitting up in the morning not with exertion Reports it is chronic all the time not associated with activity Stress test result reviewed, heart rate up to 160 bpm no significant EKG changes Very small region wall motion abnormality documented basal inferior wall This would place her at low risk for upcoming surgery Given no anginal symptoms, subtle findings on stress echocardiogram, no further testing at this time prior to total knee replacement surgery in several weeks time with Dr. Ernest Pine -If she were to develop more anginal symptoms in the future we recommended calcium scoring in [facility] , cardiac CTA could also be performed   Benign essential hypertension Blood pressure is well controlled on today's visit. No changes made to the medications.  Elevated fasting blood sugar We have encouraged continued exercise, careful diet management in an effort to lose weight.   Gastroesophageal reflux disease, esophagitis presence not specified Managed by Dr. Mechele Collin She does have chronic cough, unclear if this is exacerbated by GERD Suggested she talk with Dr. Mechele Collin, could consider trying alternate PPI  Pure hypercholesterolemia Cholesterol poorly controlled, Discussed this with her in detail Additional work-up for management of her hyperlipidemia includes a CT coronary calcium score   COVID-19  Education: The signs and symptoms of COVID-19 were discussed with the patient and how to seek care for testing (follow up with PCP or arrange E-visit).  The importance of social distancing was discussed today.  Patient Risk:   After full review of this patients clinical status, I feel that they are at least moderate risk at this time.  Time:  Today, I have spent 45 minutes with the patient with telehealth technology discussing the cardiac and medical problems/diagnoses detailed above   10 min spent reviewing the chart prior to patient visit today   Medication Adjustments/Labs and Tests Ordered: Current medicines are reviewed at length with the patient today.  Concerns regarding medicines are outlined above.   Tests Ordered: No tests ordered   Medication Changes: No changes made   Disposition: Follow-up as needed   Signed, Julien Nordmann, MD  06/20/2018 12:05 PM    [facility] [address], [place], [place] [address]

## 2018-06-24 ENCOUNTER — Other Ambulatory Visit: Payer: Self-pay

## 2018-06-24 ENCOUNTER — Encounter
Admission: RE | Admit: 2018-06-24 | Discharge: 2018-06-24 | Disposition: A | Discharge status: Home / Self Care | Payer: Medicare HMO | Source: Ambulatory Visit | Attending: Orthopedic Surgery | Admitting: Orthopedic Surgery

## 2018-06-24 ENCOUNTER — Other Ambulatory Visit: Payer: Medicare HMO

## 2018-06-24 DIAGNOSIS — Z01812 Encounter for preprocedural laboratory examination: Secondary | ICD-10-CM | POA: Insufficient documentation

## 2018-06-24 HISTORY — DX: Prediabetes: R73.03

## 2018-06-24 LAB — TYPE AND SCREEN
ABO/RH(D): O POS
Antibody Screen: NEGATIVE

## 2018-06-24 LAB — CBC
HCT: 38.8 % (ref 36.0–46.0)
Hemoglobin: 12.7 g/dL (ref 12.0–15.0)
MCH: 30.1 pg (ref 26.0–34.0)
MCHC: 32.7 g/dL (ref 30.0–36.0)
MCV: 91.9 fL (ref 80.0–100.0)
Platelets: 216 10*3/uL (ref 150–400)
RBC: 4.22 MIL/uL (ref 3.87–5.11)
RDW: 12.7 % (ref 11.5–15.5)
WBC: 6.7 10*3/uL (ref 4.0–10.5)
nRBC: 0 % (ref 0.0–0.2)

## 2018-06-24 LAB — C-REACTIVE PROTEIN: CRP: <0.8 mg/dL (ref <1.0)

## 2018-06-24 LAB — URINALYSIS, ROUTINE W REFLEX MICROSCOPIC
Bilirubin Urine: NEGATIVE
Glucose, UA: NEGATIVE mg/dL
Hgb urine dipstick: NEGATIVE
Ketones, ur: NEGATIVE mg/dL
Leukocytes,Ua: NEGATIVE
Nitrite: NEGATIVE
Protein, ur: NEGATIVE mg/dL
Specific Gravity, Urine: 1.006 (ref 1.005–1.030)
pH: 7 (ref 5.0–8.0)

## 2018-06-24 LAB — PROTIME-INR
INR: 0.9 (ref 0.8–1.2)
Prothrombin Time: 12.2 seconds (ref 11.4–15.2)

## 2018-06-24 LAB — COMPREHENSIVE METABOLIC PANEL
ALT: 19 U/L (ref 0–44)
AST: 21 U/L (ref 15–41)
Albumin: 4.1 g/dL (ref 3.5–5.0)
Alkaline Phosphatase: 102 U/L (ref 38–126)
Anion gap: 12 (ref 5–15)
BUN: 15 mg/dL (ref 8–23)
CO2: 25 mmol/L (ref 22–32)
Calcium: 9.4 mg/dL (ref 8.9–10.3)
Chloride: 97 mmol/L — ABNORMAL LOW (ref 98–111)
Creatinine, Ser: 0.76 mg/dL (ref 0.44–1.00)
GFR calc Af Amer: >60 mL/min (ref >60)
GFR calc non Af Amer: >60 mL/min (ref >60)
Glucose, Bld: 101 mg/dL — ABNORMAL HIGH (ref 70–99)
Potassium: 3.8 mmol/L (ref 3.5–5.1)
Sodium: 134 mmol/L — ABNORMAL LOW (ref 135–145)
Total Bilirubin: 0.7 mg/dL (ref 0.3–1.2)
Total Protein: 7 g/dL (ref 6.5–8.1)

## 2018-06-24 LAB — SURGICAL PCR SCREEN
MRSA, PCR: NEGATIVE
Staphylococcus aureus: NEGATIVE

## 2018-06-24 LAB — SEDIMENTATION RATE: Sed Rate: 18 mm/hr (ref 0–30)

## 2018-06-24 LAB — APTT: aPTT: 29 seconds (ref 24–36)

## 2018-06-24 NOTE — Patient Instructions (Signed)
Your procedure is scheduled on: Wednesday 07/02/18 Report to [place]. To find out your arrival time please call [phone] between 1PM - 3PM on Tuesday 07/01/18.  Remember: Instructions that are not followed completely may result in serious medical risk, up to and including death, or upon the discretion of your surgeon and anesthesiologist your surgery may need to be rescheduled.     _X__ 1. Do not eat food after midnight the night before your procedure.                 No gum chewing or hard candies. You may drink clear liquids up to 2 hours                 before you are scheduled to arrive for your surgery- DO not drink clear                 liquids within 2 hours of the start of your surgery.                 Clear Liquids include:  water, apple juice without pulp, clear carbohydrate                 drink such as Clearfast or Gatorade, Black Coffee or Tea (Do not add                 anything to coffee or tea).  __X__2.  On the morning of surgery brush your teeth with toothpaste and water, you                 may rinse your mouth with mouthwash if you wish.  Do not swallow any              toothpaste of mouthwash.     _X__ 3.  No Alcohol for 24 hours before or after surgery.   _X__ 4.  Do Not Smoke or use e-cigarettes For 24 Hours Prior to Your Surgery.                 Do not use any chewable tobacco products for at least 6 hours prior to                 surgery.  ____  5.  Bring all medications with you on the day of surgery if instructed.   __X__  6.  Notify your doctor if there is any change in your medical condition      (cold, fever, infections).     Do not wear jewelry, make-up, hairpins, clips or nail polish. Do not wear lotions, powders, or perfumes.  Do not shave 48 hours prior to surgery. Men may shave face and neck. Do not bring valuables to the hospital.    [facility] is not responsible for any belongings or  valuables.  Contacts, dentures/partials or body piercings may not be worn into surgery. Bring a case for your contacts, glasses or hearing aids, a denture cup will be supplied. Leave your suitcase in the car. After surgery it may be brought to your room. For patients admitted to the hospital, discharge time is determined by your treatment team.   Patients discharged the day of surgery will not be allowed to drive home.   Please read over the following fact sheets that you were given:   MRSA Information  __X__ Take these medicines the morning of surgery with A SIP OF WATER:  1. DULoxetine (CYMBALTA)  2. pantoprazole (PROTONIX  3.   4.  5.  6.  ____ Fleet Enema (as directed)   __X__ Use CHG Soap/SAGE wipes as directed  ____ Use inhalers on the day of surgery  ____ Stop metformin/Janumet/Farxiga 2 days prior to surgery    ____ Take 1/2 of usual insulin dose the night before surgery. No insulin the morning          of surgery.   ____ Stop Blood Thinners Coumadin/Plavix/Xarelto/Pleta/Pradaxa/Eliquis/Effient/Aspirin  on   Or contact your Surgeon, Cardiologist or Medical Doctor regarding  ability to stop your blood thinners  __X__ Stop Anti-inflammatories 7 days before surgery such as Advil, Ibuprofen, Motrin,  BC or Goodies Powder, Naprosyn, Naproxen, Aleve, Aspirin    __X__ Stop all herbal supplements, fish oil or vitamin E until after surgery.    ____ Bring C-Pap to the hospital.

## 2018-06-24 NOTE — Pre-Procedure Instructions (Signed)
Echo stress with physician supervision6/01/2018 [facility] Component Name Value Ref Range  LV Ejection Fraction (%) 55   Aortic Valve Regurgitation Grade mild   Aortic Valve Stenosis Grade none   Mitral Valve Regurgitation Grade mild   Mitral Valve Stenosis Grade none   Tricuspid Valve Regurgitation Grade trivial   Tricuspid Valve Regurgitation Max Velocity (m/s) 3.3 m/sec  Result Narrative                                 Alyssa Mcclure, Alyssa Mcclure      [facility]                  [id number]      [facility]              Acct #: [id number]      [address] Alyssa Mcclure, [name], [place] [id number]   Date: 06/09/2018 10: 25 AM                                Adult  Female  Age: 68 yrs      ECHOCARDIOGRAM REPORT               Outpatient                                KC^^KCWI    STUDY:Stress Echo       TAPE:          MD1: Alyssa Mcclure, Alyssa Mcclure    ECHO:Yes  DOPPLER:Yes    FILE:0000-000-000    BP: 123/77 mmHg    COLOR:Yes  CONTRAST:No   MACHINE:Philips  RV BIOPSY:No     3D:No SOUND QLTY:Moderate      Height: 65 in   MEDIUM:None                       Weight: 180 lb                                BSA: 1.9 m2 _________________________________________________________________________________________        HISTORY: Chest pain         REASON: Assess, LV function       Indication: Chest pressure [R07.89 (ICD-10-CM)] _________________________________________________________________________________________ STRESS ECHOCARDIOGRAPHY         Drugs: None       Target HR: 128 bpm      Maximum Predicted HR: 151  bpm +----------------------------------------------------------------------------+ :Stage      Duration           HR     BP         : :  RESTING   :              :76    :123/77       : :---------------+----------------------------+----------+---------+     : : EXERCISE   :3:50            :160    :/          : :---------------:----------------------------:----------:---------+     : : RECOVERY   :5:09            :73    :152/91       : :---------------+----------------------------+----------+---------+     : +----------------------------------------------------------------------------+  Stress Duration: 3: 50 mm: ss    Max Stress H.R.: 160 bpm       Target HR Achieved: Yes Maximum workload of 6.30 METS was achieved during exercise _________________________________________________________________________________________ WALL SEGMENT CHANGES             Rest        Stress Anterior Septum Basal: Normal       Hyperkinetic          Mid: Normal       Hyperkinetic         Apical: Normal       Hyperkinetic  Anterior Wall Basal: Normal       Hyperkinetic          Mid: Normal       Hyperkinetic         Apical: Normal       Hyperkinetic   Lateral Wall Basal: Normal       Hyperkinetic          Mid: Normal       Hyperkinetic         Apical: Normal       Hyperkinetic  Posterior Wall Basal: Normal       Hyperkinetic          Mid: Normal       Hyperkinetic  Inferior Wall Basal: Normal       HYPOKINETIC          Mid: Normal       Hyperkinetic         Apical: Normal       Hyperkinetic Inferior Septum Basal: Normal       Hyperkinetic          Mid: Normal        Hyperkinetic       Resting EF: >55% (Est.)         Stress EF: >55% (Est.)  _________________________________________________________________________________________ ADDITIONAL FINDINGS _________________________________________________________________________________________ STRESS ECG RESULTS      ECG Results: Normal _________________________________________________________________________________________ ECHOCARDIOGRAPHIC DESCRIPTIONS LEFT VENTRICLE          Size: Normal      Contraction: Normal       LV Masses: No Masses          LVH: None      Dias.FxClass: Normal RIGHT VENTRICLE          Size: Normal            Free Wall: Normal      Contraction: Normal            RV Masses: No mass PERICARDIUM         Fluid: No effusion _________________________________________________________________________________________  DOPPLER ECHO and OTHER SPECIAL PROCEDURES         Aortic: MILD AR          No AS         Mitral: MILD MR          No MS             MV Inflow E Vel = nm*   MV Annulus E'Vel = nm*             E/E'Ratio = nm*       Tricuspid: TRIVIAL TR         No TS             327.3 cm/sec peak TR vel       Pulmonary: TRIVIAL PR  No PS _________________________________________________________________________________________ ECHOCARDIOGRAPHIC MEASUREMENTS 2D DIMENSIONS AORTA         Values  Normal Range  MAIN PA     Values  Normal Range        Annulus: nm*   [2.1-2.5]       PA Main: nm*    [1.5-2.1]       Aorta Sin: nm*   [2.7-3.3]  RIGHT VENTRICLE      ST Junction: nm*   [2.3-2.9]       RV Base: nm*    [<4.2]       Asc.Aorta: nm*   [2.3-3.1]        RV Mid: nm*    [<3.5] LEFT VENTRICLE                    RV Length: nm*    [<8.6]         LVIDd: nm*   [3.9-5.3]  INFERIOR VENA CAVA         LVIDs: nm*              Max. IVC: nm*    [<=2.1]           FS: nm*   [>25]         Min. IVC: nm*          SWT: nm*   [0.5-0.9]  ------------------          PWT: nm*   [0.5-0.9]  nm* - not measured LEFT ATRIUM        LA Diam: nm*   [2.7-3.8]      LA A4C Area: nm*   [<20]       LA Volume: nm*   [22-52] _________________________________________________________________________________________ INTERPRETATION ABNORMAL STRESS ECHOCARDIOGRAM NORMAL RIGHT VENTRICULAR SYSTOLIC FUNCTION MILD VALVULAR REGURGITATION (See above) NO VALVULAR STENOSIS NOTED Mild stress induced changes likely indicating ischemia _________________________________________________________________________________________ Electronically signed by   Danella Penton, MD on 06/09/2018 06: 25 PM      Performed By: Rayetta Humphrey, RCS   Ordering Physician: Maurine Minister _________________________________________________________________________________________  Other Result Information  Interface, Text Results In - 06/09/2018  6:25 PM EDT                                                              [facility]                                    [phone]           [facility]                           Acct #: [id number]           [address], [place],  [place] [address]      Date: 06/09/2018 10: 25 AM  Adult   Female   Age: 27 yrs           ECHOCARDIOGRAM REPORT                              Outpatient                                                              KC^^KCWI      STUDY:Stress Echo              TAPE:                    MD1: Alyssa Mcclure, Alyssa Mcclure       ECHO:Yes    DOPPLER:Yes       FILE:0000-000-000         BP: 123/77 mmHg      COLOR:Yes   CONTRAST:No     MACHINE:Philips  RV BIOPSY:No          3D:No  SOUND QLTY:Moderate            Height: 65 in     MEDIUM:None                                              Weight: 180 lb                                                              BSA: 1.9 m2 _________________________________________________________________________________________               HISTORY: Chest pain                REASON: Assess, LV function            Indication: Chest pressure [R07.89 (ICD-10-CM)] _________________________________________________________________________________________ STRESS ECHOCARDIOGRAPHY                 Drugs: None             Target HR: 128 bpm           Maximum Predicted HR: 151 bpm +----------------------------------------------------------------------------+ :Stage           Duration                     HR         BP                  : :   RESTING     :                            :76        :123/77              : :---------------+----------------------------+----------+---------+          : :  EXERCISE     :3:50                        :  160       :/                   : :---------------:----------------------------:----------:---------+          : :  RECOVERY     :5:09                        :73        :152/91              : :---------------+----------------------------+----------+---------+          : +----------------------------------------------------------------------------+       Stress Duration: 3: 50 mm: ss       Max Stress H.R.: 160 bpm             Target HR Achieved: Yes Maximum workload of 6.30 METS was achieved during exercise _________________________________________________________________________________________ WALL SEGMENT CHANGES                        Rest                Stress Anterior Septum Basal: Normal              Hyperkinetic                   Mid: Normal              Hyperkinetic                Apical: Normal               Hyperkinetic   Anterior Wall Basal: Normal              Hyperkinetic                   Mid: Normal              Hyperkinetic                Apical: Normal              Hyperkinetic    Lateral Wall Basal: Normal              Hyperkinetic                   Mid: Normal              Hyperkinetic                Apical: Normal              Hyperkinetic  Posterior Wall Basal: Normal              Hyperkinetic                   Mid: Normal              Hyperkinetic   Inferior Wall Basal: Normal              HYPOKINETIC                   Mid: Normal              Hyperkinetic                Apical: Normal              Hyperkinetic Inferior Septum Basal: Normal  Hyperkinetic                   Mid: Normal              Hyperkinetic            Resting EF: >55% (Est.)                  Stress EF: >55% (Est.)  _________________________________________________________________________________________ ADDITIONAL FINDINGS _________________________________________________________________________________________ STRESS ECG RESULTS           ECG Results: Normal _________________________________________________________________________________________ ECHOCARDIOGRAPHIC DESCRIPTIONS LEFT VENTRICLE                  Size: Normal           Contraction: Normal             LV Masses: No Masses                   LVH: None          Dias.FxClass: Normal RIGHT VENTRICLE                  Size: Normal                       Free Wall: Normal           Contraction: Normal                       RV Masses: No mass PERICARDIUM                 Fluid: No effusion _________________________________________________________________________________________  DOPPLER ECHO and OTHER SPECIAL PROCEDURES                Aortic: MILD AR                    No AS                Mitral: MILD MR                    No MS                        MV Inflow E Vel = nm*      MV Annulus E'Vel = nm*                        E/E'Ratio =  nm*             Tricuspid: TRIVIAL TR                 No TS                        327.3 cm/sec peak TR vel             Pulmonary: TRIVIAL PR                 No PS _________________________________________________________________________________________ ECHOCARDIOGRAPHIC MEASUREMENTS 2D DIMENSIONS AORTA                  Values   Normal Range   MAIN PA         Values    Normal Range               Annulus: nm*     [2.1-2.5]  PA Main: nm*       [1.5-2.1]             Aorta Sin: nm*     [2.7-3.3]    RIGHT VENTRICLE           ST Junction: nm*     [2.3-2.9]              RV Base: nm*       [<4.2]             Asc.Aorta: nm*     [2.3-3.1]               RV Mid: nm*       [<3.5] LEFT VENTRICLE                                      RV Length: nm*       [<8.6]                 LVIDd: nm*     [3.9-5.3]    INFERIOR VENA CAVA                 LVIDs: nm*                           Max. IVC: nm*       [<=2.1]                    FS: nm*     [>25]                 Min. IVC: nm*                   SWT: nm*     [0.5-0.9]    ------------------                   PWT: nm*     [0.5-0.9]    nm* - not measured LEFT ATRIUM               LA Diam: nm*     [2.7-3.8]           LA A4C Area: nm*     [<20]             LA Volume: nm*     [22-52] _________________________________________________________________________________________ INTERPRETATION ABNORMAL STRESS ECHOCARDIOGRAM NORMAL RIGHT VENTRICULAR SYSTOLIC FUNCTION MILD VALVULAR REGURGITATION (See above) NO VALVULAR STENOSIS NOTED Mild stress induced changes likely indicating ischemia _________________________________________________________________________________________ Electronically signed by      Rusty Aus, MD on 06/09/2018 06: 25 PM          Performed By: Scherrie November, RCS    Ordering Physician: Paulita Cradle _________________________________________________________________________________________  Status Results Details

## 2018-06-25 LAB — URINE CULTURE
Culture: NO GROWTH
Special Requests: NORMAL

## 2018-06-26 LAB — IGE: IgE (Immunoglobulin E), Serum: 4 IU/mL — ABNORMAL LOW (ref 6–495)

## 2018-06-27 ENCOUNTER — Other Ambulatory Visit: Payer: Self-pay

## 2018-06-27 ENCOUNTER — Encounter
Admission: RE | Admit: 2018-06-27 | Discharge: 2018-06-27 | Disposition: A | Discharge status: Home / Self Care | Payer: Medicare HMO | Source: Ambulatory Visit | Attending: Orthopedic Surgery | Admitting: Orthopedic Surgery

## 2018-06-27 DIAGNOSIS — Z01812 Encounter for preprocedural laboratory examination: Secondary | ICD-10-CM | POA: Diagnosis not present

## 2018-06-28 LAB — NOVEL CORONAVIRUS, NAA (HOSP ORDER, SEND-OUT TO REF LAB; TAT 18-24 HRS): SARS-CoV-2, NAA: NOT DETECTED

## 2018-07-01 MED ORDER — CLINDAMYCIN PHOSPHATE 900 MG/50ML IV SOLN: 900.0000 mg | INTRAVENOUS | Status: DC

## 2018-07-01 MED ORDER — TRANEXAMIC ACID-NACL 1000-0.7 MG/100ML-% IV SOLN
1000.0000 mg | INTRAVENOUS | Status: DC
  Filled 2018-07-01: qty 100

## 2018-07-02 ENCOUNTER — Encounter: Payer: Self-pay | Admitting: Orthopedic Surgery

## 2018-07-02 ENCOUNTER — Inpatient Hospital Stay: Payer: Medicare HMO | Admitting: Anesthesiology

## 2018-07-02 ENCOUNTER — Inpatient Hospital Stay
Admission: RE | Admit: 2018-07-02 | Discharge: 2018-07-04 | DRG: 470 | Disposition: A | Discharge status: Home Health | Payer: Medicare HMO | Attending: Orthopedic Surgery | Admitting: Orthopedic Surgery

## 2018-07-02 ENCOUNTER — Encounter
Admission: RE | Disposition: A | Discharge status: Home Health | Payer: Self-pay | Source: Home / Self Care | Attending: Orthopedic Surgery

## 2018-07-02 ENCOUNTER — Other Ambulatory Visit: Payer: Self-pay

## 2018-07-02 ENCOUNTER — Inpatient Hospital Stay: Payer: Medicare HMO

## 2018-07-02 DIAGNOSIS — E785 Hyperlipidemia, unspecified: Secondary | ICD-10-CM | POA: Diagnosis not present

## 2018-07-02 DIAGNOSIS — Z96659 Presence of unspecified artificial knee joint: Secondary | ICD-10-CM

## 2018-07-02 DIAGNOSIS — F329 Major depressive disorder, single episode, unspecified: Secondary | ICD-10-CM | POA: Diagnosis present

## 2018-07-02 DIAGNOSIS — M1711 Unilateral primary osteoarthritis, right knee: Principal | ICD-10-CM | POA: Diagnosis present

## 2018-07-02 DIAGNOSIS — H9190 Unspecified hearing loss, unspecified ear: Secondary | ICD-10-CM | POA: Diagnosis not present

## 2018-07-02 DIAGNOSIS — Z96651 Presence of right artificial knee joint: Secondary | ICD-10-CM | POA: Diagnosis not present

## 2018-07-02 DIAGNOSIS — Z880 Allergy status to penicillin: Secondary | ICD-10-CM

## 2018-07-02 DIAGNOSIS — Z79899 Other long term (current) drug therapy: Secondary | ICD-10-CM | POA: Diagnosis not present

## 2018-07-02 DIAGNOSIS — I10 Essential (primary) hypertension: Secondary | ICD-10-CM | POA: Diagnosis present

## 2018-07-02 DIAGNOSIS — K219 Gastro-esophageal reflux disease without esophagitis: Secondary | ICD-10-CM | POA: Diagnosis present

## 2018-07-02 DIAGNOSIS — G4733 Obstructive sleep apnea (adult) (pediatric): Secondary | ICD-10-CM | POA: Diagnosis not present

## 2018-07-02 DIAGNOSIS — Z4789 Encounter for other orthopedic aftercare: Secondary | ICD-10-CM | POA: Diagnosis not present

## 2018-07-02 HISTORY — PX: KNEE ARTHROPLASTY: SHX992

## 2018-07-02 LAB — ABO/RH: ABO/RH(D): O POS

## 2018-07-02 SURGERY — ARTHROPLASTY, KNEE, TOTAL, USING IMAGELESS COMPUTER-ASSISTED NAVIGATION
Anesthesia: Spinal | Site: Knee | Laterality: Right

## 2018-07-02 MED ORDER — FENTANYL CITRATE (PF) 100 MCG/2ML IJ SOLN
INTRAMUSCULAR | Status: DC | PRN
  Administered 2018-07-02: 50 ug via INTRAVENOUS

## 2018-07-02 MED ORDER — ENOXAPARIN SODIUM 30 MG/0.3ML ~~LOC~~ SOLN
30.0000 mg | Freq: Two times a day (BID) | SUBCUTANEOUS | Status: DC
  Administered 2018-07-03 – 2018-07-04 (×3): 30 mg via SUBCUTANEOUS
  Filled 2018-07-02 (×3): qty 0.3

## 2018-07-02 MED ORDER — PROPOFOL 10 MG/ML IV BOLUS
INTRAVENOUS | Status: AC
  Filled 2018-07-02: qty 20

## 2018-07-02 MED ORDER — ACETAMINOPHEN 10 MG/ML IV SOLN
1000.0000 mg | Freq: Four times a day (QID) | INTRAVENOUS | Status: AC
  Administered 2018-07-02 – 2018-07-03 (×4): 1000 mg via INTRAVENOUS
  Filled 2018-07-02 (×4): qty 100

## 2018-07-02 MED ORDER — HYDROMORPHONE HCL 1 MG/ML IJ SOLN: 0.5000 mg | INTRAMUSCULAR | Status: DC | PRN

## 2018-07-02 MED ORDER — CLINDAMYCIN PHOSPHATE 900 MG/50ML IV SOLN
INTRAVENOUS | Status: DC | PRN
  Administered 2018-07-02: 900 mg via INTRAVENOUS

## 2018-07-02 MED ORDER — SODIUM CHLORIDE 0.9 % IV SOLN
INTRAVENOUS | Status: DC | PRN
  Administered 2018-07-02: 60 mL

## 2018-07-02 MED ORDER — MENTHOL 3 MG MT LOZG
1.0000 | LOZENGE | OROMUCOSAL | Status: DC | PRN
  Filled 2018-07-02: qty 9

## 2018-07-02 MED ORDER — SODIUM CHLORIDE 0.9 % IV SOLN: INTRAVENOUS | Status: DC | PRN

## 2018-07-02 MED ORDER — ONDANSETRON HCL 4 MG PO TABS: 4.0000 mg | ORAL_TABLET | Freq: Four times a day (QID) | ORAL | Status: DC | PRN

## 2018-07-02 MED ORDER — LOSARTAN POTASSIUM 50 MG PO TABS
100.0000 mg | ORAL_TABLET | Freq: Every day | ORAL | Status: DC
  Administered 2018-07-03: 100 mg via ORAL
  Filled 2018-07-02: qty 2

## 2018-07-02 MED ORDER — TRANEXAMIC ACID-NACL 1000-0.7 MG/100ML-% IV SOLN
1000.0000 mg | Freq: Once | INTRAVENOUS | Status: AC
  Administered 2018-07-02: 1000 mg via INTRAVENOUS
  Filled 2018-07-02: qty 100

## 2018-07-02 MED ORDER — PROPOFOL 500 MG/50ML IV EMUL
INTRAVENOUS | Status: AC
  Filled 2018-07-02: qty 100

## 2018-07-02 MED ORDER — CELECOXIB 200 MG PO CAPS
ORAL_CAPSULE | ORAL | Status: AC
  Administered 2018-07-02: 400 mg via ORAL
  Filled 2018-07-02: qty 2

## 2018-07-02 MED ORDER — DEXAMETHASONE SODIUM PHOSPHATE 10 MG/ML IJ SOLN
INTRAMUSCULAR | Status: AC
  Administered 2018-07-02: 8 mg via INTRAVENOUS
  Filled 2018-07-02: qty 1

## 2018-07-02 MED ORDER — DULOXETINE HCL 60 MG PO CPEP
60.0000 mg | ORAL_CAPSULE | Freq: Every day | ORAL | Status: DC
  Administered 2018-07-03 – 2018-07-04 (×2): 60 mg via ORAL
  Filled 2018-07-02 (×5): qty 1

## 2018-07-02 MED ORDER — METOCLOPRAMIDE HCL 10 MG PO TABS: 5.0000 mg | ORAL_TABLET | Freq: Three times a day (TID) | ORAL | Status: DC | PRN

## 2018-07-02 MED ORDER — GLYCOPYRROLATE 0.2 MG/ML IJ SOLN
INTRAMUSCULAR | Status: DC | PRN
  Administered 2018-07-02: 0.2 mg via INTRAVENOUS

## 2018-07-02 MED ORDER — ACETAMINOPHEN 10 MG/ML IV SOLN: 1000.0000 mg | Freq: Four times a day (QID) | INTRAVENOUS | Status: DC

## 2018-07-02 MED ORDER — NEOMYCIN-POLYMYXIN B GU 40-200000 IR SOLN
Status: AC
  Filled 2018-07-02: qty 20

## 2018-07-02 MED ORDER — BUPIVACAINE HCL (PF) 0.25 % IJ SOLN
INTRAMUSCULAR | Status: AC
  Filled 2018-07-02: qty 30

## 2018-07-02 MED ORDER — CELECOXIB 200 MG PO CAPS
200.0000 mg | ORAL_CAPSULE | Freq: Two times a day (BID) | ORAL | Status: DC
  Administered 2018-07-02 – 2018-07-04 (×4): 200 mg via ORAL
  Filled 2018-07-02 (×4): qty 1

## 2018-07-02 MED ORDER — FLEET ENEMA 7-19 GM/118ML RE ENEM: 1.0000 | ENEMA | Freq: Once | RECTAL | Status: DC | PRN

## 2018-07-02 MED ORDER — BUPIVACAINE HCL (PF) 0.25 % IJ SOLN
INTRAMUSCULAR | Status: DC | PRN
  Administered 2018-07-02: 60 mL

## 2018-07-02 MED ORDER — ACETAMINOPHEN 10 MG/ML IV SOLN
INTRAVENOUS | Status: DC | PRN
  Administered 2018-07-02: 1000 mg via INTRAVENOUS

## 2018-07-02 MED ORDER — SODIUM CHLORIDE 0.9 % IV SOLN
INTRAVENOUS | Status: DC
  Administered 2018-07-02 – 2018-07-03 (×2): via INTRAVENOUS

## 2018-07-02 MED ORDER — PROPOFOL 500 MG/50ML IV EMUL
INTRAVENOUS | Status: DC | PRN
  Administered 2018-07-02: 50 ug/kg/min via INTRAVENOUS

## 2018-07-02 MED ORDER — OXYCODONE HCL 5 MG PO TABS
5.0000 mg | ORAL_TABLET | ORAL | Status: DC | PRN
  Administered 2018-07-02 – 2018-07-03 (×3): 5 mg via ORAL
  Filled 2018-07-02 (×3): qty 1

## 2018-07-02 MED ORDER — MAGNESIUM HYDROXIDE 400 MG/5ML PO SUSP
30.0000 mL | Freq: Every day | ORAL | Status: DC
  Administered 2018-07-02 – 2018-07-03 (×2): 30 mL via ORAL
  Filled 2018-07-02 (×2): qty 30

## 2018-07-02 MED ORDER — CLINDAMYCIN PHOSPHATE 900 MG/50ML IV SOLN
INTRAVENOUS | Status: AC
  Filled 2018-07-02: qty 50

## 2018-07-02 MED ORDER — PHENYLEPHRINE HCL (PRESSORS) 10 MG/ML IV SOLN
INTRAVENOUS | Status: DC | PRN
  Administered 2018-07-02: 100 ug via INTRAVENOUS

## 2018-07-02 MED ORDER — EPHEDRINE SULFATE 50 MG/ML IJ SOLN
INTRAMUSCULAR | Status: AC
  Filled 2018-07-02: qty 1

## 2018-07-02 MED ORDER — METOCLOPRAMIDE HCL 10 MG PO TABS
10.0000 mg | ORAL_TABLET | Freq: Three times a day (TID) | ORAL | Status: DC
  Administered 2018-07-02 – 2018-07-04 (×7): 10 mg via ORAL
  Filled 2018-07-02 (×7): qty 1

## 2018-07-02 MED ORDER — METOCLOPRAMIDE HCL 5 MG/ML IJ SOLN: 5.0000 mg | Freq: Three times a day (TID) | INTRAMUSCULAR | Status: DC | PRN

## 2018-07-02 MED ORDER — SODIUM CHLORIDE 0.9 % IV SOLN
INTRAVENOUS | Status: DC | PRN
  Administered 2018-07-02: 25 ug/min via INTRAVENOUS

## 2018-07-02 MED ORDER — DEXAMETHASONE SODIUM PHOSPHATE 10 MG/ML IJ SOLN
INTRAMUSCULAR | Status: DC | PRN
  Administered 2018-07-02: 10 mg via INTRAVENOUS

## 2018-07-02 MED ORDER — SENNOSIDES-DOCUSATE SODIUM 8.6-50 MG PO TABS
1.0000 | ORAL_TABLET | Freq: Two times a day (BID) | ORAL | Status: DC
  Administered 2018-07-02 – 2018-07-04 (×4): 1 via ORAL
  Filled 2018-07-02 (×4): qty 1

## 2018-07-02 MED ORDER — DEXAMETHASONE SODIUM PHOSPHATE 10 MG/ML IJ SOLN
8.0000 mg | Freq: Once | INTRAMUSCULAR | Status: AC
  Administered 2018-07-02: 8 mg via INTRAVENOUS

## 2018-07-02 MED ORDER — CELECOXIB 200 MG PO CAPS
400.0000 mg | ORAL_CAPSULE | Freq: Once | ORAL | Status: AC
  Administered 2018-07-02: 400 mg via ORAL

## 2018-07-02 MED ORDER — OXYCODONE HCL 5 MG PO TABS
10.0000 mg | ORAL_TABLET | ORAL | Status: DC | PRN
  Administered 2018-07-03 – 2018-07-04 (×2): 10 mg via ORAL
  Filled 2018-07-02 (×2): qty 2

## 2018-07-02 MED ORDER — HYDROCHLOROTHIAZIDE 25 MG PO TABS
25.0000 mg | ORAL_TABLET | Freq: Every day | ORAL | Status: DC
  Administered 2018-07-03: 25 mg via ORAL
  Filled 2018-07-02: qty 1

## 2018-07-02 MED ORDER — PHENYLEPHRINE HCL (PRESSORS) 10 MG/ML IV SOLN
INTRAVENOUS | Status: AC
  Filled 2018-07-02: qty 1

## 2018-07-02 MED ORDER — ONDANSETRON HCL 4 MG/2ML IJ SOLN: 4.0000 mg | Freq: Four times a day (QID) | INTRAMUSCULAR | Status: DC | PRN

## 2018-07-02 MED ORDER — TRANEXAMIC ACID-NACL 1000-0.7 MG/100ML-% IV SOLN
INTRAVENOUS | Status: DC | PRN
  Administered 2018-07-02: 1000 mg via INTRAVENOUS

## 2018-07-02 MED ORDER — FERROUS SULFATE 325 (65 FE) MG PO TABS
325.0000 mg | ORAL_TABLET | Freq: Two times a day (BID) | ORAL | Status: DC
  Administered 2018-07-02 – 2018-07-04 (×4): 325 mg via ORAL
  Filled 2018-07-02 (×4): qty 1

## 2018-07-02 MED ORDER — DEXAMETHASONE SODIUM PHOSPHATE 10 MG/ML IJ SOLN
INTRAMUSCULAR | Status: AC
  Filled 2018-07-02: qty 3

## 2018-07-02 MED ORDER — LACTATED RINGERS IV SOLN
INTRAVENOUS | Status: DC
  Administered 2018-07-02 (×2): via INTRAVENOUS

## 2018-07-02 MED ORDER — NEOMYCIN-POLYMYXIN B GU 40-200000 IR SOLN
Status: DC | PRN
  Administered 2018-07-02: 12 mL

## 2018-07-02 MED ORDER — ONDANSETRON HCL 4 MG/2ML IJ SOLN
INTRAMUSCULAR | Status: DC | PRN
  Administered 2018-07-02: 4 mg via INTRAVENOUS

## 2018-07-02 MED ORDER — TRAMADOL HCL 50 MG PO TABS: 50.0000 mg | ORAL_TABLET | ORAL | Status: DC | PRN

## 2018-07-02 MED ORDER — MIDAZOLAM HCL 2 MG/2ML IJ SOLN
INTRAMUSCULAR | Status: AC
  Filled 2018-07-02: qty 2

## 2018-07-02 MED ORDER — PHENOL 1.4 % MT LIQD
1.0000 | OROMUCOSAL | Status: DC | PRN
  Filled 2018-07-02: qty 177

## 2018-07-02 MED ORDER — BUPIVACAINE LIPOSOME 1.3 % IJ SUSP
INTRAMUSCULAR | Status: AC
  Filled 2018-07-02: qty 20

## 2018-07-02 MED ORDER — TRAZODONE HCL 50 MG PO TABS
50.0000 mg | ORAL_TABLET | Freq: Every evening | ORAL | Status: DC | PRN
  Administered 2018-07-02 – 2018-07-03 (×2): 50 mg via ORAL
  Filled 2018-07-02 (×2): qty 1

## 2018-07-02 MED ORDER — ONDANSETRON HCL 4 MG/2ML IJ SOLN
INTRAMUSCULAR | Status: AC
  Filled 2018-07-02: qty 6

## 2018-07-02 MED ORDER — CHLORHEXIDINE GLUCONATE 4 % EX LIQD: 60.0000 mL | Freq: Once | CUTANEOUS | Status: DC

## 2018-07-02 MED ORDER — BISACODYL 10 MG RE SUPP
10.0000 mg | Freq: Every day | RECTAL | Status: DC | PRN
  Administered 2018-07-04: 10 mg via RECTAL
  Filled 2018-07-02: qty 1

## 2018-07-02 MED ORDER — FENTANYL CITRATE (PF) 100 MCG/2ML IJ SOLN: 25.0000 ug | INTRAMUSCULAR | Status: DC | PRN

## 2018-07-02 MED ORDER — MIDAZOLAM HCL 2 MG/2ML IJ SOLN
INTRAMUSCULAR | Status: DC | PRN
  Administered 2018-07-02 (×2): 1 mg via INTRAVENOUS

## 2018-07-02 MED ORDER — ALUM & MAG HYDROXIDE-SIMETH 200-200-20 MG/5ML PO SUSP: 30.0000 mL | ORAL | Status: DC | PRN

## 2018-07-02 MED ORDER — GLYCOPYRROLATE 0.2 MG/ML IJ SOLN
INTRAMUSCULAR | Status: AC
  Filled 2018-07-02: qty 1

## 2018-07-02 MED ORDER — DIPHENHYDRAMINE HCL 12.5 MG/5ML PO ELIX: 12.5000 mg | ORAL_SOLUTION | ORAL | Status: DC | PRN

## 2018-07-02 MED ORDER — PANTOPRAZOLE SODIUM 40 MG PO TBEC
40.0000 mg | DELAYED_RELEASE_TABLET | Freq: Two times a day (BID) | ORAL | Status: DC
  Administered 2018-07-02 – 2018-07-04 (×4): 40 mg via ORAL
  Filled 2018-07-02 (×4): qty 1

## 2018-07-02 MED ORDER — CLINDAMYCIN PHOSPHATE 600 MG/50ML IV SOLN
600.0000 mg | Freq: Four times a day (QID) | INTRAVENOUS | Status: AC
  Administered 2018-07-02 – 2018-07-03 (×4): 600 mg via INTRAVENOUS
  Filled 2018-07-02 (×5): qty 50

## 2018-07-02 MED ORDER — GLYCOPYRROLATE 0.2 MG/ML IJ SOLN
INTRAMUSCULAR | Status: AC
  Filled 2018-07-02: qty 3

## 2018-07-02 MED ORDER — ENSURE ENLIVE PO LIQD: 296.0000 mL | Freq: Once | ORAL | Status: DC

## 2018-07-02 MED ORDER — SODIUM CHLORIDE FLUSH 0.9 % IV SOLN
INTRAVENOUS | Status: AC
  Filled 2018-07-02: qty 50

## 2018-07-02 MED ORDER — GABAPENTIN 300 MG PO CAPS
300.0000 mg | ORAL_CAPSULE | Freq: Once | ORAL | Status: AC
  Administered 2018-07-02: 300 mg via ORAL

## 2018-07-02 MED ORDER — GABAPENTIN 300 MG PO CAPS
ORAL_CAPSULE | ORAL | Status: AC
  Administered 2018-07-02: 300 mg via ORAL
  Filled 2018-07-02: qty 1

## 2018-07-02 MED ORDER — FENTANYL CITRATE (PF) 100 MCG/2ML IJ SOLN
INTRAMUSCULAR | Status: AC
  Filled 2018-07-02: qty 2

## 2018-07-02 MED ORDER — ONDANSETRON HCL 4 MG/2ML IJ SOLN: 4.0000 mg | Freq: Once | INTRAMUSCULAR | Status: DC | PRN

## 2018-07-02 MED ORDER — GABAPENTIN 300 MG PO CAPS
300.0000 mg | ORAL_CAPSULE | Freq: Every day | ORAL | Status: DC
  Administered 2018-07-02 – 2018-07-03 (×2): 300 mg via ORAL
  Filled 2018-07-02 (×2): qty 1

## 2018-07-02 MED ORDER — HYPROMELLOSE (GONIOSCOPIC) 2.5 % OP SOLN
2.0000 [drp] | Freq: Four times a day (QID) | OPHTHALMIC | Status: DC | PRN
  Filled 2018-07-02: qty 15

## 2018-07-02 MED ORDER — POLYVINYL ALCOHOL 1.4 % OP SOLN
2.0000 [drp] | Freq: Four times a day (QID) | OPHTHALMIC | Status: DC | PRN
  Administered 2018-07-02: 2 [drp] via OPHTHALMIC
  Filled 2018-07-02: qty 15

## 2018-07-02 MED ORDER — ACETAMINOPHEN 10 MG/ML IV SOLN
INTRAVENOUS | Status: AC
  Filled 2018-07-02: qty 100

## 2018-07-02 MED ORDER — ACETAMINOPHEN 325 MG PO TABS: 325.0000 mg | ORAL_TABLET | Freq: Four times a day (QID) | ORAL | Status: DC | PRN

## 2018-07-02 SURGICAL SUPPLY — 70 items
ATTUNE PS FEM RT SZ 4 CEM KNEE (Femur) ×2 IMPLANT
ATTUNE PSRP INSR SZ4 7 KNEE (Insert) ×2 IMPLANT
BANDAGE ACE 6X5 VEL STRL LF (GAUZE/BANDAGES/DRESSINGS) ×2 IMPLANT
BASE TIBIAL ROT PLAT SZ 5 KNEE (Knees) ×1 IMPLANT
BATTERY INSTRU NAVIGATION (MISCELLANEOUS) ×8 IMPLANT
BINDER ABDOMINAL  9 SM 30-45 (SOFTGOODS) ×1
BINDER ABDOMINAL 9 SM 30-45 (SOFTGOODS) ×1 IMPLANT
BLADE SAW 70X12.5 (BLADE) ×2 IMPLANT
BLADE SAW 90X13X1.19 OSCILLAT (BLADE) ×2 IMPLANT
BLADE SAW 90X25X1.19 OSCILLAT (BLADE) ×2 IMPLANT
CANISTER SUCT 3000ML PPV (MISCELLANEOUS) ×2 IMPLANT
CEMENT HV SMART SET (Cement) ×4 IMPLANT
COOLER POLAR GLACIER W/PUMP (MISCELLANEOUS) ×2 IMPLANT
COVER WAND RF STERILE (DRAPES) ×2 IMPLANT
CUFF TOURN SGL QUICK 24 (TOURNIQUET CUFF) ×1
CUFF TOURN SGL QUICK 30 (TOURNIQUET CUFF)
CUFF TRNQT CYL 24X4X16.5-23 (TOURNIQUET CUFF) ×1 IMPLANT
CUFF TRNQT CYL 30X4X21-28X (TOURNIQUET CUFF) IMPLANT
DRAPE SHEET LG 3/4 BI-LAMINATE (DRAPES) ×2 IMPLANT
DRSG DERMACEA 8X12 NADH (GAUZE/BANDAGES/DRESSINGS) ×2 IMPLANT
DRSG OPSITE POSTOP 4X14 (GAUZE/BANDAGES/DRESSINGS) ×2 IMPLANT
DRSG TEGADERM 4X4.75 (GAUZE/BANDAGES/DRESSINGS) ×2 IMPLANT
DURAPREP 26ML APPLICATOR (WOUND CARE) ×4 IMPLANT
ELECT REM PT RETURN 9FT ADLT (ELECTROSURGICAL) ×2
ELECTRODE REM PT RTRN 9FT ADLT (ELECTROSURGICAL) ×1 IMPLANT
EX-PIN ORTHOLOCK NAV 4X150 (PIN) ×4 IMPLANT
GAUZE PETROLATUM 1 X8 (GAUZE/BANDAGES/DRESSINGS) ×2 IMPLANT
GLOVE BIOGEL M STRL SZ7.5 (GLOVE) ×4 IMPLANT
GLOVE INDICATOR 8.0 STRL GRN (GLOVE) ×2 IMPLANT
GOWN STRL REUS W/ TWL LRG LVL3 (GOWN DISPOSABLE) ×2 IMPLANT
GOWN STRL REUS W/TWL LRG LVL3 (GOWN DISPOSABLE) ×2
HEMOVAC 400CC 10FR (MISCELLANEOUS) ×2 IMPLANT
HOLDER FOLEY CATH W/STRAP (MISCELLANEOUS) ×2 IMPLANT
HOOD PEEL AWAY FLYTE STAYCOOL (MISCELLANEOUS) ×4 IMPLANT
KIT TURNOVER KIT A (KITS) ×2 IMPLANT
KNIFE SCULPS 14X20 (INSTRUMENTS) ×2 IMPLANT
LABEL OR SOLS (LABEL) ×2 IMPLANT
MANIFOLD NEPTUNE WASTE (CANNULA) ×2 IMPLANT
NDL SAFETY ECLIPSE 18X1.5 (NEEDLE) ×1 IMPLANT
NEEDLE HYPO 18GX1.5 SHARP (NEEDLE) ×1
NEEDLE SPNL 20GX3.5 QUINCKE YW (NEEDLE) ×4 IMPLANT
NS IRRIG 500ML POUR BTL (IV SOLUTION) ×2 IMPLANT
PACK TOTAL KNEE (MISCELLANEOUS) ×2 IMPLANT
PAD WRAPON POLAR KNEE (MISCELLANEOUS) ×1 IMPLANT
PATELLA MEDIAL ATTUN 35MM KNEE (Knees) ×2 IMPLANT
PENCIL SMOKE ULTRAEVAC 22 CON (MISCELLANEOUS) ×2 IMPLANT
PIN DRILL QUICK PACK ×2 IMPLANT
PIN FIXATION 1/8DIA X 3INL (PIN) ×6 IMPLANT
PULSAVAC PLUS IRRIG FAN TIP (DISPOSABLE) ×2
SOL .9 NS 3000ML IRR  AL (IV SOLUTION) ×1
SOL .9 NS 3000ML IRR UROMATIC (IV SOLUTION) ×1 IMPLANT
SOL PREP PVP 2OZ (MISCELLANEOUS) ×2
SOLUTION PREP PVP 2OZ (MISCELLANEOUS) ×1 IMPLANT
SPONGE DRAIN TRACH 4X4 STRL 2S (GAUZE/BANDAGES/DRESSINGS) ×2 IMPLANT
STAPLER SKIN PROX 35W (STAPLE) ×2 IMPLANT
STOCKINETTE IMPERV 14X48 (MISCELLANEOUS) ×2 IMPLANT
STRAP TIBIA SHORT (MISCELLANEOUS) ×2 IMPLANT
SUCTION FRAZIER HANDLE 10FR (MISCELLANEOUS) ×1
SUCTION TUBE FRAZIER 10FR DISP (MISCELLANEOUS) ×1 IMPLANT
SUT VIC AB 0 CT1 36 (SUTURE) ×4 IMPLANT
SUT VIC AB 1 CT1 36 (SUTURE) ×4 IMPLANT
SUT VIC AB 2-0 CT2 27 (SUTURE) ×2 IMPLANT
SYR 20CC LL (SYRINGE) ×2 IMPLANT
SYR 30ML LL (SYRINGE) ×4 IMPLANT
TIBIAL BASE ROT PLAT SZ 5 KNEE (Knees) ×2 IMPLANT
TIP FAN IRRIG PULSAVAC PLUS (DISPOSABLE) ×1 IMPLANT
TOWEL OR 17X26 4PK STRL BLUE (TOWEL DISPOSABLE) ×2 IMPLANT
TOWER CARTRIDGE SMART MIX (DISPOSABLE) ×2 IMPLANT
TRAY FOLEY MTR SLVR 16FR STAT (SET/KITS/TRAYS/PACK) ×2 IMPLANT
WRAPON POLAR PAD KNEE (MISCELLANEOUS) ×2

## 2018-07-02 NOTE — Op Note (Signed)
OPERATIVE NOTE  DATE OF SURGERY:  07/02/2018  PATIENT NAME:  Alyssa Mcclure   DOB: Sep 10, 1950  MRN: [id number]  PRE-OPERATIVE DIAGNOSIS: Degenerative arthrosis of the right knee, primary  POST-OPERATIVE DIAGNOSIS:  Same  PROCEDURE:  Right total knee arthroplasty using computer-assisted navigation  SURGEON:  Jena Gauss. M.D.  ANESTHESIA: spinal  ESTIMATED BLOOD LOSS: 50 mL  FLUIDS REPLACED: 1400 mL of crystalloid  TOURNIQUET TIME: 94 minutes  DRAINS: 2 medium Hemovac drains  SOFT TISSUE RELEASES: Anterior cruciate ligament, posterior cruciate ligament, deep medial collateral ligament, patellofemoral ligament  IMPLANTS UTILIZED: DePuy Attune size 4 posterior stabilized femoral component (cemented), size 5 rotating platform tibial component (cemented), 35 mm medialized dome patella (cemented), and a 7 mm stabilized rotating platform polyethylene insert.  INDICATIONS FOR SURGERY: Alyssa Mcclure is a 68 y.o. year old female with a long history of progressive knee pain. X-rays demonstrated severe degenerative changes in tricompartmental fashion. The patient had not seen any significant improvement despite conservative nonsurgical intervention. After discussion of the risks and benefits of surgical intervention, the patient expressed understanding of the risks benefits and agree with plans for total knee arthroplasty.   The risks, benefits, and alternatives were discussed at length including but not limited to the risks of infection, bleeding, nerve injury, stiffness, blood clots, the need for revision surgery, cardiopulmonary complications, among others, and they were willing to proceed.  PROCEDURE IN DETAIL: The patient was brought into the operating room and, after adequate spinal anesthesia was achieved, a tourniquet was placed on the patient's upper thigh. The patient's knee and leg were cleaned and prepped with alcohol and DuraPrep and draped in the usual sterile fashion. A  "timeout" was performed as per usual protocol. The lower extremity was exsanguinated using an Esmarch, and the tourniquet was inflated to 300 mmHg. An anterior longitudinal incision was made followed by a standard mid vastus approach. The deep fibers of the medial collateral ligament were elevated in a subperiosteal fashion off of the medial flare of the tibia so as to maintain a continuous soft tissue sleeve. The patella was subluxed laterally and the patellofemoral ligament was incised. Inspection of the knee demonstrated severe degenerative changes with full-thickness loss of articular cartilage. Osteophytes were debrided using a rongeur. Anterior and posterior cruciate ligaments were excised. Two 4.0 mm Schanz pins were inserted in the femur and into the tibia for attachment of the array of trackers used for computer-assisted navigation. Hip center was identified using a circumduction technique. Distal landmarks were mapped using the computer. The distal femur and proximal tibia were mapped using the computer. The distal femoral cutting guide was positioned using computer-assisted navigation so as to achieve a 5 distal valgus cut. The femur was sized and it was felt that a size 4 femoral component was appropriate. A size 4 femoral cutting guide was positioned and the anterior cut was performed and verified using the computer. This was followed by completion of the posterior and chamfer cuts. Femoral cutting guide for the central box was then positioned in the center box cut was performed.  Attention was then directed to the proximal tibia. Medial and lateral menisci were excised. The extramedullary tibial cutting guide was positioned using computer-assisted navigation so as to achieve a 0 varus-valgus alignment and 3 posterior slope. The cut was performed and verified using the computer. The proximal tibia was sized and it was felt that a size 5 tibial tray was appropriate. Tibial and femoral trials were  inserted followed  by insertion of a 7 mm polyethylene insert. This allowed for excellent mediolateral soft tissue balancing both in flexion and in full extension. Finally, the patella was cut and prepared so as to accommodate a 35 mm medialized dome patella. A patella trial was placed and the knee was placed through a range of motion with excellent patellar tracking appreciated. The femoral trial was removed after debridement of posterior osteophytes. The central post-hole for the tibial component was reamed followed by insertion of a keel punch. Tibial trials were then removed. Cut surfaces of bone were irrigated with copious amounts of normal saline with antibiotic solution using pulsatile lavage and then suctioned dry. Polymethylmethacrylate cement was prepared in the usual fashion using a vacuum mixer. Cement was applied to the cut surface of the proximal tibia as well as along the undersurface of a size 5 rotating platform tibial component. Tibial component was positioned and impacted into place. Excess cement was removed using Personal assistant. Cement was then applied to the cut surfaces of the femur as well as along the posterior flanges of the size 4 femoral component. The femoral component was positioned and impacted into place. Excess cement was removed using Personal assistant. A 7 mm polyethylene trial was inserted and the knee was brought into full extension with steady axial compression applied. Finally, cement was applied to the backside of a 35 mm medialized dome patella and the patellar component was positioned and patellar clamp applied. Excess cement was removed using Personal assistant. After adequate curing of the cement, the tourniquet was deflated after a total tourniquet time of 94 minutes. Hemostasis was achieved using electrocautery. The knee was irrigated with copious amounts of normal saline with antibiotic solution using pulsatile lavage and then suctioned dry. 20 mL of 1.3% Exparel and 60 mL  of 0.25% Marcaine in 40 mL of normal saline was injected along the posterior capsule, medial and lateral gutters, and along the arthrotomy site. A 7 mm stabilized rotating platform polyethylene insert was inserted and the knee was placed through a range of motion with excellent mediolateral soft tissue balancing appreciated and excellent patellar tracking noted. 2 medium drains were placed in the wound bed and brought out through separate stab incisions. The medial parapatellar portion of the incision was reapproximated using interrupted sutures of #1 Vicryl. Subcutaneous tissue was approximated in layers using first #0 Vicryl followed #2-0 Vicryl. The skin was approximated with skin staples. A sterile dressing was applied.  The patient tolerated the procedure well and was transported to the recovery room in stable condition.    James P. Angie Fava., M.D.

## 2018-07-02 NOTE — Anesthesia Procedure Notes (Signed)
Spinal  Patient location during procedure: OR Start time: 07/02/2018 7:24 AM End time: 07/02/2018 7:28 AM Staffing Anesthesiologist: Emmie Niemann, MD Performed: anesthesiologist  Preanesthetic Checklist Completed: patient identified, site marked, surgical consent, pre-op evaluation, timeout performed, IV checked, risks and benefits discussed and monitors and equipment checked Spinal Block Patient position: sitting Prep: ChloraPrep and DuraPrep Patient monitoring: heart rate, cardiac monitor, continuous pulse ox and blood pressure Approach: midline Location: L3-4 Injection technique: single-shot Needle Needle type: Introducer and Pencil-Tip  Needle gauge: 24 G Needle length: 9 cm Assessment Sensory level: T4 Additional Notes Negative parasthesia, free flowing CSF

## 2018-07-02 NOTE — H&P (Signed)
The patient has been re-examined, and the chart reviewed, and there have been no interval changes to the documented history and physical.    The risks, benefits, and alternatives have been discussed at length. The patient expressed understanding of the risks benefits and agreed with plans for surgical intervention.  James P. Hooten, Jr. M.D.    

## 2018-07-02 NOTE — Evaluation (Signed)
Physical Therapy Evaluation Patient Details Name: Alyssa Mcclure MRN: [id number] DOB: [birth year] Today's Date: 07/02/2018   History of Present Illness  Pt admitted for R TKR.  Clinical Impression  Pt is a pleasant 68 year old female who was admitted for R TKR. Pt performs bed mobility, transfers, and ambulation with cga and RW. Pt demonstrates ability to perform 10 SLRs with independence, therefore does not require KI for mobility. Pt demonstrates deficits with ROM/strength/mobility. Would benefit from skilled PT to address above deficits and promote optimal return to PLOF. Recommend transition to HHPT upon discharge from acute hospitalization.     Follow Up Recommendations Home health PT;Supervision for mobility/OOB    Equipment Recommendations  Rolling walker with 5" wheels;3in1 (PT)    Recommendations for Other Services       Precautions / Restrictions Precautions Precautions: Knee;Fall Precaution Booklet Issued: No Restrictions Weight Bearing Restrictions: Yes RLE Weight Bearing: Weight bearing as tolerated      Mobility  Bed Mobility Overal bed mobility: Needs Assistance Bed Mobility: Supine to Sit     Supine to sit: Min guard     General bed mobility comments: safe technique with cues for sequencing  Transfers Overall transfer level: Needs assistance Equipment used: Rolling walker (2 wheeled) Transfers: Sit to/from Stand Sit to Stand: Min guard         General transfer comment: slight unsteadiness with upright posture. RW. Needs min assist for LOB.  Ambulation/Gait Ambulation/Gait assistance: Min guard Gait Distance (Feet): 5 Feet Assistive device: Rolling walker (2 wheeled) Gait Pattern/deviations: Step-to pattern     General Gait Details: needs heavy cues for sequencing of RW. Good stance time noted on R LE.   Stairs            Wheelchair Mobility    Modified Rankin (Stroke Patients Only)       Balance Overall balance assessment:  Needs assistance Sitting-balance support: No upper extremity supported;Feet supported Sitting balance-Leahy Scale: Good     Standing balance support: Bilateral upper extremity supported Standing balance-Leahy Scale: Good                               Pertinent Vitals/Pain Pain Assessment: 0-10 Pain Score: 6  Pain Location: R knee Pain Descriptors / Indicators: Operative site guarding Pain Intervention(s): Limited activity within patient's tolerance;Ice applied;Repositioned    Home Living Family/patient expects to be discharged to:: Private residence Living Arrangements: Alone Available Help at Discharge: Family(will be dc to niece's house at DC) Type of Home: House Home Access: Stairs to enter Entrance Stairs-Rails: Can reach both Entrance Stairs-Number of Steps: 10 Home Layout: Two level;Able to live on main level with bedroom/bathroom Home Equipment: Cane - single point      Prior Function Level of Independence: Independent         Comments: was having extreme pain prior to sx, limiting ambulation     Hand Dominance        Extremity/Trunk Assessment   Upper Extremity Assessment Upper Extremity Assessment: Overall WFL for tasks assessed    Lower Extremity Assessment Lower Extremity Assessment: Generalized weakness(R LE grossly 3/5; L LE grossly 5/5)       Communication   Communication: No difficulties  Cognition Arousal/Alertness: Awake/alert Behavior During Therapy: WFL for tasks assessed/performed Overall Cognitive Status: Within Functional Limits for tasks assessed  General Comments      Exercises Total Joint Exercises Goniometric ROM: R knee AAROM: 0-65 degrees Other Exercises Other Exercises: Supine ther-ex performed on R LE including AP, SLRs, hip abd/add, knee flexion stretches and SAQ. All ther-ex performed x 10 reps with cga. Pt also educated on IS use, able to perform 2  reps correctly.   Assessment/Plan    PT Assessment Patient needs continued PT services  PT Problem List Decreased strength;Decreased balance;Decreased mobility;Decreased knowledge of use of DME;Pain;Decreased range of motion       PT Treatment Interventions DME instruction;Gait training;Stair training;Therapeutic exercise    PT Goals (Current goals can be found in the Care Plan section)  Acute Rehab PT Goals Patient Stated Goal: to get stronger PT Goal Formulation: With patient Time For Goal Achievement: 07/16/18 Potential to Achieve Goals: Good    Frequency BID   Barriers to discharge        Co-evaluation               AM-PAC PT "6 Clicks" Mobility  Outcome Measure Help needed turning from your back to your side while in a flat bed without using bedrails?: None Help needed moving from lying on your back to sitting on the side of a flat bed without using bedrails?: A Little Help needed moving to and from a bed to a chair (including a wheelchair)?: A Little Help needed standing up from a chair using your arms (e.g., wheelchair or bedside chair)?: A Little Help needed to walk in hospital room?: A Lot Help needed climbing 3-5 steps with a railing? : A Lot 6 Click Score: 17    End of Session Equipment Utilized During Treatment: Gait belt Activity Tolerance: Patient tolerated treatment well Patient left: in chair;with chair alarm set;with SCD's reapplied Nurse Communication: Mobility status PT Visit Diagnosis: Muscle weakness (generalized) (M62.81);Difficulty in walking, not elsewhere classified (R26.2);Pain Pain - Right/Left: Right Pain - part of body: Knee    Time: 4332-9518 PT Time Calculation (min) (ACUTE ONLY): 38 min   Charges:   PT Evaluation $PT Eval Low Complexity: 1 Low PT Treatments $Therapeutic Exercise: 23-37 mins        Elizabeth Palau, PT, DPT [phone]   Alyssa Mcclure 07/02/2018, 4:43 PM

## 2018-07-02 NOTE — OR Nursing (Signed)
hemovac activated at 1057

## 2018-07-02 NOTE — Anesthesia Post-op Follow-up Note (Signed)
Anesthesia QCDR form completed.        

## 2018-07-02 NOTE — Discharge Instructions (Signed)
°  Instructions after Total Knee Replacement ° ° Taye Cato P. Lean Fayson, Jr., M.D.    ° Dept. of Orthopaedics & Sports Medicine ° [facility] ° [address] ° [place], [place]  [address] ° Phone: [phone]   Fax: [phone] ° °  °DIET: °• Drink plenty of non-alcoholic fluids. °• Resume your normal diet. Include foods high in fiber. ° °ACTIVITY:  °• You may use crutches or a walker with weight-bearing as tolerated, unless instructed otherwise. °• You may be weaned off of the walker or crutches by your Physical Therapist.  °• Do NOT place pillows under the knee. Anything placed under the knee could limit your ability to straighten the knee.   °• Continue doing gentle exercises. Exercising will reduce the pain and swelling, increase motion, and prevent muscle weakness.   °• Please continue to use the TED compression stockings for 6 weeks. You may remove the stockings at night, but should reapply them in the morning. °• Do not drive or operate any equipment until instructed. ° °WOUND CARE:  °• Continue to use the PolarCare or ice packs periodically to reduce pain and swelling. °• You may bathe or shower after the staples are removed at the first office visit following surgery. ° °MEDICATIONS: °• You may resume your regular medications. °• Please take the pain medication as prescribed on the medication. °• Do not take pain medication on an empty stomach. °• You have been given a prescription for a blood thinner (Lovenox or Coumadin). Please take the medication as instructed. (NOTE: After completing a 2 week course of Lovenox, take one Enteric-coated aspirin once a day. This along with elevation will help reduce the possibility of phlebitis in your operated leg.) °• Do not drive or drink alcoholic beverages when taking pain medications. ° °CALL THE OFFICE FOR: °• Temperature above 101 degrees °• Excessive bleeding or drainage on the dressing. °• Excessive swelling, coldness, or paleness of the toes. °• Persistent  nausea and vomiting. ° °FOLLOW-UP:  °• You should have an appointment to return to the office in 10-14 days after surgery. °• Arrangements have been made for continuation of Physical Therapy (either home therapy or outpatient therapy). °  °

## 2018-07-02 NOTE — TOC Progression Note (Signed)
Transition of Care [facility]) - Progression Note    Patient Details  Name: Alyssa Mcclure MRN: [id number] Date of Birth: 10-02-1950  Transition of Care [facility]) CM/SW Contact  Javone Ybanez, Lenice Llamas Phone Number: [phone]  07/02/2018, 2:24 PM  Clinical Narrative: Lovenox price requested.            Expected Discharge Plan and Services                                                 Social Determinants of Health (SDOH) Interventions    Readmission Risk Interventions No flowsheet data found.

## 2018-07-02 NOTE — Progress Notes (Signed)
Pt admitted to unit from PACU.  Pt is A&Ox4, VSS. Pt oriented to room and plan of care. Foley draining, hemovac charged, surgical CDI. Bone foam applied. Pt can bend both knees and slightly move toes. Bed in lowest position, call bell within reach.

## 2018-07-02 NOTE — Anesthesia Preprocedure Evaluation (Signed)
Anesthesia Evaluation  Patient identified by MRN, date of birth, ID band Patient awake    Reviewed: Allergy & Precautions, NPO status , Patient's Chart, lab work & pertinent test results  History of Anesthesia Complications Negative for: history of anesthetic complications  Airway Mallampati: II  TM Distance: >3 FB Neck ROM: Full    Dental no notable dental hx.    Pulmonary sleep apnea (mild, does not have CPAP) , neg COPD, former smoker,    breath sounds clear to auscultation- rhonchi (-) wheezing      Cardiovascular hypertension, Pt. on medications (-) CAD, (-) Past MI and (-) CABG  Rhythm:Regular Rate:Normal - Systolic murmurs and - Diastolic murmurs    Neuro/Psych neg Seizures PSYCHIATRIC DISORDERS Depression negative neurological ROS     GI/Hepatic Neg liver ROS, GERD  ,  Endo/Other  Diabetes: prediabetes.  Renal/GU negative Renal ROS     Musculoskeletal  (+) Arthritis ,   Abdominal (+) + obese,   Peds  Hematology negative hematology ROS (+)   Anesthesia Other Findings Past Medical History: No date: Arthritis No date: Depression No date: GERD (gastroesophageal reflux disease) No date: H/O Bell's palsy No date: Heart murmur No date: HLD (hyperlipidemia) No date: HOH (hard of hearing) No date: Hypertension No date: Pre-diabetes   Reproductive/Obstetrics                             Lab Results  Component Value Date   WBC 6.7 06/24/2018   HGB 12.7 06/24/2018   HCT 38.8 06/24/2018   MCV 91.9 06/24/2018   PLT 216 06/24/2018    Anesthesia Physical Anesthesia Plan  ASA: III  Anesthesia Plan: Spinal   Post-op Pain Management:    Induction:   PONV Risk Score and Plan: 2 and Propofol infusion  Airway Management Planned: Natural Airway  Additional Equipment:   Intra-op Plan:   Post-operative Plan:   Informed Consent: I have reviewed the patients History and  Physical, chart, labs and discussed the procedure including the risks, benefits and alternatives for the proposed anesthesia with the patient or authorized representative who has indicated his/her understanding and acceptance.     Dental advisory given  Plan Discussed with: CRNA and Anesthesiologist  Anesthesia Plan Comments:         Anesthesia Quick Evaluation

## 2018-07-02 NOTE — Progress Notes (Signed)
Right eye hurts   Dr Randa Lynn in to check pt   Will order eye drops for her

## 2018-07-02 NOTE — Transfer of Care (Signed)
Immediate Anesthesia Transfer of Care Note  Patient: ELOUISE DIVELBISS  Procedure(s) Performed: COMPUTER ASSISTED TOTAL KNEE ARTHROPLASTY RIGHT (Right Knee)  Patient Location: PACU  Anesthesia Type:GA combined with regional for post-op pain  Level of Consciousness: awake, alert , oriented and patient cooperative  Airway & Oxygen Therapy: Patient Spontanous Breathing and Patient connected to face mask oxygen  Post-op Assessment: Report given to RN and Post -op Vital signs reviewed and stable  Post vital signs: Reviewed and stable  Last Vitals:  Vitals Value Taken Time  BP 103/43 07/02/18 1111  Temp 36.6 C 07/02/18 1110  Pulse 78 07/02/18 1120  Resp 5 07/02/18 1120  SpO2 100 % 07/02/18 1120  Vitals shown include unvalidated device data.  Last Pain:  Vitals:   07/02/18 1110  TempSrc:   PainSc: 0-No pain         Complications: No apparent anesthesia complications

## 2018-07-03 ENCOUNTER — Encounter: Payer: Self-pay | Admitting: Orthopedic Surgery

## 2018-07-03 NOTE — Evaluation (Signed)
Occupational Therapy Evaluation Patient Details Name: Alyssa Mcclure MRN: [id number] DOB: 02/08/1950 Today's Date: 07/03/2018    History of Present Illness Pt admitted for R TKR.   Clinical Impression   Pt seen for OT evaluation this date, POD#1 from above surgery. Pt was independent in all ADL prior to surgery, however becoming increasingly limited with mobility and ADL 2/2 R knee pain. Pt is eager to return to PLOF with less pain and improved safety and independence. Pt plans to stay with her niece following discharge for a period of time while recovering. 5 steps to enter her niece's home. Pt currently requires minimal assist for LB dressing while in seated position due to pain and limited AROM of R knee. Pt instructed in polar care mgt, falls prevention strategies including pet care considerations, home/routines modifications, DME/AE for LB bathing and dressing tasks, and compression stocking mgt. Pt would benefit from skilled OT services including additional instruction in dressing techniques with or without assistive devices for dressing and bathing skills to support recall and carryover prior to discharge and ultimately to maximize safety, independence, and minimize falls risk and caregiver burden. Do not currently anticipate any OT needs following this hospitalization.      Follow Up Recommendations  No OT follow up    Equipment Recommendations  3 in 1 bedside commode    Recommendations for Other Services       Precautions / Restrictions Precautions Precautions: Knee;Fall Precaution Booklet Issued: No Restrictions Weight Bearing Restrictions: Yes RLE Weight Bearing: Weight bearing as tolerated      Mobility Bed Mobility     General bed mobility comments: up in recliner at start and end of session  Transfers Overall transfer level: Needs assistance Equipment used: Rolling walker (2 wheeled) Transfers: Sit to/from Stand Sit to Stand: Min guard         General  transfer comment: slight unsteadiness with upright posture. RW. Needs min assist for LOB.    Balance Overall balance assessment: Needs assistance Sitting-balance support: No upper extremity supported;Feet supported Sitting balance-Leahy Scale: Good     Standing balance support: Bilateral upper extremity supported Standing balance-Leahy Scale: Fair                             ADL either performed or assessed with clinical judgement   ADL Overall ADL's : Needs assistance/impaired                                       General ADL Comments: CGA for toilet transfers, Min A for LB dressing and bathing, Max A for compression stocking mgt     Vision Patient Visual Report: No change from baseline       Perception     Praxis      Pertinent Vitals/Pain Pain Assessment: 0-10 Pain Score: 5  Pain Location: R knee Pain Descriptors / Indicators: Sore;Discomfort Pain Intervention(s): Limited activity within patient's tolerance;Monitored during session;Ice applied     Hand Dominance     Extremity/Trunk Assessment Upper Extremity Assessment Upper Extremity Assessment: Overall WFL for tasks assessed   Lower Extremity Assessment Lower Extremity Assessment: Defer to PT evaluation;RLE deficits/detail RLE Deficits / Details: expected post-op strength/ROM deficits 2/2 R TKA   Cervical / Trunk Assessment Cervical / Trunk Assessment: Normal   Communication Communication Communication: No difficulties   Cognition Arousal/Alertness: Awake/alert Behavior During  Therapy: WFL for tasks assessed/performed Overall Cognitive Status: Within Functional Limits for tasks assessed                                     General Comments       Exercises Other Exercises Other Exercises: pt instructed in falls prevention, compression stocking mgt, polar care mgt, pet care considerations (niece has 3 dogs), AE/DME for ADL tasks, and home/routines  modifications   Shoulder Instructions      Home Living Family/patient expects to be discharged to:: Private residence Living Arrangements: Alone Available Help at Discharge: Family(will be dc to niece's house at DC) Type of Home: House Home Access: Stairs to enter [employer] of Steps: 10 Entrance Stairs-Rails: Can reach both Home Layout: Two level;Able to live on main level with bedroom/bathroom     Bathroom Shower/Tub: Producer, television/film/video: Handicapped height     Home Equipment: Cane - single point;Shower seat - built Designer, fashion/clothing: Reacher        Prior Functioning/Environment Level of Independence: Independent        Comments: was having extreme pain prior to sx, limiting ambulation but indep with ADL        OT Problem List: Decreased strength;Pain;Decreased range of motion;Impaired balance (sitting and/or standing);Decreased knowledge of use of DME or AE      OT Treatment/Interventions: Self-care/ADL training;Therapeutic activities;Therapeutic exercise;Patient/family education;DME and/or AE instruction;Balance training    OT Goals(Current goals can be found in the care plan section) Acute Rehab OT Goals Patient Stated Goal: to get stronger and return to PLOF OT Goal Formulation: With patient Time For Goal Achievement: 07/17/18 Potential to Achieve Goals: Good ADL Goals Pt Will Perform Lower Body Dressing: with min guard assist;sit to/from stand;with adaptive equipment Pt Will Transfer to Toilet: with supervision;ambulating(BSC over toilet, LRAD for amb) Additional ADL Goal #1: Pt will independently instruct family/caregiver in compression stocking mgt including donning/doffing, wear schedule, and positioning. Additional ADL Goal #2: Pt will independently instruct family/caregiver in polar care mgt including donning/doffing, wear schedule, and positioning.  OT Frequency: Min 1X/week   Barriers to D/C:             Co-evaluation              AM-PAC OT "6 Clicks" Daily Activity     Outcome Measure Help from another person eating meals?: None Help from another person taking care of personal grooming?: None Help from another person toileting, which includes using toliet, bedpan, or urinal?: A Little Help from another person bathing (including washing, rinsing, drying)?: A Little Help from another person to put on and taking off regular upper body clothing?: None Help from another person to put on and taking off regular lower body clothing?: A Little 6 Click Score: 21   End of Session    Activity Tolerance: Patient tolerated treatment well Patient left: in chair;with call bell/phone within reach;with chair alarm set;with SCD's reapplied;Other (comment)(polar care in place)  OT Visit Diagnosis: Other abnormalities of gait and mobility (R26.89);Pain Pain - Right/Left: Right Pain - part of body: Hip                Time: 5956-3875 OT Time Calculation (min): 15 min Charges:  OT General Charges $OT Visit: 1 Visit OT Evaluation $OT Eval Low Complexity: 1 Low OT Treatments $Self Care/Home Management : 8-22 mins  Richrd Prime, MPH, MS,  OTR/L ascom [phone] 07/03/18, 9:45 AM

## 2018-07-03 NOTE — TOC Initial Note (Signed)
Transition of Care [facility]) - Initial/Assessment Note    Patient Details  Name: Alyssa Mcclure MRN: [id number] Date of Birth: Feb 01, 1950  Transition of Care [facility]) CM/SW Contact:    Keita Demarco, Arelia Longest Phone Number: [phone]  07/03/2018, 11:20 AM  Clinical Narrative: PT is recommending home health. Clinical Social Worker (CSW) met with patient to discuss D/C plan. Patient was alert and oriented X4 and was sitting up in the chair at bedside. CSW introduced self and explained role of CSW department. Per patient she is going to stay at her niece Ledell Noss home located at [address] [place], [place] [address]. Michelle's phone # [phone]. CSW contacted Marcelino Duster with patient's permission and she confirmed the plan. CSW explained that patient's surgeon prefers [facility] for home health and patient is agreeable to using [facility]. Rosey Bath [facility] home health representative is aware of above. Patient reported that she needs a rolling walker and does not need a bedside commode. Brad Adpat DME agency representative is aware of above. Patient was notified of her Lovenox price $100.54. CSW will continue to follow and assist as needed.                Expected Discharge Plan: Home w Home Health Services Barriers to Discharge: Continued Medical Work up   Patient Goals and CMS Choice Patient states their goals for this hospitalization and ongoing recovery are:: Improve mobility CMS Medicare.gov Compare Post Acute Care list provided to:: (Patient's surgeon's office arranged home health with [facility] prior to hospital stay.) Choice offered to / list presented to : Patient(Patient's surgeon's office arranged home health with [facility] prior to hospital stay.)  Expected Discharge Plan and Services Expected Discharge Plan: Home w Home Health Services In-house Referral: Clinical Social Work Discharge Planning Services: CM Consult   Living arrangements for the past 2 months: Single Family Home     DME Arranged: Walker rolling DME Agency: [employer] Date DME Agency Contacted: 07/03/18 Time DME Agency Contacted: [phone] Representative spoke with at DME Agency: Nida Boatman HH Arranged: PT HH Agency: [facility] (formerly [facility]) Date HH Agency Contacted: 07/03/18 Time HH Agency Contacted: 1000 Representative spoke with at [facility] Agency: Rosey Bath  Prior Living Arrangements/Services Living arrangements for the past 2 months: Single Family Home Lives with:: Self Patient language and need for interpreter reviewed:: No Do you feel safe going back to the place where you live?: Yes      Need for Family Participation in Patient Care: Yes (Comment) Care giver support system in place?: Yes (comment)   Criminal Activity/Legal Involvement Pertinent to Current Situation/Hospitalization: No - Comment as needed  Activities of Daily Living Home Assistive Devices/Equipment: Eyeglasses, Blood pressure cuff ADL Screening (condition at time of admission) Patient's cognitive ability adequate to safely complete daily activities?: Yes Is the patient deaf or have difficulty hearing?: Yes Does the patient have difficulty seeing, even when wearing glasses/contacts?: No Does the patient have difficulty concentrating, remembering, or making decisions?: No Patient able to express need for assistance with ADLs?: Yes Does the patient have difficulty dressing or bathing?: No Independently performs ADLs?: Yes (appropriate for developmental age) Does the patient have difficulty walking or climbing stairs?: No Weakness of Legs: Both Weakness of Arms/Hands: None  Permission Sought/Granted Permission sought to share information with : (Home Health and DME agecny) Permission granted to share information with : Yes, Verbal Permission Granted              Emotional Assessment Appearance:: Appears stated age  Affect (typically observed): Calm, Pleasant Orientation: : Oriented to Self, Oriented to  Place, Oriented to  Time, Oriented to Situation Alcohol / Substance Use: Not Applicable Psych Involvement: No (comment)  Admission diagnosis:  PRIMARY OSTEOARTHRITIS OF RIGHT KNEE Patient Active Problem List   Diagnosis Date Noted  . Total knee replacement status 07/02/2018  . Chest heaviness 06/20/2018  . Positive cardiac stress test 06/20/2018  . Primary osteoarthritis of both knees 02/09/2018  . Obstructive sleep apnea 03/02/2016  . Nasal valve collapse 03/02/2016  . Nasal vestibulitis 03/02/2016  . Hyperlipidemia, unspecified 05/04/2014  . Impingement syndrome of shoulder, right 02/16/2014  . Gastroesophageal reflux disease 12/17/2013  . Pure hypercholesterolemia 10/20/2013  . Elevated fasting blood sugar 10/20/2013  . Depression 10/20/2013  . Benign essential hypertension 06/10/2013  . Derangement of posterior horn of medial meniscus 06/10/2013   PCP:  [name], MD Pharmacy:   [facility] [address], [place] - [address] [place] [address] Phone: [phone] Fax: [phone]     Social Determinants of Health (SDOH) Interventions    Readmission Risk Interventions No flowsheet data found.

## 2018-07-03 NOTE — TOC Benefit Eligibility Note (Signed)
Transition of Care [facility]) Benefit Eligibility Note    Patient Details  Name: Alyssa Mcclure MRN: [id number] Date of Birth: 1950-08-02   Medication/Dose: Enoxaparin 34m daily x14 days  Covered?: Yes(Generic - Enoxaparin)  Tier: Other(Tier 4)  Prescription Coverage [facility]erred Pharmacy: [facility]  Spoke with Person/Company/Phone Number:: SMickie KayMedicare, [phone] Co-Pay: $100.54  Prior Approval: No  Deductible: Met  Additional Notes: Lovenox not on formulary    [place] RunPhone Number: 07/03/2018, 8:56 AM

## 2018-07-03 NOTE — Progress Notes (Signed)
  Subjective: 1 Day Post-Op Procedure(s) (LRB): COMPUTER ASSISTED TOTAL KNEE ARTHROPLASTY RIGHT (Right) Patient reports pain as mild.   Patient seen in rounds with Dr. Marry Guan. Patient is well, and has had no acute complaints or problems Plan is to go Home after hospital stay. Negative for chest pain and shortness of breath Fever: no Gastrointestinal: Negative for nausea and vomiting  Objective: Vital signs in last 24 hours: Temp:  [97.5 F (36.4 C)-98.1 F (36.7 C)] 97.5 F (36.4 C) (06/25 0426) Pulse Rate:  [52-99] 52 (06/25 0426) Resp:  [3-18] 15 (06/25 0426) BP: (96-119)/(43-73) 119/65 (06/25 0426) SpO2:  [94 %-100 %] 98 % (06/25 0426)  Intake/Output from previous day:  Intake/Output Summary (Last 24 hours) at 07/03/2018 0638 Last data filed at 07/03/2018 0615 Gross per 24 hour  Intake 4196.65 ml  Output 1055 ml  Net 3141.65 ml    Intake/Output this shift: Total I/O In: 1237.3 [I.V.:1030.7; IV Piggyback:206.7] Out: 525 [Urine:425; Drains:100]  Labs: No results for input(s): HGB in the last 72 hours. No results for input(s): WBC, RBC, HCT, PLT in the last 72 hours. No results for input(s): NA, K, CL, CO2, BUN, CREATININE, GLUCOSE, CALCIUM in the last 72 hours. No results for input(s): LABPT, INR in the last 72 hours.   EXAM General - Patient is Alert and Oriented Extremity - Neurovascular intact Sensation intact distally Compartment soft Dressing/Incision - clean, dry, with a Hemovac intact Motor Function - intact, moving foot and toes well on exam.  Able to straight leg raise independently.  Past Medical History:  Diagnosis Date  . Arthritis   . Depression   . GERD (gastroesophageal reflux disease)   . H/O Bell's palsy   . Heart murmur   . HLD (hyperlipidemia)   . HOH (hard of hearing)   . Hypertension   . Pre-diabetes     Assessment/Plan: 1 Day Post-Op Procedure(s) (LRB): COMPUTER ASSISTED TOTAL KNEE ARTHROPLASTY RIGHT (Right) Active Problems:  Total knee replacement status  Estimated body mass index is 30.12 kg/m as calculated from the following:   Height as of this encounter: 5\' 5"  (1.651 m).   Weight as of this encounter: 82.1 kg. Advance diet Up with therapy D/C IV fluids Discharge home with home health planned for tomorrow  DVT Prophylaxis - Lovenox, Foot Pumps and TED hose Weight-Bearing as tolerated to right leg  Reche Dixon, PA-C Orthopaedic Surgery 07/03/2018, 6:38 AM

## 2018-07-03 NOTE — Progress Notes (Signed)
Physical Therapy Treatment Patient Details Name: Alyssa Mcclure MRN: [id number] DOB: 1950-07-03 Today's Date: 07/03/2018    History of Present Illness Pt admitted for R TKR.    PT Comments    Pt in recliner.  Fatigued and ready to walk/get back to bed.  Stood with min guard and was able to walk in hallway 100' with walker and min guard.  She did hyperextend a couple times but was able to control it without LOB.  Overall improved gait quality and speed this session.  Returned to bed with ease.  Pt stated she is hoping for discharge tomorrow.  Will need stair training in AM.   Follow Up Recommendations  Home health PT;Supervision for mobility/OOB     Equipment Recommendations  Rolling walker with 5" wheels;3in1 (PT)    Recommendations for Other Services       Precautions / Restrictions Precautions Precautions: Knee;Fall Precaution Booklet Issued: No Restrictions Weight Bearing Restrictions: Yes RLE Weight Bearing: Weight bearing as tolerated    Mobility  Bed Mobility Overal bed mobility: Needs Assistance Bed Mobility: Sit to Supine     Supine to sit: Min guard Sit to supine: Supervision   General bed mobility comments: returned to bed with ease after session  Transfers Overall transfer level: Needs assistance Equipment used: Rolling walker (2 wheeled) Transfers: Sit to/from Stand Sit to Stand: Min guard         General transfer comment: slight unsteadiness with upright posture. RW. Needs min assist for LOB.  Ambulation/Gait Ambulation/Gait assistance: Min guard;Min assist Gait Distance (Feet): 100 Feet Assistive device: Rolling walker (2 wheeled) Gait Pattern/deviations: Step-to pattern;Step-through pattern;Decreased stride length Gait velocity: decreased   General Gait Details: Progressing well with smoother gait patten this session,   Stairs             Wheelchair Mobility    Modified Rankin (Stroke Patients Only)       Balance Overall  balance assessment: Needs assistance Sitting-balance support: No upper extremity supported;Feet supported Sitting balance-Leahy Scale: Good     Standing balance support: Bilateral upper extremity supported Standing balance-Leahy Scale: Fair                              Cognition Arousal/Alertness: Awake/alert Behavior During Therapy: WFL for tasks assessed/performed Overall Cognitive Status: Within Functional Limits for tasks assessed                                        Exercises Total Joint Exercises Ankle Circles/Pumps: AROM;10 reps;Supine Long Arc Quad: AAROM;Right;10 reps;Seated Knee Flexion: AAROM;Right;Seated;5 reps Goniometric ROM: 0-90 Other Exercises Other Exercises: sitting LAQ x 10 Other Exercises: pt instructed in falls prevention, compression stocking mgt, polar care mgt, pet care considerations (niece has 3 dogs), AE/DME for ADL tasks, and home/routines modifications    General Comments        Pertinent Vitals/Pain Pain Assessment: Faces Pain Score: 5  Faces Pain Scale: Hurts little more Pain Location: R knee Pain Descriptors / Indicators: Sore;Discomfort Pain Intervention(s): Limited activity within patient's tolerance;Monitored during session;Ice applied    Home Living Family/patient expects to be discharged to:: Private residence Living Arrangements: Alone Available Help at Discharge: Family(will be dc to niece's house at DC) Type of Home: House Home Access: Stairs to enter Entrance Stairs-Rails: Can reach both Home Layout: Two level;Able to live on  main level with bedroom/bathroom Home Equipment: Cane - single point;Shower seat - built Scientist, clinical (histocompatibility and immunogenetics)      Prior Function Level of Independence: Independent      Comments: was having extreme pain prior to sx, limiting ambulation but indep with ADL   PT Goals (current goals can now be found in the care plan section) Acute Rehab PT Goals Patient Stated Goal: to  get stronger and return to PLOF Progress towards PT goals: Progressing toward goals    Frequency    BID      PT Plan Current plan remains appropriate    Co-evaluation              AM-PAC PT "6 Clicks" Mobility   Outcome Measure  Help needed turning from your back to your side while in a flat bed without using bedrails?: None Help needed moving from lying on your back to sitting on the side of a flat bed without using bedrails?: None Help needed moving to and from a bed to a chair (including a wheelchair)?: A Little Help needed standing up from a chair using your arms (e.g., wheelchair or bedside chair)?: A Little Help needed to walk in hospital room?: A Little Help needed climbing 3-5 steps with a railing? : A Lot 6 Click Score: 19    End of Session Equipment Utilized During Treatment: Gait belt Activity Tolerance: Patient tolerated treatment well Patient left: in bed;with call bell/phone within reach;with bed alarm set;with SCD's reapplied;Other (comment)   Pain - Right/Left: Right Pain - part of body: Knee     Time: 1308-6578 PT Time Calculation (min) (ACUTE ONLY): 10 min  Charges:  $Gait Training: 8-22 mins                     Danielle Dess, PTA 07/03/18, 1:05 PM

## 2018-07-03 NOTE — Progress Notes (Signed)
Physical Therapy Treatment Patient Details Name: Alyssa Mcclure MRN: [id number] DOB: [birth year] Today's Date: 07/03/2018    History of Present Illness Pt admitted for R TKR.    PT Comments    Pt needing to use bathroom upon arrival.  To edge of bed with rail and min guard.  She is able to lift her leg without assist to get out of bone foam and get LE off EOB.  Stood with min guard/assist for balance.  She initially had some buckling of RLE but was able to control it after a few steps.  To bathroom to void then continued to ambulate 100' in hallway with min assist.  Participated in exercises as described below. Overall progressing well.   Follow Up Recommendations  Home health PT;Supervision for mobility/OOB     Equipment Recommendations  Rolling walker with 5" wheels;3in1 (PT)    Recommendations for Other Services       Precautions / Restrictions Precautions Precautions: Knee;Fall Precaution Booklet Issued: No Restrictions Weight Bearing Restrictions: Yes RLE Weight Bearing: Weight bearing as tolerated    Mobility  Bed Mobility Overal bed mobility: Needs Assistance Bed Mobility: Supine to Sit     Supine to sit: Min guard     General bed mobility comments: safe technique with cues for sequencing  Transfers Overall transfer level: Needs assistance Equipment used: Rolling walker (2 wheeled) Transfers: Sit to/from Stand Sit to Stand: Min guard         General transfer comment: slight unsteadiness with upright posture. RW. Needs min assist for LOB.  Ambulation/Gait Ambulation/Gait assistance: Min guard;Min assist Gait Distance (Feet): 100 Feet Assistive device: Rolling walker (2 wheeled) Gait Pattern/deviations: Step-through pattern;Step-to pattern;Decreased stance time - right Gait velocity: decreased       Stairs             Wheelchair Mobility    Modified Rankin (Stroke Patients Only)       Balance Overall balance assessment: Needs  assistance Sitting-balance support: No upper extremity supported;Feet supported Sitting balance-Leahy Scale: Good     Standing balance support: Bilateral upper extremity supported Standing balance-Leahy Scale: Fair                              Cognition Arousal/Alertness: Awake/alert Behavior During Therapy: WFL for tasks assessed/performed Overall Cognitive Status: Within Functional Limits for tasks assessed                                        Exercises Total Joint Exercises Ankle Circles/Pumps: AROM;10 reps;Supine Long Arc Quad: AAROM;Right;10 reps;Seated Knee Flexion: AAROM;Right;Seated;5 reps Goniometric ROM: 0-90 Other Exercises Other Exercises: to commode to void    General Comments        Pertinent Vitals/Pain Pain Assessment: 0-10 Pain Score: 4  Pain Location: R knee Pain Descriptors / Indicators: Sore Pain Intervention(s): Monitored during session;Repositioned;Ice applied    Home Living                      Prior Function            PT Goals (current goals can now be found in the care plan section) Progress towards PT goals: Progressing toward goals    Frequency    BID      PT Plan Current plan remains appropriate    Co-evaluation  AM-PAC PT "6 Clicks" Mobility   Outcome Measure  Help needed turning from your back to your side while in a flat bed without using bedrails?: None Help needed moving from lying on your back to sitting on the side of a flat bed without using bedrails?: A Little Help needed moving to and from a bed to a chair (including a wheelchair)?: A Little Help needed standing up from a chair using your arms (e.g., wheelchair or bedside chair)?: A Little Help needed to walk in hospital room?: A Little Help needed climbing 3-5 steps with a railing? : A Lot 6 Click Score: 18    End of Session Equipment Utilized During Treatment: Gait belt Activity Tolerance: Patient  tolerated treatment well Patient left: in chair;with chair alarm set;with call bell/phone within reach   Pain - Right/Left: Right Pain - part of body: Knee     Time: 0842-0900 PT Time Calculation (min) (ACUTE ONLY): 18 min  Charges:  $Gait Training: 8-22 mins                    Danielle Dess, PTA 07/03/18, 9:22 AM

## 2018-07-03 NOTE — Anesthesia Postprocedure Evaluation (Signed)
Anesthesia Post Note  Patient: BEAULAH ROMANEK  Procedure(s) Performed: COMPUTER ASSISTED TOTAL KNEE ARTHROPLASTY RIGHT (Right Knee)  Patient location during evaluation: Nursing Unit Anesthesia Type: Spinal Level of consciousness: awake, awake and alert and oriented Pain management: pain level controlled Vital Signs Assessment: post-procedure vital signs reviewed and stable Respiratory status: spontaneous breathing, nonlabored ventilation and respiratory function stable Cardiovascular status: blood pressure returned to baseline and stable Postop Assessment: no headache and no backache Anesthetic complications: no     Last Vitals:  Vitals:   07/02/18 2324 07/03/18 0426  BP: (!) 96/50 119/65  Pulse: (!) 52 (!) 52  Resp: 16 15  Temp: 36.6 C (!) 36.4 C  SpO2: 95% 98%    Last Pain:  Vitals:   07/03/18 0426  TempSrc: Oral  PainSc:                  Johnna Acosta

## 2018-07-04 MED ORDER — TRAMADOL HCL 50 MG PO TABS: 50.0000 mg | ORAL_TABLET | ORAL | 1 refills | Status: DC | PRN

## 2018-07-04 MED ORDER — ENOXAPARIN SODIUM 40 MG/0.4ML ~~LOC~~ SOLN: 40.0000 mg | SUBCUTANEOUS | 0 refills | Status: DC

## 2018-07-04 MED ORDER — CELECOXIB 200 MG PO CAPS: 200.0000 mg | ORAL_CAPSULE | Freq: Two times a day (BID) | ORAL | 1 refills | Status: DC

## 2018-07-04 MED ORDER — OXYCODONE HCL 5 MG PO TABS: 5.0000 mg | ORAL_TABLET | ORAL | 0 refills | Status: DC | PRN

## 2018-07-04 NOTE — Progress Notes (Signed)
Occupational Therapy Treatment Patient Details Name: Alyssa Mcclure MRN: [id number] DOB: 03-23-1950 Today's Date: 07/04/2018    History of present illness Pt admitted for R TKR.   OT comments  PT seen for OT tx session this am in anticipation of discharge this afternoon. Pt denied having reviewed handout provided on instruction from previous session. OT reviewed instruction in falls prevention, compression stocking mgt, polar care mgt, pet care considerations (niece has 3 dogs), AE/DME for ADL tasks, and home/routines modifications. Pt verbalized understanding. Pt progressing, continues to benefit from skilled OT services to maximize return to PLOF.   Follow Up Recommendations  No OT follow up    Equipment Recommendations  3 in 1 bedside commode    Recommendations for Other Services      Precautions / Restrictions Precautions Precautions: Knee;Fall Precaution Booklet Issued: No Restrictions Weight Bearing Restrictions: Yes RLE Weight Bearing: Weight bearing as tolerated       Mobility Bed Mobility                Transfers              Balance                             ADL either performed or assessed with clinical judgement   ADL Overall ADL's : Needs assistance/impaired                                       General ADL Comments: CGA for toilet transfers, PRN Min A for LB dressing and bathing, Max A for compression stocking mgt     Vision Patient Visual Report: No change from baseline     Perception     Praxis      Cognition Arousal/Alertness: Awake/alert Behavior During Therapy: WFL for tasks assessed/performed Overall Cognitive Status: Within Functional Limits for tasks assessed                                          Exercises Other Exercises Other Exercises: pt denied having reviewed handout provided on instruction from previous session. OT reviewed instruction in falls prevention,  compression stocking mgt, polar care mgt, pet care considerations (niece has 3 dogs), AE/DME for ADL tasks, and home/routines modifications   Shoulder Instructions       General Comments      Pertinent Vitals/ Pain       Pain Assessment: No/denies pain Faces Pain Scale: Hurts little more Pain Location: R knee Pain Descriptors / Indicators: Sore;Discomfort Pain Intervention(s): Monitored during session;Patient requesting pain meds-RN notified  Home Living                                          Prior Functioning/Environment              Frequency  Min 1X/week        Progress Toward Goals  OT Goals(current goals can now be found in the care plan section)  Progress towards OT goals: Progressing toward goals  Acute Rehab OT Goals Patient Stated Goal: to get stronger and return to PLOF OT Goal Formulation: With patient Time For Goal Achievement:  07/17/18 Potential to Achieve Goals: Good  Plan Discharge plan remains appropriate;Frequency remains appropriate    Co-evaluation                 AM-PAC OT "6 Clicks" Daily Activity     Outcome Measure   Help from another person eating meals?: None Help from another person taking care of personal grooming?: None Help from another person toileting, which includes using toliet, bedpan, or urinal?: A Little Help from another person bathing (including washing, rinsing, drying)?: A Little Help from another person to put on and taking off regular upper body clothing?: None Help from another person to put on and taking off regular lower body clothing?: A Little 6 Click Score: 21    End of Session    OT Visit Diagnosis: Other abnormalities of gait and mobility (R26.89);Pain Pain - Right/Left: Right Pain - part of body: Hip   Activity Tolerance Patient tolerated treatment well   Patient Left in bed;with call bell/phone within reach;with bed alarm set   Nurse Communication          Time:  1610-9604 OT Time Calculation (min): 10 min  Charges: OT General Charges $OT Visit: 1 Visit OT Treatments $Self Care/Home Management : 8-22 mins  Richrd Prime, MPH, MS, OTR/L ascom [phone] 07/04/18, 9:50 AM

## 2018-07-04 NOTE — Discharge Summary (Signed)
Physician Discharge Summary  Subjective: 2 Days Post-Op Procedure(s) (LRB): COMPUTER ASSISTED TOTAL KNEE ARTHROPLASTY RIGHT (Right) Patient reports pain as mild.   Patient seen in rounds with Dr. Ernest Pine. Patient is well, and has had no acute complaints or problems Patient is ready to go home with home health physical therapy.  Physician Discharge Summary  Patient ID: Alyssa Mcclure MRN: [id number] DOB/AGE: [birth year] 68 y.o.  Admit date: 07/02/2018 Discharge date: 07/04/2018  Admission Diagnoses:  Discharge Diagnoses:  Active Problems:   Total knee replacement status   Discharged Condition: good  Hospital Course: Patient is postop day 2 from a right total knee replacement.  She has done very well since surgery.  Her vitals have remained stable.  She has not had a bowel movement yet but will have one before discharge.  She ambulated 100 feet with physical therapy.  Treatments: surgery:   Right total knee arthroplasty using computer-assisted navigation  SURGEON:  Jena Gauss. M.D.  ANESTHESIA: spinal  ESTIMATED BLOOD LOSS: 50 mL  FLUIDS REPLACED: 1400 mL of crystalloid  TOURNIQUET TIME: 94 minutes  DRAINS: 2 medium Hemovac drains  SOFT TISSUE RELEASES: Anterior cruciate ligament, posterior cruciate ligament, deep medial collateral ligament, patellofemoral ligament  IMPLANTS UTILIZED: DePuy Attune size 4 posterior stabilized femoral component (cemented), size 5 rotating platform tibial component (cemented), 35 mm medialized dome patella (cemented), and a 7 mm stabilized rotating platform polyethylene insert.  Discharge Exam: Blood pressure 112/73, pulse 67, temperature (!) 97.5 F (36.4 C), temperature source Oral, resp. rate 19, height 5\' 5"  (1.651 m), weight 82.1 kg, SpO2 100 %.   Disposition: Discharge disposition: 01-Home or Self Care        Allergies as of 07/04/2018      Reactions   Penicillins Other (See Comments), Hives   Other  reaction(s): Other (See Comments) Has patient had a PCN reaction causing immediate rash, facial/tongue/throat swelling, SOB or lightheadedness with hypotension: No Has patient had a PCN reaction causing severe rash involving mucus membranes or skin necrosis: No Has patient had a PCN reaction that required hospitalization No Has patient had a PCN reaction occurring within the last 10 years: No If all of the above answers are "NO", then may proceed with Cephalosporin use. blotches   Adhesive [tape] Itching   Some bandaids   Losartan Cough   Patient states still on Losartan miscategorized   Statins Other (See Comments)   Leg cramps      Medication List    TAKE these medications   aspirin EC 81 MG tablet Take 81 mg by mouth daily.   celecoxib 200 MG capsule Commonly known as: CELEBREX Take 1 capsule (200 mg total) by mouth 2 (two) times daily.   DULoxetine 60 MG capsule Commonly known as: CYMBALTA Take 60 mg by mouth daily.   enoxaparin 40 MG/0.4ML injection Commonly known as: LOVENOX Inject 0.4 mLs (40 mg total) into the skin daily for 14 doses.   hydrochlorothiazide 25 MG tablet Commonly known as: HYDRODIURIL Take 25 mg by mouth daily.   losartan 100 MG tablet Commonly known as: COZAAR Take 100 mg by mouth daily.   oxyCODONE 5 MG immediate release tablet Commonly known as: Oxy IR/ROXICODONE Take 1 tablet (5 mg total) by mouth every 4 (four) hours as needed for moderate pain (pain score 4-6).   pantoprazole 40 MG tablet Commonly known as: PROTONIX Take 40 mg by mouth 2 (two) times daily.   traMADol 50 MG tablet Commonly known as: ULTRAM Take  1-2 tablets (50-100 mg total) by mouth every 4 (four) hours as needed for moderate pain.   traZODone 50 MG tablet Commonly known as: DESYREL Take 50 mg by mouth at bedtime as needed for sleep.            Durable Medical Equipment  (From admission, onward)         Start     Ordered   07/02/18 1233  DME Walker  rolling  Once    Question:  Patient needs a walker to treat with the following condition  Answer:  Total knee replacement status   07/02/18 1234   07/02/18 1233  DME Bedside commode  Once    Question:  Patient needs a bedside commode to treat with the following condition  Answer:  Total knee replacement status   07/02/18 1234         Follow-up Information    Anson [place], PA-C On 07/16/2018.   Specialty: Physician Assistant Why: at 10:45am Contact information: [address] [place] [address] [phone]        Donato Heinz, MD On 08/14/2018.   Specialty: Orthopedic Surgery Why: at 9:00am Contact information: [address] [facility] [place] [address] [phone]           Signed: Lenard Forth, Domnique Vanegas 07/04/2018, 6:33 AM   Objective: Vital signs in last 24 hours: Temp:  [97.5 F (36.4 C)-98 F (36.7 C)] 97.5 F (36.4 C) (06/26 0017) Pulse Rate:  [67-68] 67 (06/26 0017) Resp:  [17-19] 19 (06/26 0017) BP: (111-122)/(48-73) 112/73 (06/26 0017) SpO2:  [98 %-100 %] 100 % (06/26 0017)  Intake/Output from previous day:  Intake/Output Summary (Last 24 hours) at 07/04/2018 0633 Last data filed at 07/04/2018 0306 Gross per 24 hour  Intake 1356.21 ml  Output 140 ml  Net 1216.21 ml    Intake/Output this shift: Total I/O In: -  Out: 140 [Drains:140]  Labs: No results for input(s): HGB in the last 72 hours. No results for input(s): WBC, RBC, HCT, PLT in the last 72 hours. No results for input(s): NA, K, CL, CO2, BUN, CREATININE, GLUCOSE, CALCIUM in the last 72 hours. No results for input(s): LABPT, INR in the last 72 hours.  EXAM: General - Patient is Alert and Oriented Extremity - Sensation intact distally Intact pulses distally Compartment soft Incision - clean, dry, with the Hemovac removed.  The Hemovac tubing appeared to be intact on removal. Motor Function -plantarflexion and dorsiflexion intact.   Able to straight leg raise independently.  Assessment/Plan: 2 Days Post-Op Procedure(s) (LRB): COMPUTER ASSISTED TOTAL KNEE ARTHROPLASTY RIGHT (Right) Procedure(s) (LRB): COMPUTER ASSISTED TOTAL KNEE ARTHROPLASTY RIGHT (Right) Past Medical History:  Diagnosis Date  . Arthritis   . Depression   . GERD (gastroesophageal reflux disease)   . H/O Bell's palsy   . Heart murmur   . HLD (hyperlipidemia)   . HOH (hard of hearing)   . Hypertension   . Pre-diabetes    Active Problems:   Total knee replacement status  Estimated body mass index is 30.12 kg/m as calculated from the following:   Height as of this encounter: 5\' 5"  (1.651 m).   Weight as of this encounter: 82.1 kg. Advance diet Up with therapy D/C IV fluids Discharge home with home health Diet - Regular diet Follow up - in 2 weeks Activity - WBAT Disposition - Home Condition Upon Discharge - Stable DVT Prophylaxis -Lovenox and Ted support hose  Dedra Skeens, PA-C  Orthopaedic Surgery 07/04/2018, 6:33 AM

## 2018-07-04 NOTE — TOC Transition Note (Signed)
Transition of Care [facility]) - CM/SW Discharge Note   Patient Details  Name: Alyssa Mcclure MRN: [id number] Date of Birth: January 18, 1950  Transition of Care [facility]) CM/SW Contact:  Jeanette Rauth, Lenice Llamas Phone Number: [phone]  07/04/2018, 8:27 AM   Clinical Narrative: Clinical Social Worker (CSW) notified Helene Kelp [facility] home health representative that patient will D/C today. [facility] [employer] DME representative is aware of rolling walker order. Patient has been notified of her Lovenox price $100.54. Please reconsult if future social work needs arise. CSW signing off.      Final next level of care: [place] Barriers to Discharge: Barriers Resolved   Patient Goals and CMS Choice Patient states their goals for this hospitalization and ongoing recovery are:: Improve mobility CMS Medicare.gov Compare Post Acute Care list provided to:: (Patient's surgeon's office arranged home health with [facility] prior to hospital stay.) Choice offered to / list presented to : Patient  Discharge Placement                       Discharge Plan and Services In-house Referral: Clinical Social Work Discharge Planning Services: CM Consult            DME Arranged: Gilford Rile rolling DME Agency: [employer] Date DME Agency Contacted: 07/03/18 Time DME Agency Contacted: [phone] Representative spoke with at DME Agency: [place]: PT [place]: [facility] (formerly [employer]) Date [place]: 07/04/18 Time [place]: 0825 Representative spoke with at [place]: [place] ([place]) Interventions     Readmission Risk Interventions No flowsheet data found.

## 2018-07-04 NOTE — Progress Notes (Signed)
  Subjective: 2 Days Post-Op Procedure(s) (LRB): COMPUTER ASSISTED TOTAL KNEE ARTHROPLASTY RIGHT (Right) Patient reports pain as mild.   Patient seen in rounds with Dr. Marry Guan. Patient is well, and has had no acute complaints or problems Plan is to go Home after hospital stay. Negative for chest pain and shortness of breath Fever: no Gastrointestinal: Negative for nausea and vomiting  Objective: Vital signs in last 24 hours: Temp:  [97.5 F (36.4 C)-98 F (36.7 C)] 97.5 F (36.4 C) (06/26 0017) Pulse Rate:  [67-68] 67 (06/26 0017) Resp:  [17-19] 19 (06/26 0017) BP: (111-122)/(48-73) 112/73 (06/26 0017) SpO2:  [98 %-100 %] 100 % (06/26 0017)  Intake/Output from previous day:  Intake/Output Summary (Last 24 hours) at 07/04/2018 0631 Last data filed at 07/04/2018 0306 Gross per 24 hour  Intake 1356.21 ml  Output 140 ml  Net 1216.21 ml    Intake/Output this shift: Total I/O In: -  Out: 140 [Drains:140]  Labs: No results for input(s): HGB in the last 72 hours. No results for input(s): WBC, RBC, HCT, PLT in the last 72 hours. No results for input(s): NA, K, CL, CO2, BUN, CREATININE, GLUCOSE, CALCIUM in the last 72 hours. No results for input(s): LABPT, INR in the last 72 hours.   EXAM General - Patient is Alert and Oriented Extremity - Neurovascular intact Sensation intact distally Compartment soft Dressing/Incision - clean, dry, with the Hemovac removed.  The Hemovac tubing appeared to be intact on removal. Motor Function - intact, moving foot and toes well on exam.  Able to straight leg raise independently.  The patient ambulated 100 feet.  Past Medical History:  Diagnosis Date  . Arthritis   . Depression   . GERD (gastroesophageal reflux disease)   . H/O Bell's palsy   . Heart murmur   . HLD (hyperlipidemia)   . HOH (hard of hearing)   . Hypertension   . Pre-diabetes     Assessment/Plan: 2 Days Post-Op Procedure(s) (LRB): COMPUTER ASSISTED TOTAL KNEE  ARTHROPLASTY RIGHT (Right) Active Problems:   Total knee replacement status  Estimated body mass index is 30.12 kg/m as calculated from the following:   Height as of this encounter: 5\' 5"  (1.651 m).   Weight as of this encounter: 82.1 kg. Advance diet Up with therapy D/C IV fluids Discharge home with home health planned for today  DVT Prophylaxis - Lovenox, Foot Pumps and TED hose Weight-Bearing as tolerated to right leg  Reche Dixon, PA-C Orthopaedic Surgery 07/04/2018, 6:31 AM

## 2018-07-04 NOTE — Progress Notes (Signed)
Physical Therapy Treatment Patient Details Name: Alyssa Mcclure MRN: [id number] DOB: [birth year] Today's Date: 07/04/2018    History of Present Illness Pt admitted for R TKR.    PT Comments    Pt in chair, ready for session.  Participated in exercises as described below.  Stood and ambulated around unit and completed stair training with walker and min guard.  Progressing well. Education for safety and HEP.  Voiced understanding.  Goals met and no further question or concerns voiced.   Follow Up Recommendations  Home health PT;Supervision for mobility/OOB     Equipment Recommendations  Rolling walker with 5" wheels;3in1 (PT)    Recommendations for Other Services       Precautions / Restrictions Precautions Precautions: Knee;Fall Restrictions Weight Bearing Restrictions: Yes RLE Weight Bearing: Weight bearing as tolerated    Mobility  Bed Mobility               General bed mobility comments: in recliner  Transfers Overall transfer level: Needs assistance Equipment used: Rolling walker (2 wheeled) Transfers: Sit to/from Stand Sit to Stand: Min guard            Ambulation/Gait Ambulation/Gait assistance: Min Emergency planning/management officer (Feet): 200 Feet Assistive device: Rolling walker (2 wheeled) Gait Pattern/deviations: Step-through pattern Gait velocity: decreased       Stairs Stairs: Yes Stairs assistance: Min guard Stair Management: Two rails Number of Stairs: 4 General stair comments: verbal  cues for sequencing but does well overall   Wheelchair Mobility    Modified Rankin (Stroke Patients Only)       Balance Overall balance assessment: Needs assistance Sitting-balance support: Feet supported Sitting balance-Leahy Scale: Good     Standing balance support: Bilateral upper extremity supported Standing balance-Leahy Scale: Good                              Cognition Arousal/Alertness: Awake/alert Behavior During  Therapy: WFL for tasks assessed/performed Overall Cognitive Status: Within Functional Limits for tasks assessed                                        Exercises Total Joint Exercises Ankle Circles/Pumps: AROM;10 reps;Supine Quad Sets: AROM;Supine;10 reps Gluteal Sets: AROM;Supine;10 reps Heel Slides: AAROM;Supine;10 reps Straight Leg Raises: AAROM;Supine;10 reps Long Arc Quad: AAROM;Right;10 reps;Seated Knee Flexion: AAROM;Right;Seated;5 reps Goniometric ROM: 0-95    General Comments        Pertinent Vitals/Pain Pain Assessment: Faces Faces Pain Scale: Hurts little more Pain Location: R knee Pain Descriptors / Indicators: Sore;Discomfort Pain Intervention(s): Monitored during session;Patient requesting pain meds-RN notified    Home Living                      Prior Function            PT Goals (current goals can now be found in the care plan section) Progress towards PT goals: Progressing toward goals    Frequency    BID      PT Plan Current plan remains appropriate    Co-evaluation              AM-PAC PT "6 Clicks" Mobility   Outcome Measure  Help needed turning from your back to your side while in a flat bed without using bedrails?: None Help needed moving from lying on your back  to sitting on the side of a flat bed without using bedrails?: None Help needed moving to and from a bed to a chair (including a wheelchair)?: None Help needed standing up from a chair using your arms (e.g., wheelchair or bedside chair)?: None Help needed to walk in hospital room?: None Help needed climbing 3-5 steps with a railing? : A Little 6 Click Score: 23    End of Session Equipment Utilized During Treatment: Gait belt Activity Tolerance: Patient tolerated treatment well Patient left: in chair;with call bell/phone within reach;with chair alarm set Nurse Communication: Mobility status Pain - Right/Left: Right Pain - part of body: Knee      Time: 1610-9604 PT Time Calculation (min) (ACUTE ONLY): 16 min  Charges:  $Gait Training: 8-22 mins                     Danielle Dess, PTA 07/04/18, 9:37 AM

## 2018-07-04 NOTE — Progress Notes (Signed)
Right leg cleaned, dsg changed per order, applied thigh TED. Discharge summary reviewed with verbal understanding.

## 2018-07-05 DIAGNOSIS — Z471 Aftercare following joint replacement surgery: Secondary | ICD-10-CM | POA: Diagnosis not present

## 2018-07-05 DIAGNOSIS — Z96651 Presence of right artificial knee joint: Secondary | ICD-10-CM | POA: Diagnosis not present

## 2018-07-05 DIAGNOSIS — E785 Hyperlipidemia, unspecified: Secondary | ICD-10-CM | POA: Diagnosis not present

## 2018-07-05 DIAGNOSIS — I10 Essential (primary) hypertension: Secondary | ICD-10-CM | POA: Diagnosis not present

## 2018-07-05 DIAGNOSIS — Z7901 Long term (current) use of anticoagulants: Secondary | ICD-10-CM | POA: Diagnosis not present

## 2018-07-05 DIAGNOSIS — G510 Bell's palsy: Secondary | ICD-10-CM | POA: Diagnosis not present

## 2018-07-05 DIAGNOSIS — K219 Gastro-esophageal reflux disease without esophagitis: Secondary | ICD-10-CM | POA: Diagnosis not present

## 2018-07-05 DIAGNOSIS — F329 Major depressive disorder, single episode, unspecified: Secondary | ICD-10-CM | POA: Diagnosis not present

## 2018-07-07 DIAGNOSIS — F329 Major depressive disorder, single episode, unspecified: Secondary | ICD-10-CM | POA: Diagnosis not present

## 2018-07-07 DIAGNOSIS — G510 Bell's palsy: Secondary | ICD-10-CM | POA: Diagnosis not present

## 2018-07-07 DIAGNOSIS — I10 Essential (primary) hypertension: Secondary | ICD-10-CM | POA: Diagnosis not present

## 2018-07-07 DIAGNOSIS — K219 Gastro-esophageal reflux disease without esophagitis: Secondary | ICD-10-CM | POA: Diagnosis not present

## 2018-07-07 DIAGNOSIS — Z7901 Long term (current) use of anticoagulants: Secondary | ICD-10-CM | POA: Diagnosis not present

## 2018-07-07 DIAGNOSIS — Z96651 Presence of right artificial knee joint: Secondary | ICD-10-CM | POA: Diagnosis not present

## 2018-07-07 DIAGNOSIS — E785 Hyperlipidemia, unspecified: Secondary | ICD-10-CM | POA: Diagnosis not present

## 2018-07-07 DIAGNOSIS — Z471 Aftercare following joint replacement surgery: Secondary | ICD-10-CM | POA: Diagnosis not present

## 2018-07-09 ENCOUNTER — Encounter: Payer: Self-pay | Admitting: Orthopedic Surgery

## 2018-07-09 DIAGNOSIS — I10 Essential (primary) hypertension: Secondary | ICD-10-CM | POA: Diagnosis not present

## 2018-07-09 DIAGNOSIS — F329 Major depressive disorder, single episode, unspecified: Secondary | ICD-10-CM | POA: Diagnosis not present

## 2018-07-09 DIAGNOSIS — K219 Gastro-esophageal reflux disease without esophagitis: Secondary | ICD-10-CM | POA: Diagnosis not present

## 2018-07-09 DIAGNOSIS — E785 Hyperlipidemia, unspecified: Secondary | ICD-10-CM | POA: Diagnosis not present

## 2018-07-09 DIAGNOSIS — Z7901 Long term (current) use of anticoagulants: Secondary | ICD-10-CM | POA: Diagnosis not present

## 2018-07-09 DIAGNOSIS — Z471 Aftercare following joint replacement surgery: Secondary | ICD-10-CM | POA: Diagnosis not present

## 2018-07-09 DIAGNOSIS — Z96651 Presence of right artificial knee joint: Secondary | ICD-10-CM | POA: Diagnosis not present

## 2018-07-09 DIAGNOSIS — G510 Bell's palsy: Secondary | ICD-10-CM | POA: Diagnosis not present

## 2018-07-10 DIAGNOSIS — G510 Bell's palsy: Secondary | ICD-10-CM | POA: Diagnosis not present

## 2018-07-10 DIAGNOSIS — Z96651 Presence of right artificial knee joint: Secondary | ICD-10-CM | POA: Diagnosis not present

## 2018-07-10 DIAGNOSIS — Z7901 Long term (current) use of anticoagulants: Secondary | ICD-10-CM | POA: Diagnosis not present

## 2018-07-10 DIAGNOSIS — K219 Gastro-esophageal reflux disease without esophagitis: Secondary | ICD-10-CM | POA: Diagnosis not present

## 2018-07-10 DIAGNOSIS — F329 Major depressive disorder, single episode, unspecified: Secondary | ICD-10-CM | POA: Diagnosis not present

## 2018-07-10 DIAGNOSIS — E785 Hyperlipidemia, unspecified: Secondary | ICD-10-CM | POA: Diagnosis not present

## 2018-07-10 DIAGNOSIS — Z471 Aftercare following joint replacement surgery: Secondary | ICD-10-CM | POA: Diagnosis not present

## 2018-07-10 DIAGNOSIS — I10 Essential (primary) hypertension: Secondary | ICD-10-CM | POA: Diagnosis not present

## 2018-07-14 DIAGNOSIS — Z7901 Long term (current) use of anticoagulants: Secondary | ICD-10-CM | POA: Diagnosis not present

## 2018-07-14 DIAGNOSIS — E785 Hyperlipidemia, unspecified: Secondary | ICD-10-CM | POA: Diagnosis not present

## 2018-07-14 DIAGNOSIS — Z96651 Presence of right artificial knee joint: Secondary | ICD-10-CM | POA: Diagnosis not present

## 2018-07-14 DIAGNOSIS — Z471 Aftercare following joint replacement surgery: Secondary | ICD-10-CM | POA: Diagnosis not present

## 2018-07-14 DIAGNOSIS — G510 Bell's palsy: Secondary | ICD-10-CM | POA: Diagnosis not present

## 2018-07-14 DIAGNOSIS — I10 Essential (primary) hypertension: Secondary | ICD-10-CM | POA: Diagnosis not present

## 2018-07-14 DIAGNOSIS — F329 Major depressive disorder, single episode, unspecified: Secondary | ICD-10-CM | POA: Diagnosis not present

## 2018-07-14 DIAGNOSIS — K219 Gastro-esophageal reflux disease without esophagitis: Secondary | ICD-10-CM | POA: Diagnosis not present

## 2018-07-16 DIAGNOSIS — Z96651 Presence of right artificial knee joint: Secondary | ICD-10-CM | POA: Diagnosis not present

## 2018-07-16 DIAGNOSIS — M25661 Stiffness of right knee, not elsewhere classified: Secondary | ICD-10-CM | POA: Diagnosis not present

## 2018-07-16 DIAGNOSIS — M25561 Pain in right knee: Secondary | ICD-10-CM | POA: Diagnosis not present

## 2018-07-16 DIAGNOSIS — G8929 Other chronic pain: Secondary | ICD-10-CM | POA: Diagnosis not present

## 2018-07-16 DIAGNOSIS — M6281 Muscle weakness (generalized): Secondary | ICD-10-CM | POA: Diagnosis not present

## 2018-07-18 DIAGNOSIS — M25661 Stiffness of right knee, not elsewhere classified: Secondary | ICD-10-CM | POA: Diagnosis not present

## 2018-07-18 DIAGNOSIS — M6281 Muscle weakness (generalized): Secondary | ICD-10-CM | POA: Diagnosis not present

## 2018-07-18 DIAGNOSIS — M25561 Pain in right knee: Secondary | ICD-10-CM | POA: Diagnosis not present

## 2018-07-18 DIAGNOSIS — Z96651 Presence of right artificial knee joint: Secondary | ICD-10-CM | POA: Diagnosis not present

## 2018-07-18 DIAGNOSIS — G8929 Other chronic pain: Secondary | ICD-10-CM | POA: Diagnosis not present

## 2018-07-22 DIAGNOSIS — G8929 Other chronic pain: Secondary | ICD-10-CM | POA: Diagnosis not present

## 2018-07-22 DIAGNOSIS — Z96651 Presence of right artificial knee joint: Secondary | ICD-10-CM | POA: Diagnosis not present

## 2018-07-22 DIAGNOSIS — M25661 Stiffness of right knee, not elsewhere classified: Secondary | ICD-10-CM | POA: Diagnosis not present

## 2018-07-22 DIAGNOSIS — M6281 Muscle weakness (generalized): Secondary | ICD-10-CM | POA: Diagnosis not present

## 2018-07-22 DIAGNOSIS — M25561 Pain in right knee: Secondary | ICD-10-CM | POA: Diagnosis not present

## 2018-07-25 DIAGNOSIS — Z96651 Presence of right artificial knee joint: Secondary | ICD-10-CM | POA: Diagnosis not present

## 2018-07-30 DIAGNOSIS — Z96651 Presence of right artificial knee joint: Secondary | ICD-10-CM | POA: Diagnosis not present

## 2018-07-30 DIAGNOSIS — M25561 Pain in right knee: Secondary | ICD-10-CM | POA: Diagnosis not present

## 2018-07-30 DIAGNOSIS — M25661 Stiffness of right knee, not elsewhere classified: Secondary | ICD-10-CM | POA: Diagnosis not present

## 2018-07-30 DIAGNOSIS — M6281 Muscle weakness (generalized): Secondary | ICD-10-CM | POA: Diagnosis not present

## 2018-07-30 DIAGNOSIS — G8929 Other chronic pain: Secondary | ICD-10-CM | POA: Diagnosis not present

## 2018-07-30 DIAGNOSIS — K1379 Other lesions of oral mucosa: Secondary | ICD-10-CM | POA: Diagnosis not present

## 2018-08-05 DIAGNOSIS — Z96651 Presence of right artificial knee joint: Secondary | ICD-10-CM | POA: Diagnosis not present

## 2018-08-07 DIAGNOSIS — Z471 Aftercare following joint replacement surgery: Secondary | ICD-10-CM | POA: Diagnosis not present

## 2018-08-11 DIAGNOSIS — M25561 Pain in right knee: Secondary | ICD-10-CM | POA: Diagnosis not present

## 2018-08-11 DIAGNOSIS — Z96651 Presence of right artificial knee joint: Secondary | ICD-10-CM | POA: Diagnosis not present

## 2018-08-11 DIAGNOSIS — G8929 Other chronic pain: Secondary | ICD-10-CM | POA: Diagnosis not present

## 2018-08-11 DIAGNOSIS — M6281 Muscle weakness (generalized): Secondary | ICD-10-CM | POA: Diagnosis not present

## 2018-08-11 DIAGNOSIS — M25661 Stiffness of right knee, not elsewhere classified: Secondary | ICD-10-CM | POA: Diagnosis not present

## 2018-08-14 DIAGNOSIS — M1711 Unilateral primary osteoarthritis, right knee: Secondary | ICD-10-CM | POA: Diagnosis not present

## 2018-08-15 ENCOUNTER — Other Ambulatory Visit: Payer: Self-pay | Admitting: Physician Assistant

## 2018-08-15 DIAGNOSIS — L980 Pyogenic granuloma: Secondary | ICD-10-CM | POA: Diagnosis not present

## 2018-08-15 DIAGNOSIS — L988 Other specified disorders of the skin and subcutaneous tissue: Secondary | ICD-10-CM | POA: Diagnosis not present

## 2018-08-15 DIAGNOSIS — E041 Nontoxic single thyroid nodule: Secondary | ICD-10-CM | POA: Diagnosis not present

## 2018-08-15 DIAGNOSIS — E060 Acute thyroiditis: Secondary | ICD-10-CM | POA: Diagnosis not present

## 2018-08-15 DIAGNOSIS — K120 Recurrent oral aphthae: Secondary | ICD-10-CM | POA: Diagnosis not present

## 2018-08-15 DIAGNOSIS — L449 Papulosquamous disorder, unspecified: Secondary | ICD-10-CM | POA: Diagnosis not present

## 2018-08-18 DIAGNOSIS — Z96651 Presence of right artificial knee joint: Secondary | ICD-10-CM | POA: Diagnosis not present

## 2018-08-18 DIAGNOSIS — M25561 Pain in right knee: Secondary | ICD-10-CM | POA: Diagnosis not present

## 2018-08-21 ENCOUNTER — Other Ambulatory Visit: Payer: Self-pay

## 2018-08-21 ENCOUNTER — Ambulatory Visit
Admission: RE | Admit: 2018-08-21 | Discharge: 2018-08-21 | Disposition: A | Discharge status: Home / Self Care | Payer: Medicare HMO | Source: Ambulatory Visit | Attending: Physician Assistant | Admitting: Physician Assistant

## 2018-08-21 DIAGNOSIS — E041 Nontoxic single thyroid nodule: Secondary | ICD-10-CM | POA: Diagnosis not present

## 2018-09-01 DIAGNOSIS — K1230 Oral mucositis (ulcerative), unspecified: Secondary | ICD-10-CM | POA: Diagnosis not present

## 2018-09-01 DIAGNOSIS — K121 Other forms of stomatitis: Secondary | ICD-10-CM | POA: Diagnosis not present

## 2018-09-02 DIAGNOSIS — L439 Lichen planus, unspecified: Secondary | ICD-10-CM | POA: Diagnosis not present

## 2018-09-24 ENCOUNTER — Ambulatory Visit: Payer: Medicare HMO

## 2018-09-30 DIAGNOSIS — I10 Essential (primary) hypertension: Secondary | ICD-10-CM | POA: Diagnosis not present

## 2018-09-30 DIAGNOSIS — R7303 Prediabetes: Secondary | ICD-10-CM | POA: Diagnosis not present

## 2018-09-30 DIAGNOSIS — E782 Mixed hyperlipidemia: Secondary | ICD-10-CM | POA: Diagnosis not present

## 2018-09-30 DIAGNOSIS — Z1159 Encounter for screening for other viral diseases: Secondary | ICD-10-CM | POA: Diagnosis not present

## 2018-10-07 DIAGNOSIS — I10 Essential (primary) hypertension: Secondary | ICD-10-CM | POA: Diagnosis not present

## 2018-10-07 DIAGNOSIS — E538 Deficiency of other specified B group vitamins: Secondary | ICD-10-CM | POA: Diagnosis not present

## 2018-10-07 DIAGNOSIS — E782 Mixed hyperlipidemia: Secondary | ICD-10-CM | POA: Diagnosis not present

## 2018-10-07 DIAGNOSIS — K1379 Other lesions of oral mucosa: Secondary | ICD-10-CM | POA: Diagnosis not present

## 2018-10-07 DIAGNOSIS — R7303 Prediabetes: Secondary | ICD-10-CM | POA: Diagnosis not present

## 2018-10-07 DIAGNOSIS — D649 Anemia, unspecified: Secondary | ICD-10-CM | POA: Diagnosis not present

## 2018-10-09 ENCOUNTER — Encounter (INDEPENDENT_AMBULATORY_CARE_PROVIDER_SITE_OTHER): Payer: Self-pay

## 2018-10-09 ENCOUNTER — Ambulatory Visit
Admission: RE | Admit: 2018-10-09 | Discharge: 2018-10-09 | Disposition: A | Discharge status: Home / Self Care | Payer: Medicare HMO | Source: Ambulatory Visit | Attending: Physician Assistant | Admitting: Physician Assistant

## 2018-10-09 ENCOUNTER — Other Ambulatory Visit: Payer: Self-pay

## 2018-10-09 DIAGNOSIS — Z1231 Encounter for screening mammogram for malignant neoplasm of breast: Secondary | ICD-10-CM

## 2018-10-14 DIAGNOSIS — K1379 Other lesions of oral mucosa: Secondary | ICD-10-CM | POA: Diagnosis not present

## 2018-10-14 DIAGNOSIS — E538 Deficiency of other specified B group vitamins: Secondary | ICD-10-CM | POA: Diagnosis not present

## 2018-10-14 DIAGNOSIS — R300 Dysuria: Secondary | ICD-10-CM | POA: Diagnosis not present

## 2018-10-17 DIAGNOSIS — Z87891 Personal history of nicotine dependence: Secondary | ICD-10-CM | POA: Diagnosis not present

## 2018-10-17 DIAGNOSIS — K1379 Other lesions of oral mucosa: Secondary | ICD-10-CM | POA: Diagnosis not present

## 2018-10-27 DIAGNOSIS — E538 Deficiency of other specified B group vitamins: Secondary | ICD-10-CM | POA: Diagnosis not present

## 2018-11-03 DIAGNOSIS — E538 Deficiency of other specified B group vitamins: Secondary | ICD-10-CM | POA: Diagnosis not present

## 2018-11-04 ENCOUNTER — Other Ambulatory Visit: Payer: Self-pay | Admitting: Orthopedic Surgery

## 2018-11-04 DIAGNOSIS — M25562 Pain in left knee: Secondary | ICD-10-CM | POA: Diagnosis not present

## 2018-11-04 DIAGNOSIS — R768 Other specified abnormal immunological findings in serum: Secondary | ICD-10-CM | POA: Insufficient documentation

## 2018-11-04 DIAGNOSIS — K1379 Other lesions of oral mucosa: Secondary | ICD-10-CM | POA: Insufficient documentation

## 2018-11-04 DIAGNOSIS — Z114 Encounter for screening for human immunodeficiency virus [HIV]: Secondary | ICD-10-CM | POA: Diagnosis not present

## 2018-11-05 ENCOUNTER — Encounter
Admission: RE | Admit: 2018-11-05 | Discharge: 2018-11-05 | Disposition: A | Discharge status: Home / Self Care | Payer: Medicare HMO | Source: Ambulatory Visit | Attending: Orthopedic Surgery | Admitting: Orthopedic Surgery

## 2018-11-05 ENCOUNTER — Other Ambulatory Visit: Payer: Self-pay

## 2018-11-05 DIAGNOSIS — Z01812 Encounter for preprocedural laboratory examination: Secondary | ICD-10-CM | POA: Diagnosis not present

## 2018-11-05 LAB — SURGICAL PCR SCREEN
MRSA, PCR: NEGATIVE
Staphylococcus aureus: NEGATIVE

## 2018-11-05 LAB — URINALYSIS, ROUTINE W REFLEX MICROSCOPIC
Bilirubin Urine: NEGATIVE
Glucose, UA: NEGATIVE mg/dL
Hgb urine dipstick: NEGATIVE
Ketones, ur: NEGATIVE mg/dL
Leukocytes,Ua: NEGATIVE
Nitrite: NEGATIVE
Protein, ur: NEGATIVE mg/dL
Specific Gravity, Urine: 1.016 (ref 1.005–1.030)
pH: 5 (ref 5.0–8.0)

## 2018-11-05 LAB — TYPE AND SCREEN
ABO/RH(D): O POS
Antibody Screen: NEGATIVE

## 2018-11-05 NOTE — Pre-Procedure Instructions (Signed)
Alyssa Iba, MD  Physician  Cardiology  Progress Notes  Signed  Encounter Date:  06/20/2018          Signed      Expand All Collapse All    Show:Clear all [x] Manual[x] Template[] Copied  Added by: [x] Alyssa Iba, MD  [] Hover for details       Virtual Visit via Video Note   This visit type was conducted due to national recommendations for restrictions regarding the COVID-19 Pandemic (e.g. social distancing) in an effort to limit this patient's exposure and mitigate transmission in our community.  Due to her co-morbid illnesses, this patient is at least at moderate risk for complications without adequate follow up.  This format is felt to be most appropriate for this patient at this time.  All issues noted in this document were discussed and addressed.  A limited physical exam was performed with this format.  Please refer to the patient's chart for her consent to telehealth for [facility]   I connected with Alyssa Mcclure on 06/20/18 by a video enabled telemedicine application and verified that I am speaking with the correct person using two identifiers. I discussed the limitations of evaluation and management by telemedicine. The patient expressed understanding and agreed to proceed.   Evaluation Performed:  Follow-up visit  Date:  06/20/2018   ID:  Alyssa Mcclure, DOB [birth year], MRN [id number]  Patient Location:  [address] [place] [address]   Provider location:   [facility], [place] office  PCP:  [name], MD        Cardiologist:  Hubbard Robinson [facility]   Chief Complaint: Abnormal stress test    History of Present Illness:    Alyssa Mcclure is a 68 y.o. female who presents via audio/video conferencing for a telehealth visit today.   The patient does not symptoms concerning for COVID-19 infection (fever, chills, cough, or new SHORTNESS OF BREATH).   Patient has a past medical history of Hyperlipidemia  Hypertension, Chronic cough GERD Referred by Dr. Dellie Burns for abnormal stress test   Has a heavy chest, at rest in bed especially in the morning "all the time" Had a barium upper GI, "looked good' Denies having any worsening of her mild low-grade chronic chest discomfort with exertion Stays active most of the time, no regular exercise program Doing her errands no escalation of discomfort Does not seem to be positional  Cough "all the time" Sometimes coughs up white phlegm into the back of her throat  "winded " all the time, sedentary lifestyle, sits more If she walks too far or too fast will slow down to recover her breathing then continue on  Excited to have knee replacement surgery in several weeks time with Dr. Ernest Pine  Stress echocardiogram ordered for chronic low-grade chest heaviness  performed at [facility] June 09, 2018 Read by Bethann Punches Exercise for 3 minutes 50 seconds Report indicating mild hypokinesis of the basal inferior wall Ejection fraction greater than 55% Achieved 6.3 METS Target heart rate achieved 160 bpm  Prior CV studies:   The following studies were reviewed today:        Past Medical History:  Diagnosis Date  . Arthritis   . Depression   . GERD (gastroesophageal reflux disease)   . H/O Bell's palsy   . Heart murmur   . Hepatitis   . HLD (hyperlipidemia)   . HOH (hard of hearing)   . Hypertension  Past Surgical History:  Procedure Laterality Date  . ABDOMINAL HYSTERECTOMY    . BILATERAL CARPAL TUNNEL RELEASE Bilateral   . bladder lift  2011  . BROW LIFT Bilateral 06/12/2016   Procedure: BLEPHAROPLASTY UPPER EYELID WITH EXCESS SKIN;  Surgeon: Imagene Riches, MD;  Location: [facility];  Service: Ophthalmology;  Laterality: Bilateral;  . CATARACT EXTRACTION W/PHACO Right 05/09/2015   Procedure: CATARACT EXTRACTION PHACO AND INTRAOCULAR LENS PLACEMENT (IOC);  Surgeon: Sallee Lange, MD;   Location: [facility];  Service: Ophthalmology;  Laterality: Right;  [place] 45.1 AP% 21.0 CDE 19.36 Fluid Pack Lot # [id number]  . CATARACT EXTRACTION W/PHACO Left 03/28/2016   Procedure: CATARACT EXTRACTION PHACO AND INTRAOCULAR LENS PLACEMENT (IOC);  Surgeon: Sallee Lange, MD;  Location: [facility];  Service: Ophthalmology;  Laterality: Left;  [place] 01:05 AP% 21.3 CDE 24.21 fluid pack lot # [id number]  . CESAREAN SECTION    . HERNIA REPAIR    . KNEE ARTHROSCOPY Bilateral   . ROTATOR CUFF REPAIR Bilateral      Active Medications      Current Meds  Medication Sig  . aspirin EC 81 MG tablet Take 81 mg by mouth daily.  . DULoxetine (CYMBALTA) 60 MG capsule Take 60 mg by mouth daily.  . hydrochlorothiazide (HYDRODIURIL) 25 MG tablet Take 25 mg by mouth daily.  Marland Kitchen losartan (COZAAR) 100 MG tablet Take 100 mg by mouth daily.  . pantoprazole (PROTONIX) 40 MG tablet Take 40 mg by mouth 2 (two) times daily.   . traZODone (DESYREL) 50 MG tablet Take 50 mg by mouth at bedtime as needed for sleep.        Allergies:   Penicillins, Adhesive [tape], Losartan, and Statins   Social History        Tobacco Use  . Smoking status: Former Smoker    Types: Cigarettes    Quit date: 2005    Years since quitting: 15.4  . Smokeless tobacco: Never Used  . Tobacco comment: quit 10 + years  Substance Use Topics  . Alcohol use: Yes    Alcohol/week: 1.0 standard drinks    Types: 1 Glasses of wine per week    Comment: occ  . Drug use: No           Current Outpatient Medications on File Prior to Visit  Medication Sig Dispense Refill  . aspirin EC 81 MG tablet Take 81 mg by mouth daily.    . DULoxetine (CYMBALTA) 60 MG capsule Take 60 mg by mouth daily.    . hydrochlorothiazide (HYDRODIURIL) 25 MG tablet Take 25 mg by mouth daily.    Marland Kitchen losartan (COZAAR) 100 MG tablet Take 100 mg by mouth daily.    . pantoprazole (PROTONIX) 40 MG tablet Take 40 mg by mouth 2 (two) times daily.      . traZODone (DESYREL) 50 MG tablet Take 50 mg by mouth at bedtime as needed for sleep.     Marland Kitchen oxyCODONE-acetaminophen (PERCOCET) 5-325 MG tablet Take 1 tablet by mouth every 4 (four) hours as needed for severe pain. (Patient not taking: Reported on 06/20/2018) 6 tablet 0   No current facility-administered medications on file prior to visit.      Family Hx: The patient's family history includes Breast cancer in her cousin and paternal aunt; Breast cancer (age of onset: 65) in her daughter. There is no history of Prostate cancer, Kidney cancer, or Bladder Cancer.  ROS:   Please see the history of present illness.  Review of Systems  Constitutional: Negative.   HENT: Negative.   Respiratory: Negative.   Cardiovascular: Negative.   Gastrointestinal: Negative.   Musculoskeletal: Negative.   Neurological: Negative.   Psychiatric/Behavioral: Negative.   All other systems reviewed and are negative.    Labs/Other Tests and Data Reviewed:    Recent Labs: No results found for requested labs within last 8760 hours.   Recent Lipid Panel Labs (Brief)  No results found for: CHOL, TRIG, HDL, CHOLHDL, LDLCALC, LDLDIRECT       Wt Readings from Last 3 Encounters:  06/20/18 179 lb (81.2 kg)  06/12/16 173 lb (78.5 kg)  04/16/16 178 lb 9.6 oz (81 kg)     Exam:    Vital Signs: Vital signs may also be detailed in the HPI BP 128/77 (BP Location: Left Arm, Patient Position: Sitting, Cuff Size: Normal)   Pulse 96   Ht 5\' 5"  (1.651 m)   Wt 179 lb (81.2 kg)   BMI 29.79 kg/m      Wt Readings from Last 3 Encounters:  06/20/18 179 lb (81.2 kg)  06/12/16 173 lb (78.5 kg)  04/16/16 178 lb 9.6 oz (81 kg)      Temp Readings from Last 3 Encounters:  06/12/16 97.4 F (36.3 C)  03/28/16 97.5 F (36.4 C)  05/09/15 98.5 F (36.9 C)      BP Readings from Last 3 Encounters:  06/20/18 128/77  06/12/16 119/63  04/16/16 125/83      Pulse Readings from Last 3 Encounters:   06/20/18 96  06/12/16 92  04/16/16 90    120 over 70s, pulse 90, respiration 16  Well nourished, well developed female in no acute distress. Constitutional:  oriented to person, place, and time. No distress.  Head: Normocephalic and atraumatic.  Eyes:  no discharge. No scleral icterus.  Neck: Normal range of motion. Neck supple.  Pulmonary/Chest: No audible wheezing, no distress, appears comfortable Musculoskeletal: Normal range of motion.  no  tenderness or deformity.  Neurological:   Coordination normal. Full exam not performed Skin:  No rash Psychiatric:  normal mood and affect. behavior is normal. Thought content normal.    ASSESSMENT & PLAN:    Preop cardiovascular evaluation Details below, no further cardiac testing needed at this time She does not have anginal symptoms, her subtle chest discomfort at rest is atypical.  Stress test EKG read as normal.  . Recommend she proceed with her total knee replacement surgery with Dr. Ernest Pine  Chest heaviness Atypical in nature, typically at rest when in bed sitting up in the morning not with exertion Reports it is chronic all the time not associated with activity Stress test result reviewed, heart rate up to 160 bpm no significant EKG changes Very small region wall motion abnormality documented basal inferior wall This would place her at low risk for upcoming surgery Given no anginal symptoms, subtle findings on stress echocardiogram, no further testing at this time prior to total knee replacement surgery in several weeks time with Dr. Ernest Pine -If she were to develop more anginal symptoms in the future we recommended calcium scoring in [facility] , cardiac CTA could also be performed   Benign essential hypertension Blood pressure is well controlled on today's visit. No changes made to the medications.  Elevated fasting blood sugar We have encouraged continued exercise, careful diet management in an effort to lose weight.    Gastroesophageal reflux disease, esophagitis presence not specified Managed by Dr. Mechele Collin She does have chronic cough, unclear  if this is exacerbated by GERD Suggested she talk with Dr. Mechele Collin, could consider trying alternate PPI  Pure hypercholesterolemia Cholesterol poorly controlled, Discussed this with her in detail Additional work-up for management of her hyperlipidemia includes a CT coronary calcium score   COVID-19 Education: The signs and symptoms of COVID-19 were discussed with the patient and how to seek care for testing (follow up with PCP or arrange E-visit).  The importance of social distancing was discussed today.  Patient Risk:   After full review of this patients clinical status, I feel that they are at least moderate risk at this time.  Time:   Today, I have spent 45 minutes with the patient with telehealth technology discussing the cardiac and medical problems/diagnoses detailed above   10 min spent reviewing the chart prior to patient visit today   Medication Adjustments/Labs and Tests Ordered: Current medicines are reviewed at length with the patient today.  Concerns regarding medicines are outlined above.   Tests Ordered: No tests ordered   Medication Changes: No changes made   Disposition: Follow-up as needed   Signed, Julien Nordmann, MD  06/20/2018 12:05 PM    [facility] [address], [place], [place] [address]            Electronically signed by Alyssa Iba, MD at 06/20/2018 4:30 PM   Telemedicine on 06/20/2018     Detailed Report

## 2018-11-05 NOTE — Pre-Procedure Instructions (Signed)
Echo stress with physician supervision6/01/2018 [facility] Component Name Value Ref Range  LV Ejection Fraction (%) 55   Aortic Valve Regurgitation Grade mild   Aortic Valve Stenosis Grade none   Mitral Valve Regurgitation Grade mild   Mitral Valve Stenosis Grade none   Tricuspid Valve Regurgitation Grade trivial   Tricuspid Valve Regurgitation Max Velocity (m/s) 3.3 m/sec  Result Narrative                                 [place], [place] #: [id number]      [place], [place], [place] [address]   Date: 06/09/2018 10: 95 AM                                Adult  Female  Age: 68 yrs      ECHOCARDIOGRAM REPORT               Outpatient                                KC^^KCWI    STUDY:Stress Echo       TAPE:          MD1: MCLAUGHLIN, MIRIAM KLEM    ECHO:Yes  DOPPLER:Yes    FILE:0000-000-000    BP: 123/77 mmHg    COLOR:Yes  CONTRAST:No   MACHINE:Philips  RV BIOPSY:No     3D:No SOUND QLTY:Moderate      Height: 65 in   MEDIUM:None                       Weight: 180 lb                                BSA: 1.9 m2 _________________________________________________________________________________________        HISTORY: Chest pain         REASON: Assess, LV function       Indication: Chest pressure [R07.89 (ICD-10-CM)] _________________________________________________________________________________________ STRESS ECHOCARDIOGRAPHY         Drugs: None       Target HR: 128 bpm      Maximum Predicted HR: 151 bpm  +----------------------------------------------------------------------------+ :Stage      Duration           HR     BP         : :  RESTING   :              :76    :123/77       : :---------------+----------------------------+----------+---------+     : : EXERCISE   :3:50            :160    :/          : :---------------:----------------------------:----------:---------+     : : RECOVERY   :5:09            :73    :152/91       : :---------------+----------------------------+----------+---------+     : +----------------------------------------------------------------------------+  Stress Duration: 3: 50 mm: ss    Max Stress H.R.: 160 bpm       Target HR Achieved: Yes Maximum workload of 6.30 METS was achieved during exercise _________________________________________________________________________________________ WALL SEGMENT CHANGES             Rest        Stress Anterior Septum Basal: Normal       Hyperkinetic          Mid: Normal       Hyperkinetic         Apical: Normal       Hyperkinetic  Anterior Wall Basal: Normal       Hyperkinetic          Mid: Normal       Hyperkinetic         Apical: Normal       Hyperkinetic   Lateral Wall Basal: Normal       Hyperkinetic          Mid: Normal       Hyperkinetic         Apical: Normal       Hyperkinetic  Posterior Wall Basal: Normal       Hyperkinetic          Mid: Normal       Hyperkinetic  Inferior Wall Basal: Normal       HYPOKINETIC          Mid: Normal       Hyperkinetic         Apical: Normal       Hyperkinetic Inferior Septum Basal: Normal       Hyperkinetic          Mid: Normal        Hyperkinetic       Resting EF: >55% (Est.)         Stress EF: >55% (Est.)  _________________________________________________________________________________________ ADDITIONAL FINDINGS _________________________________________________________________________________________ STRESS ECG RESULTS      ECG Results: Normal _________________________________________________________________________________________ ECHOCARDIOGRAPHIC DESCRIPTIONS LEFT VENTRICLE          Size: Normal      Contraction: Normal       LV Masses: No Masses          LVH: None      Dias.FxClass: Normal RIGHT VENTRICLE          Size: Normal            Free Wall: Normal      Contraction: Normal            RV Masses: No mass PERICARDIUM         Fluid: No effusion _________________________________________________________________________________________  DOPPLER ECHO and OTHER SPECIAL PROCEDURES         Aortic: MILD AR          No AS         Mitral: MILD MR          No MS             MV Inflow E Vel = nm*   MV Annulus E'Vel = nm*             E/E'Ratio = nm*       Tricuspid: TRIVIAL TR         No TS             327.3 cm/sec peak TR vel       Pulmonary: TRIVIAL PR  No PS _________________________________________________________________________________________ ECHOCARDIOGRAPHIC MEASUREMENTS 2D DIMENSIONS AORTA         Values  Normal Range  MAIN PA     Values  Normal Range        Annulus: nm*   [2.1-2.5]       PA Main: nm*    [1.5-2.1]       Aorta Sin: nm*   [2.7-3.3]  RIGHT VENTRICLE      ST Junction: nm*   [2.3-2.9]       RV Base: nm*    [<4.2]       Asc.Aorta: nm*   [2.3-3.1]        RV Mid: nm*    [<3.5] LEFT VENTRICLE                    RV Length: nm*    [<8.6]         LVIDd: nm*   [3.9-5.3]  INFERIOR VENA CAVA         LVIDs: nm*              Max. IVC: nm*    [<=2.1]           FS: nm*   [>25]         Min. IVC: nm*          SWT: nm*   [0.5-0.9]  ------------------          PWT: nm*   [0.5-0.9]  nm* - not measured LEFT ATRIUM        LA Diam: nm*   [2.7-3.8]      LA A4C Area: nm*   [<20]       LA Volume: nm*   [22-52] _________________________________________________________________________________________ INTERPRETATION ABNORMAL STRESS ECHOCARDIOGRAM NORMAL RIGHT VENTRICULAR SYSTOLIC FUNCTION MILD VALVULAR REGURGITATION (See above) NO VALVULAR STENOSIS NOTED Mild stress induced changes likely indicating ischemia _________________________________________________________________________________________ Electronically signed by   Danella Penton, MD on 06/09/2018 06: 25 PM      Performed By: Rayetta Humphrey, RCS   Ordering Physician: Maurine Minister _________________________________________________________________________________________  Other Result Information  Interface, Text Results In - 06/09/2018  6:25 PM EDT                                                              [facility]                                    [phone]           [facility]                           Acct #: [id number]           [address], [place],  [place] [address]      Date: 06/09/2018 10: 25 AM  Adult   Female   Age: 68 yrs           ECHOCARDIOGRAM REPORT                              Outpatient                                                              KC^^KCWI      STUDY:Stress Echo              TAPE:                    MD1: MCLAUGHLIN, MIRIAM KLEM       ECHO:Yes    DOPPLER:Yes       FILE:0000-000-000         BP: 123/77 mmHg      COLOR:Yes   CONTRAST:No     MACHINE:Philips  RV BIOPSY:No          3D:No  SOUND QLTY:Moderate            Height: 65 in     MEDIUM:None                                              Weight: 180 lb                                                              BSA: 1.9 m2 _________________________________________________________________________________________               HISTORY: Chest pain                REASON: Assess, LV function            Indication: Chest pressure [R07.89 (ICD-10-CM)] _________________________________________________________________________________________ STRESS ECHOCARDIOGRAPHY                 Drugs: None             Target HR: 128 bpm           Maximum Predicted HR: 151 bpm +----------------------------------------------------------------------------+ :Stage           Duration                     HR         BP                  : :   RESTING     :                            :76        :123/77              : :---------------+----------------------------+----------+---------+          : :  EXERCISE     :3:50                        :  160       :/                   : :---------------:----------------------------:----------:---------+          : :  RECOVERY     :5:09                        :73        :152/91              : :---------------+----------------------------+----------+---------+          : +----------------------------------------------------------------------------+       Stress Duration: 3: 50 mm: ss       Max Stress H.R.: 160 bpm             Target HR Achieved: Yes Maximum workload of 6.30 METS was achieved during exercise _________________________________________________________________________________________ WALL SEGMENT CHANGES                        Rest                Stress Anterior Septum Basal: Normal              Hyperkinetic                   Mid: Normal              Hyperkinetic                Apical: Normal               Hyperkinetic   Anterior Wall Basal: Normal              Hyperkinetic                   Mid: Normal              Hyperkinetic                Apical: Normal              Hyperkinetic    Lateral Wall Basal: Normal              Hyperkinetic                   Mid: Normal              Hyperkinetic                Apical: Normal              Hyperkinetic  Posterior Wall Basal: Normal              Hyperkinetic                   Mid: Normal              Hyperkinetic   Inferior Wall Basal: Normal              HYPOKINETIC                   Mid: Normal              Hyperkinetic                Apical: Normal              Hyperkinetic Inferior Septum Basal: Normal  Hyperkinetic                   Mid: Normal              Hyperkinetic            Resting EF: >55% (Est.)                  Stress EF: >55% (Est.)  _________________________________________________________________________________________ ADDITIONAL FINDINGS _________________________________________________________________________________________ STRESS ECG RESULTS           ECG Results: Normal _________________________________________________________________________________________ ECHOCARDIOGRAPHIC DESCRIPTIONS LEFT VENTRICLE                  Size: Normal           Contraction: Normal             LV Masses: No Masses                   LVH: None          Dias.FxClass: Normal RIGHT VENTRICLE                  Size: Normal                       Free Wall: Normal           Contraction: Normal                       RV Masses: No mass PERICARDIUM                 Fluid: No effusion _________________________________________________________________________________________  DOPPLER ECHO and OTHER SPECIAL PROCEDURES                Aortic: MILD AR                    No AS                Mitral: MILD MR                    No MS                        MV Inflow E Vel = nm*      MV Annulus E'Vel = nm*                        E/E'Ratio =  nm*             Tricuspid: TRIVIAL TR                 No TS                        327.3 cm/sec peak TR vel             Pulmonary: TRIVIAL PR                 No PS _________________________________________________________________________________________ ECHOCARDIOGRAPHIC MEASUREMENTS 2D DIMENSIONS AORTA                  Values   Normal Range   MAIN PA         Values    Normal Range               Annulus: nm*     [2.1-2.5]  PA Main: nm*       [1.5-2.1]             Aorta Sin: nm*     [2.7-3.3]    RIGHT VENTRICLE           ST Junction: nm*     [2.3-2.9]              RV Base: nm*       [<4.2]             Asc.Aorta: nm*     [2.3-3.1]               RV Mid: nm*       [<3.5] LEFT VENTRICLE                                      RV Length: nm*       [<8.6]                 LVIDd: nm*     [3.9-5.3]    INFERIOR VENA CAVA                 LVIDs: nm*                           Max. IVC: nm*       [<=2.1]                    FS: nm*     [>25]                 Min. IVC: nm*                   SWT: nm*     [0.5-0.9]    ------------------                   PWT: nm*     [0.5-0.9]    nm* - not measured LEFT ATRIUM               LA Diam: nm*     [2.7-3.8]           LA A4C Area: nm*     [<20]             LA Volume: nm*     [22-52] _________________________________________________________________________________________ INTERPRETATION ABNORMAL STRESS ECHOCARDIOGRAM NORMAL RIGHT VENTRICULAR SYSTOLIC FUNCTION MILD VALVULAR REGURGITATION (See above) NO VALVULAR STENOSIS NOTED Mild stress induced changes likely indicating ischemia _________________________________________________________________________________________ Electronically signed by      Danella Penton, MD on 06/09/2018 06: 25 PM          Performed By: Rayetta Humphrey, RCS    Ordering Physician: Maurine Minister _________________________________________________________________________________________  Status Results Details   Encounter  Summary  ECG stress test only

## 2018-11-05 NOTE — Patient Instructions (Signed)
Your procedure is scheduled on: 11-12-18 [facility] Report to Same Day Surgery 2nd floor medical mall [facility] Entrance-take elevator on left to 2nd floor.  Check in with surgery information desk.) To find out your arrival time please call [phone] between 1PM - 3PM on 11-11-18 TUESDAY  Remember: Instructions that are not followed completely may result in serious medical risk, up to and including death, or upon the discretion of your surgeon and anesthesiologist your surgery may need to be rescheduled.    _x___ 1. Do not eat food after midnight the night before your procedure. NO GUM OR CANDY AFTER MIDNIGHT. You may drink clear liquids up to 2 hours before you are scheduled to arrive at the hospital for your procedure.  Do not drink clear liquids within 2 hours of your scheduled arrival to the hospital.  Clear liquids include  --Water or Apple juice without pulp  --Gatorade  --Black Coffee or Clear Tea (No milk, no creamers, do not add anything to the coffee or Tea   ____Ensure clear carbohydrate drink on the way to the hospital for bariatric patients  _X___Ensure clear carbohydrate drink 3 hours before surgery.     __x__ 2. No Alcohol for 24 hours before or after surgery.   __x__3. No Smoking or e-cigarettes for 24 prior to surgery.  Do not use any chewable tobacco products for at least 6 hour prior to surgery   ____  4. Bring all medications with you on the day of surgery if instructed.    __x__ 5. Notify your doctor if there is any change in your medical condition     (cold, fever, infections).    x___6. On the morning of surgery brush your teeth with toothpaste and water.  You may rinse your mouth with mouth wash if you wish.  Do not swallow any toothpaste or mouthwash.   Do not wear jewelry, make-up, hairpins, clips or nail polish.  Do not wear lotions, powders, or perfumes.   Do not shave 48 hours prior to surgery. Men may shave face and neck.  Do not bring valuables  to the hospital.    [facility] is not responsible for any belongings or valuables.               Contacts, dentures or bridgework may not be worn into surgery.  Leave your suitcase in the car. After surgery it may be brought to your room.  For patients admitted to the hospital, discharge time is determined by your  treatment team.  _  Patients discharged the day of surgery will not be allowed to drive home.  You will need someone to drive you home and stay with you the night of your procedure.    Please read over the following fact sheets that you were given:   [facility] Preparing for Surgery and or MRSA Information   _x___ TAKE THE FOLLOWING MEDICATION THE MORNING OF SURGERY WITH A SMALL SIP OF WATER. These include:  1. CYMBALTA (DULOXETINE)  2. PROTONIX (PANTOPRAZOLE)  3. TAKE AN EXTRA PROTONIX THE NIGHT BEFORE YOUR SURGERY  4.  5.  6.  ____Fleets enema or Magnesium Citrate as directed.   _x___ Use CHG Soap or sage wipes as directed on instruction sheet   ____ Use inhalers on the day of surgery and bring to hospital day of surgery  ____ Stop Metformin and Janumet 2 days prior to surgery.    ____ Take 1/2 of usual insulin dose the night before surgery  and none on the morning surgery.   _x___ Follow recommendations from Cardiologist, Pulmonologist or PCP regarding stopping Aspirin, Coumadin, Plavix ,Eliquis, Effient, or Pradaxa, and Pletal-PT STOPPED ASPIRIN 11-04-18 AS INSTRUCTED BY SURGEONS OFFICE  X____Stop Anti-inflammatories such as Advil, Aleve, Ibuprofen, Motrin, Naproxen, Naprosyn, Goodies powders or aspirin products NOW- OK to take Tylenol    ____ Stop supplements until after surgery   ____ Bring C-Pap to the hospital.

## 2018-11-05 NOTE — Pre-Procedure Instructions (Signed)
ECG 12-lead5/26/2020 [place] Component Name Value Ref Range  Vent Rate (bpm) 62   PR Interval (msec) 172   QRS Interval (msec) 74   QT Interval (msec) 392   QTc (msec) 397   Other Result Information  This result has an attachment that is not available.  Result Narrative  Normal sinus rhythm Normal ECG When compared with ECG of 22-Jun-2008 09:14, PREVIOUS ECG IS PRESENT I reviewed and concur with this report. Electronically signed QR:[email or web address] MD, [place] [phone]) on 08/04/2018 5:58:15 PM  Status Results Details   Encounter Summary

## 2018-11-06 LAB — URINE CULTURE
Culture: <10000 — AB
Special Requests: NORMAL

## 2018-11-07 ENCOUNTER — Other Ambulatory Visit: Payer: Self-pay

## 2018-11-07 ENCOUNTER — Other Ambulatory Visit
Admission: RE | Admit: 2018-11-07 | Discharge: 2018-11-07 | Disposition: A | Discharge status: Home / Self Care | Payer: Medicare HMO | Source: Ambulatory Visit | Attending: Orthopedic Surgery | Admitting: Orthopedic Surgery

## 2018-11-07 DIAGNOSIS — Z20828 Contact with and (suspected) exposure to other viral communicable diseases: Secondary | ICD-10-CM | POA: Diagnosis not present

## 2018-11-07 DIAGNOSIS — H02883 Meibomian gland dysfunction of right eye, unspecified eyelid: Secondary | ICD-10-CM | POA: Diagnosis not present

## 2018-11-07 DIAGNOSIS — Z01812 Encounter for preprocedural laboratory examination: Secondary | ICD-10-CM | POA: Insufficient documentation

## 2018-11-07 LAB — COMPREHENSIVE METABOLIC PANEL
ALT: 21 U/L (ref 0–44)
AST: 19 U/L (ref 15–41)
Albumin: 3.7 g/dL (ref 3.5–5.0)
Alkaline Phosphatase: 103 U/L (ref 38–126)
Anion gap: 9 (ref 5–15)
BUN: 18 mg/dL (ref 8–23)
CO2: 26 mmol/L (ref 22–32)
Calcium: 9.1 mg/dL (ref 8.9–10.3)
Chloride: 104 mmol/L (ref 98–111)
Creatinine, Ser: 0.82 mg/dL (ref 0.44–1.00)
GFR calc Af Amer: >60 mL/min (ref >60)
GFR calc non Af Amer: >60 mL/min (ref >60)
Glucose, Bld: 133 mg/dL — ABNORMAL HIGH (ref 70–99)
Potassium: 3.9 mmol/L (ref 3.5–5.1)
Sodium: 139 mmol/L (ref 135–145)
Total Bilirubin: 0.5 mg/dL (ref 0.3–1.2)
Total Protein: 6.6 g/dL (ref 6.5–8.1)

## 2018-11-07 LAB — CBC
HCT: 36.9 % (ref 36.0–46.0)
Hemoglobin: 11.4 g/dL — ABNORMAL LOW (ref 12.0–15.0)
MCH: 26 pg (ref 26.0–34.0)
MCHC: 30.9 g/dL (ref 30.0–36.0)
MCV: 84.1 fL (ref 80.0–100.0)
Platelets: 215 10*3/uL (ref 150–400)
RBC: 4.39 MIL/uL (ref 3.87–5.11)
RDW: 17.8 % — ABNORMAL HIGH (ref 11.5–15.5)
WBC: 6.5 10*3/uL (ref 4.0–10.5)
nRBC: 0 % (ref 0.0–0.2)

## 2018-11-07 LAB — PROTIME-INR
INR: 1 (ref 0.8–1.2)
Prothrombin Time: 12.6 seconds (ref 11.4–15.2)

## 2018-11-07 LAB — SARS CORONAVIRUS 2 (TAT 6-24 HRS): SARS Coronavirus 2: NEGATIVE

## 2018-11-07 LAB — SEDIMENTATION RATE: Sed Rate: 35 mm/hr — ABNORMAL HIGH (ref 0–30)

## 2018-11-07 LAB — APTT: aPTT: 29 seconds (ref 24–36)

## 2018-11-07 LAB — C-REACTIVE PROTEIN: CRP: 1.1 mg/dL — ABNORMAL HIGH (ref <1.0)

## 2018-11-09 NOTE — H&P (Signed)
Signed . Encounter Date: 11/04/2018 . Michelene Gardener, PA  [facility] Chief Complaint:       Chief Complaint  Patient presents with  . Knee Pain    H & P LEFT KNEE    History of Present Illness:    Alyssa Mcclure is a 68 y.o. female that presents to clinic today for her preoperative history and evaluation.  Of note, the patient is also status post right total knee arthroplasty by Dr. Ernest Pine on 07/02/2018.   Patient presents unaccompanied. The patient is scheduled to undergo a left total knee arthroplasty on 11/12/18 by Dr. Ernest Pine. Her pain began several years ago.  The pain is located primarily along the medial aspect of the knee.  She describes her pain as aching.  She reports associated increased pain with weightbearing.  She denies associated numbness or tingling.   The patient's symptoms have progressed to the point that they decrease her quality of life. The patient has previously undergone conservative treatment including NSAIDS and injections to the knee without adequate control of her symptoms.    Past Medical, Surgical, Family, Social History, Allergies, Medications:   Past Medical History:      Past Medical History:  Diagnosis Date  . Allergy   . B12 deficiency    diagnosed November 2013.  . DDD (degenerative disc disease), cervical   . Depression    chronic pain  . Derangement of posterior horn of medial meniscus 06/10/2013  . Essential hypertension, benign 06/10/2013  . GERD (gastroesophageal reflux disease)   . Heart murmur, unspecified   . Hyperlipidemia   . Hypertension   . Impingement syndrome of shoulder, right 02/16/2014  . Obstructive sleep apnea   . Osteoporosis   . Pure hypercholesterolemia 10/20/2013  . Surgical menopause     Past Surgical History:       Past Surgical History:  Procedure Laterality Date  . ABDOMINOPLASTY  2001  . BILATERAL CARPAL TUNNEL SURGERY    . BILATERAL  ELBOW SURGERY    . BILATERAL SHOULDER SURGERY X 2    . CATARACT EXTRACTION Bilateral   . CESAREAN SECTION    . COLONOSCOPY  2006   [facility] [place]): CBF 2016  . COLONOSCOPY  04/19/2014   Int Hemorrhoids: CBF 04/2024  . EGD  04/19/2014   No repeat per RTE  . EGD  03/14/2017   Gastritis: No repeat per RTE  . HERNIA REPAIR  2000  . HYSTERECTOMY  1989   for endometriosis and menorrhagia  . KNEE ARTHROSCOPY  07/29/2009   Left knee arthroscopy, partial medial menisectomy and chondroplasty of the medial, lateral and patellofemoral compartments  . Prolapsed bladder fix  April/May 2011  . REMOVED LUMP FROM RIGHT WRIST VEIN    . RIGHT INGUINAL HERNIA REPAIR  2002  . RIGHT KNEE SURGERY  2006   Arthroscopy  . Right total knee arthroplasty using computer-assisted navigation  07/02/2018   Dr Ernest Pine    Current Medications:        Current Outpatient Medications  Medication Sig Dispense Refill  . digestive enzymes Tab Take 1 tablet by mouth once daily    . multivitamin/iron/folic acid (CENTRUM COMPLETE ORAL) Take 1 tablet by mouth once daily    . aspirin 81 mg tablet 1 tab by mouth daily    . DULoxetine (CYMBALTA) 60 MG DR capsule TAKE 1 CAPSULE EVERY DAY 90 capsule 1  . losartan (COZAAR) 100 MG tablet TAKE 1 TABLET (100  MG TOTAL) BY MOUTH ONCE DAILY 90 tablet 1  . pantoprazole (PROTONIX) 40 MG DR tablet TAKE 1 TABLET TWICE DAILY 180 tablet 1  . traZODone (DESYREL) 50 MG tablet TAKE 1 TABLET EVERY NIGHT AS NEEDED FOR  SLEEP 90 tablet 1            Current Facility-Administered Medications  Medication Dose Route Frequency Provider Last Rate Last Admin  . [START ON 11/21/2018] cyanocobalamin (VITAMIN B12) injection 1,000 mcg  1,000 mcg Intramuscular Q30 Days Maurine MinisterMcLaughlin, Miriam [place], [place]   1,000 mcg at 11/03/18 1104    Allergies:       Allergies  Allergen Reactions  . Penicillins Hives  . Adhesive Rash    Some banaids  . Losartan Cough and Other (See  Comments)    Patient states still on Losartan miscategorized  . Statins-Hmg-Coa Reductase Inhibitors Muscle Pain    Social History:  Social History        Socioeconomic History  . Marital status: Divorced    Spouse name: Not on file  . Number of children: 1  . Years of education: 12+  . Highest education level: High school graduate  Occupational History  . Occupation: Retired  Engineer, productionocial Needs  . Financial resource strain: Not on file  . Food insecurity    Worry: Not on file    Inability: Not on file  . Transportation needs    Medical: Not on file    Non-medical: Not on file  Tobacco Use  . Smoking status: Former Smoker    Packs/day: 1.00    Years: 30.00    Pack years: 30.00    Types: Cigarettes    Start date: 01/09/1968    Quit date: 01/09/2003    Years since quitting: 15.8  . Smokeless tobacco: Never Used  Substance and Sexual Activity  . Alcohol use: Not Currently    Alcohol/week: 1.0 standard drinks    Types: 1 Glasses of wine per week    Frequency: Monthly or less    Comment: Socially  . Drug use: No  . Sexual activity: Never    Partners: Male  Lifestyle  . Physical activity    Days per week: Not on file    Minutes per session: Not on file  . Stress: Not on file  Relationships  . Social Musicianconnections    Talks on phone: Not on file    Gets together: Not on file    Attends religious service: Not on file    Active member of club or organization: Not on file    Attends meetings of clubs or organizations: Not on file    Relationship status: Not on file  Other Topics Concern  . Not on file  Social History Narrative   Lives alone.  Has 70 yo son in [place], has daughter in [place] with breat ca (2017) and niece locally.  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        Tobacco quit 2000, 15 pk yrs.  Occ etoh.      Family History:       Family History  Problem Relation Age of Onset  .  High blood pressure (Hypertension) Mother        X  . Heart disease Mother   . Diabetes Mother   . Arthritis Mother   . Myocardial Infarction (Heart attack) Father   . Arthritis Father   . Epilepsy Other        Siblings  .  Heart disease Paternal Uncle   . Heart disease Paternal Grandfather   . Breast cancer Daughter     Review of Systems:   A 10+ ROS was performed, reviewed, and the pertinent orthopaedic findings are documented in the HPI.    Physical Examination:   BP 130/80   Ht 165.1 cm (5\' 5" )   Wt 81.3 kg (179 lb 3.2 oz)   BMI 29.82 kg/m   Patient is a well-developed, well-nourished female in no acute distress. Patient has normal mood and affect. Patient is alert and oriented to person, place, and time. Pupils are equal and round with synchronous movement. No injected sclera.    Cardiovascular: Regular rate and rhythm, with no murmurs, rubs, or gallops.  Distal pulses palpable.  Respiratory: Lungs clear to auscultation bilaterally.   Left Knee: Soft tissue swelling:mild Effusion:none Erythema:none Crepitance:mild Tenderness:lateral Alignment:relative valgus Mediolateral laxity:lateral pseudolaxity Posterior ELF:[id number] Patellar tracking:Good tracking without evidence of subluxation or tilt Atrophy:No significantatrophy.  Quadriceps tone was fair to good. Range of motion:0/2/119degrees   Tests Performed/Reviewed:  X-rays  Anteroposterior, lateral, and sunrise views of the left knee were obtained.  Images reveal moderate loss of joint space of the medial compartment with significant osteophyte formation.  Subchondral sclerosis of the medial compartment is also noted.  Lateral compartment remains relatively well-preserved with osteophyte  formation noted.  Patellofemoral joint space reveals loss of joint space with osteophyte formation.  No acute fractures noted.  Impression:  Degenerative arthrosis of the left knee  Plan:   The patient has end-stage degenerative changes of the left knee.  It was explained to the patient that the condition is progressive in nature.  Having failed conservative treatment, the patient has elected to proceed with a total joint arthroplasty.  The patient will undergo a total joint arthroplasty with Dr. Marry Guan.  The risks of surgery, including blood clot and infection, were discussed with the patient.  Measures to reduce these risks, including the use of anticoagulation, perioperative antibiotics, and early ambulation were discussed.  The importance of postoperative physical therapy was discussed with the patient. The patient elects to proceed with surgery. The patient is instructed to stop all blood thinners including Aspirin prior to surgery.  The patient is instructed to call the hospital the day before surgery to learn of the proper arrival time.    Contact our office with any questions or concerns.  Follow up as indicated, or sooner should any new problems arise, if conditions worsen, or if they are otherwise concerned.   Gwenlyn Fudge, PA [facility] [address] [place], [place] [address] Phone: [phone]  This note was generated in part with voice recognition software and I apologize for any ypographical errors that were not detected and corrected.

## 2018-11-09 NOTE — Discharge Instructions (Signed)
Instructions after Total Hip Replacement ° ° °  Zamaya Rapaport P. Derotha Fishbaugh, Jr., M.D.    ° Dept. of Orthopaedics & Sports Medicine ° [facility] ° [address] ° [place], [place]  [address] ° Phone: [phone]   Fax: [phone] ° °  °DIET: °• Drink plenty of non-alcoholic fluids. °• Resume your normal diet. Include foods high in fiber. ° °ACTIVITY:  °• You may use crutches or a walker with weight-bearing as tolerated, unless instructed otherwise. °• You may be weaned off of the walker or crutches by your Physical Therapist.  °• Do NOT reach below the level of your knees or cross your legs until allowed.    °• Continue doing gentle exercises. Exercising will reduce the pain and swelling, increase motion, and prevent muscle weakness.   °• Please continue to use the TED compression stockings for 6 weeks. You may remove the stockings at night, but should reapply them in the morning. °• Do not drive or operate any equipment until instructed. ° °WOUND CARE:  °• Continue to use ice packs periodically to reduce pain and swelling. °• Keep the incision clean and dry. °• You may bathe or shower after the staples are removed at the first office visit following surgery. ° °MEDICATIONS: °• You may resume your regular medications. °• Please take the pain medication as prescribed on the medication. °• Do not take pain medication on an empty stomach. °• You have been given a prescription for a blood thinner to prevent blood clots. Please take the medication as instructed. (NOTE: After completing a 2 week course of Lovenox, take one Enteric-coated aspirin once a day.) °• Pain medications and iron supplements can cause constipation. Use a stool softener (Senokot or Colace) on a daily basis and a laxative (dulcolax or miralax) as needed. °• Do not drive or drink alcoholic beverages when taking pain medications. ° °CALL THE OFFICE FOR: °• Temperature above 101 degrees °• Excessive bleeding or drainage on the dressing. °• Excessive  swelling, coldness, or paleness of the toes. °• Persistent nausea and vomiting. ° °FOLLOW-UP:  °• You should have an appointment to return to the office in 6 weeks after surgery. °• Arrangements have been made for continuation of Physical Therapy (either home therapy or outpatient therapy). °  °

## 2018-11-12 ENCOUNTER — Encounter: Payer: Self-pay | Admitting: Orthopedic Surgery

## 2018-11-12 ENCOUNTER — Encounter
Admission: RE | Disposition: A | Discharge status: Home Health | Payer: Self-pay | Source: Home / Self Care | Attending: Orthopedic Surgery

## 2018-11-12 ENCOUNTER — Inpatient Hospital Stay: Payer: Medicare HMO | Admitting: Anesthesiology

## 2018-11-12 ENCOUNTER — Inpatient Hospital Stay: Payer: Medicare HMO

## 2018-11-12 ENCOUNTER — Other Ambulatory Visit: Payer: Self-pay

## 2018-11-12 ENCOUNTER — Inpatient Hospital Stay
Admission: RE | Admit: 2018-11-12 | Discharge: 2018-11-14 | DRG: 470 | Disposition: A | Discharge status: Home Health | Payer: Medicare HMO | Attending: Orthopedic Surgery | Admitting: Orthopedic Surgery

## 2018-11-12 DIAGNOSIS — I10 Essential (primary) hypertension: Secondary | ICD-10-CM | POA: Diagnosis present

## 2018-11-12 DIAGNOSIS — Z91048 Other nonmedicinal substance allergy status: Secondary | ICD-10-CM

## 2018-11-12 DIAGNOSIS — Z79899 Other long term (current) drug therapy: Secondary | ICD-10-CM | POA: Diagnosis not present

## 2018-11-12 DIAGNOSIS — Z888 Allergy status to other drugs, medicaments and biological substances status: Secondary | ICD-10-CM | POA: Diagnosis not present

## 2018-11-12 DIAGNOSIS — E785 Hyperlipidemia, unspecified: Secondary | ICD-10-CM | POA: Diagnosis present

## 2018-11-12 DIAGNOSIS — Z7982 Long term (current) use of aspirin: Secondary | ICD-10-CM

## 2018-11-12 DIAGNOSIS — K219 Gastro-esophageal reflux disease without esophagitis: Secondary | ICD-10-CM | POA: Diagnosis present

## 2018-11-12 DIAGNOSIS — Z8249 Family history of ischemic heart disease and other diseases of the circulatory system: Secondary | ICD-10-CM | POA: Diagnosis not present

## 2018-11-12 DIAGNOSIS — F329 Major depressive disorder, single episode, unspecified: Secondary | ICD-10-CM | POA: Diagnosis not present

## 2018-11-12 DIAGNOSIS — M25762 Osteophyte, left knee: Secondary | ICD-10-CM | POA: Diagnosis not present

## 2018-11-12 DIAGNOSIS — G4733 Obstructive sleep apnea (adult) (pediatric): Secondary | ICD-10-CM | POA: Diagnosis present

## 2018-11-12 DIAGNOSIS — H9190 Unspecified hearing loss, unspecified ear: Secondary | ICD-10-CM | POA: Diagnosis not present

## 2018-11-12 DIAGNOSIS — M1712 Unilateral primary osteoarthritis, left knee: Secondary | ICD-10-CM | POA: Diagnosis not present

## 2018-11-12 DIAGNOSIS — Z833 Family history of diabetes mellitus: Secondary | ICD-10-CM | POA: Diagnosis not present

## 2018-11-12 DIAGNOSIS — Z880 Allergy status to penicillin: Secondary | ICD-10-CM

## 2018-11-12 DIAGNOSIS — Z96651 Presence of right artificial knee joint: Secondary | ICD-10-CM | POA: Diagnosis present

## 2018-11-12 DIAGNOSIS — Z96659 Presence of unspecified artificial knee joint: Secondary | ICD-10-CM

## 2018-11-12 DIAGNOSIS — Z87891 Personal history of nicotine dependence: Secondary | ICD-10-CM

## 2018-11-12 DIAGNOSIS — R7303 Prediabetes: Secondary | ICD-10-CM | POA: Diagnosis not present

## 2018-11-12 DIAGNOSIS — Z96652 Presence of left artificial knee joint: Secondary | ICD-10-CM | POA: Diagnosis not present

## 2018-11-12 DIAGNOSIS — E7800 Pure hypercholesterolemia, unspecified: Secondary | ICD-10-CM | POA: Diagnosis not present

## 2018-11-12 DIAGNOSIS — Z471 Aftercare following joint replacement surgery: Secondary | ICD-10-CM | POA: Diagnosis not present

## 2018-11-12 DIAGNOSIS — Z8261 Family history of arthritis: Secondary | ICD-10-CM

## 2018-11-12 HISTORY — PX: KNEE ARTHROPLASTY: SHX992

## 2018-11-12 SURGERY — ARTHROPLASTY, KNEE, TOTAL, USING IMAGELESS COMPUTER-ASSISTED NAVIGATION
Anesthesia: Spinal | Site: Knee | Laterality: Left

## 2018-11-12 MED ORDER — CLINDAMYCIN PHOSPHATE 600 MG/50ML IV SOLN
INTRAVENOUS | Status: AC
  Filled 2018-11-12: qty 50

## 2018-11-12 MED ORDER — GABAPENTIN 300 MG PO CAPS
300.0000 mg | ORAL_CAPSULE | Freq: Every day | ORAL | Status: DC
  Administered 2018-11-12 – 2018-11-13 (×2): 300 mg via ORAL
  Filled 2018-11-12 (×2): qty 1

## 2018-11-12 MED ORDER — BUPIVACAINE HCL (PF) 0.25 % IJ SOLN
INTRAMUSCULAR | Status: DC | PRN
  Administered 2018-11-12: 30 mL

## 2018-11-12 MED ORDER — PROPOFOL 10 MG/ML IV BOLUS
INTRAVENOUS | Status: AC
  Filled 2018-11-12: qty 20

## 2018-11-12 MED ORDER — GABAPENTIN 300 MG PO CAPS
ORAL_CAPSULE | ORAL | Status: AC
  Filled 2018-11-12: qty 1

## 2018-11-12 MED ORDER — MAGNESIUM HYDROXIDE 400 MG/5ML PO SUSP
30.0000 mL | Freq: Every day | ORAL | Status: DC
  Administered 2018-11-13: 30 mL via ORAL
  Filled 2018-11-12: qty 30

## 2018-11-12 MED ORDER — ACETAMINOPHEN 325 MG PO TABS: 325.0000 mg | ORAL_TABLET | Freq: Four times a day (QID) | ORAL | Status: DC | PRN

## 2018-11-12 MED ORDER — TRANEXAMIC ACID-NACL 1000-0.7 MG/100ML-% IV SOLN
1000.0000 mg | INTRAVENOUS | Status: DC
  Filled 2018-11-12: qty 100

## 2018-11-12 MED ORDER — SODIUM CHLORIDE 0.9 % IV SOLN
INTRAVENOUS | Status: DC
  Administered 2018-11-12 – 2018-11-13 (×2): via INTRAVENOUS

## 2018-11-12 MED ORDER — CELECOXIB 200 MG PO CAPS
ORAL_CAPSULE | ORAL | Status: AC
  Administered 2018-11-12: 400 mg via ORAL
  Filled 2018-11-12: qty 2

## 2018-11-12 MED ORDER — GABAPENTIN 300 MG PO CAPS
300.0000 mg | ORAL_CAPSULE | Freq: Once | ORAL | Status: AC
  Administered 2018-11-12: 08:00:00 300 mg via ORAL

## 2018-11-12 MED ORDER — BUPIVACAINE HCL (PF) 0.25 % IJ SOLN
INTRAMUSCULAR | Status: AC
  Filled 2018-11-12: qty 60

## 2018-11-12 MED ORDER — TRANEXAMIC ACID-NACL 1000-0.7 MG/100ML-% IV SOLN
INTRAVENOUS | Status: DC | PRN
  Administered 2018-11-12: 1000 mg via INTRAVENOUS

## 2018-11-12 MED ORDER — METOCLOPRAMIDE HCL 5 MG/ML IJ SOLN: 5.0000 mg | Freq: Three times a day (TID) | INTRAMUSCULAR | Status: DC | PRN

## 2018-11-12 MED ORDER — CLINDAMYCIN PHOSPHATE 900 MG/50ML IV SOLN: 900.0000 mg | INTRAVENOUS | Status: DC

## 2018-11-12 MED ORDER — LOSARTAN POTASSIUM 50 MG PO TABS
100.0000 mg | ORAL_TABLET | ORAL | Status: DC
  Administered 2018-11-13 – 2018-11-14 (×2): 100 mg via ORAL
  Filled 2018-11-12 (×2): qty 2

## 2018-11-12 MED ORDER — PHENYLEPHRINE HCL (PRESSORS) 10 MG/ML IV SOLN
INTRAVENOUS | Status: DC | PRN
  Administered 2018-11-12 (×2): 50 ug via INTRAVENOUS
  Administered 2018-11-12: 100 ug via INTRAVENOUS

## 2018-11-12 MED ORDER — MENTHOL 3 MG MT LOZG
1.0000 | LOZENGE | OROMUCOSAL | Status: DC | PRN
  Filled 2018-11-12: qty 9

## 2018-11-12 MED ORDER — BUPIVACAINE LIPOSOME 1.3 % IJ SUSP
INTRAMUSCULAR | Status: AC
  Filled 2018-11-12: qty 20

## 2018-11-12 MED ORDER — BISACODYL 10 MG RE SUPP
10.0000 mg | Freq: Every day | RECTAL | Status: DC | PRN
  Administered 2018-11-14: 06:00:00 10 mg via RECTAL
  Filled 2018-11-12: qty 1

## 2018-11-12 MED ORDER — NEOMYCIN-POLYMYXIN B GU 40-200000 IR SOLN
Status: AC
  Filled 2018-11-12: qty 1

## 2018-11-12 MED ORDER — OXYCODONE HCL 5 MG PO TABS
ORAL_TABLET | ORAL | Status: AC
  Filled 2018-11-12: qty 1

## 2018-11-12 MED ORDER — DIGESTIVE ENZYME PO CAPS: 1.0000 | ORAL_CAPSULE | Freq: Every day | ORAL | Status: DC

## 2018-11-12 MED ORDER — ENSURE ENLIVE PO LIQD
296.0000 mL | Freq: Once | ORAL | Status: AC
  Administered 2018-11-12: 296 mL via ORAL

## 2018-11-12 MED ORDER — TRAMADOL HCL 50 MG PO TABS
50.0000 mg | ORAL_TABLET | ORAL | Status: DC | PRN
  Administered 2018-11-12 – 2018-11-14 (×3): 50 mg via ORAL
  Filled 2018-11-12 (×3): qty 1

## 2018-11-12 MED ORDER — GABAPENTIN 400 MG PO CAPS
ORAL_CAPSULE | ORAL | Status: AC
  Administered 2018-11-12: 08:00:00 300 mg via ORAL
  Filled 2018-11-12: qty 1

## 2018-11-12 MED ORDER — METOCLOPRAMIDE HCL 10 MG PO TABS: 5.0000 mg | ORAL_TABLET | Freq: Three times a day (TID) | ORAL | Status: DC | PRN

## 2018-11-12 MED ORDER — MIDAZOLAM HCL 5 MG/5ML IJ SOLN
INTRAMUSCULAR | Status: DC | PRN
  Administered 2018-11-12: 2 mg via INTRAVENOUS

## 2018-11-12 MED ORDER — TRANEXAMIC ACID-NACL 1000-0.7 MG/100ML-% IV SOLN
1000.0000 mg | Freq: Once | INTRAVENOUS | Status: AC
  Administered 2018-11-12: 1000 mg via INTRAVENOUS
  Filled 2018-11-12: qty 100

## 2018-11-12 MED ORDER — ONDANSETRON HCL 4 MG/2ML IJ SOLN: 4.0000 mg | Freq: Once | INTRAMUSCULAR | Status: DC | PRN

## 2018-11-12 MED ORDER — SODIUM CHLORIDE 0.9 % IV SOLN
INTRAVENOUS | Status: DC | PRN
  Administered 2018-11-12: 11:00:00 60 mL

## 2018-11-12 MED ORDER — SODIUM CHLORIDE FLUSH 0.9 % IV SOLN
INTRAVENOUS | Status: AC
  Filled 2018-11-12: qty 40

## 2018-11-12 MED ORDER — PROPOFOL 500 MG/50ML IV EMUL
INTRAVENOUS | Status: AC
  Filled 2018-11-12: qty 50

## 2018-11-12 MED ORDER — NEOMYCIN-POLYMYXIN B GU 40-200000 IR SOLN
Status: DC | PRN
  Administered 2018-11-12: 14 mL

## 2018-11-12 MED ORDER — ONDANSETRON HCL 4 MG/2ML IJ SOLN: 4.0000 mg | Freq: Four times a day (QID) | INTRAMUSCULAR | Status: DC | PRN

## 2018-11-12 MED ORDER — OXYCODONE HCL 5 MG PO TABS
5.0000 mg | ORAL_TABLET | ORAL | Status: DC | PRN
  Administered 2018-11-12 – 2018-11-13 (×2): 5 mg via ORAL
  Filled 2018-11-12: qty 1

## 2018-11-12 MED ORDER — OXYCODONE HCL 5 MG PO TABS: 10.0000 mg | ORAL_TABLET | ORAL | Status: DC | PRN

## 2018-11-12 MED ORDER — PROPOFOL 500 MG/50ML IV EMUL
INTRAVENOUS | Status: DC | PRN
  Administered 2018-11-12: 50 ug/kg/min via INTRAVENOUS

## 2018-11-12 MED ORDER — DULOXETINE HCL 60 MG PO CPEP
60.0000 mg | ORAL_CAPSULE | ORAL | Status: DC
  Administered 2018-11-13 – 2018-11-14 (×2): 60 mg via ORAL
  Filled 2018-11-12 (×2): qty 1

## 2018-11-12 MED ORDER — DEXAMETHASONE SODIUM PHOSPHATE 10 MG/ML IJ SOLN
INTRAMUSCULAR | Status: AC
  Administered 2018-11-12: 8 mg via INTRAVENOUS
  Filled 2018-11-12: qty 1

## 2018-11-12 MED ORDER — CLINDAMYCIN PHOSPHATE 900 MG/50ML IV SOLN
INTRAVENOUS | Status: DC | PRN
  Administered 2018-11-12: 900 mg via INTRAVENOUS

## 2018-11-12 MED ORDER — PANTOPRAZOLE SODIUM 40 MG PO TBEC
40.0000 mg | DELAYED_RELEASE_TABLET | Freq: Two times a day (BID) | ORAL | Status: DC
  Administered 2018-11-12 – 2018-11-14 (×4): 40 mg via ORAL
  Filled 2018-11-12 (×4): qty 1

## 2018-11-12 MED ORDER — CELECOXIB 200 MG PO CAPS
400.0000 mg | ORAL_CAPSULE | Freq: Once | ORAL | Status: AC
  Administered 2018-11-12: 08:00:00 400 mg via ORAL

## 2018-11-12 MED ORDER — CLINDAMYCIN PHOSPHATE 600 MG/50ML IV SOLN
600.0000 mg | Freq: Four times a day (QID) | INTRAVENOUS | Status: AC
  Administered 2018-11-12 – 2018-11-13 (×4): 600 mg via INTRAVENOUS
  Filled 2018-11-12 (×4): qty 50

## 2018-11-12 MED ORDER — SENNOSIDES-DOCUSATE SODIUM 8.6-50 MG PO TABS
1.0000 | ORAL_TABLET | Freq: Two times a day (BID) | ORAL | Status: DC
  Administered 2018-11-12 – 2018-11-13 (×3): 1 via ORAL
  Filled 2018-11-12 (×3): qty 1

## 2018-11-12 MED ORDER — HYDROMORPHONE HCL 1 MG/ML IJ SOLN: 0.5000 mg | INTRAMUSCULAR | Status: DC | PRN

## 2018-11-12 MED ORDER — SODIUM CHLORIDE 0.9 % IV SOLN
INTRAVENOUS | Status: DC | PRN
  Administered 2018-11-12: 25 ug/min via INTRAVENOUS

## 2018-11-12 MED ORDER — FLEET ENEMA 7-19 GM/118ML RE ENEM: 1.0000 | ENEMA | Freq: Once | RECTAL | Status: DC | PRN

## 2018-11-12 MED ORDER — METOCLOPRAMIDE HCL 10 MG PO TABS
10.0000 mg | ORAL_TABLET | Freq: Three times a day (TID) | ORAL | Status: DC
  Administered 2018-11-12 – 2018-11-14 (×7): 10 mg via ORAL
  Filled 2018-11-12 (×7): qty 1

## 2018-11-12 MED ORDER — ALUM & MAG HYDROXIDE-SIMETH 200-200-20 MG/5ML PO SUSP: 30.0000 mL | ORAL | Status: DC | PRN

## 2018-11-12 MED ORDER — LACTATED RINGERS IV SOLN
INTRAVENOUS | Status: DC
  Administered 2018-11-12 (×3): via INTRAVENOUS

## 2018-11-12 MED ORDER — ADULT MULTIVITAMIN W/MINERALS CH
1.0000 | ORAL_TABLET | Freq: Every day | ORAL | Status: DC
  Administered 2018-11-13 – 2018-11-14 (×2): 1 via ORAL
  Filled 2018-11-12 (×3): qty 1

## 2018-11-12 MED ORDER — FENTANYL CITRATE (PF) 100 MCG/2ML IJ SOLN: 25.0000 ug | INTRAMUSCULAR | Status: DC | PRN

## 2018-11-12 MED ORDER — PHENOL 1.4 % MT LIQD
1.0000 | OROMUCOSAL | Status: DC | PRN
  Filled 2018-11-12: qty 177

## 2018-11-12 MED ORDER — ONDANSETRON HCL 4 MG PO TABS: 4.0000 mg | ORAL_TABLET | Freq: Four times a day (QID) | ORAL | Status: DC | PRN

## 2018-11-12 MED ORDER — ENOXAPARIN SODIUM 30 MG/0.3ML ~~LOC~~ SOLN
30.0000 mg | Freq: Two times a day (BID) | SUBCUTANEOUS | Status: DC
  Administered 2018-11-13 – 2018-11-14 (×3): 30 mg via SUBCUTANEOUS
  Filled 2018-11-12 (×3): qty 0.3

## 2018-11-12 MED ORDER — RISAQUAD PO CAPS
1.0000 | ORAL_CAPSULE | Freq: Every day | ORAL | Status: DC
  Administered 2018-11-13 – 2018-11-14 (×2): 1 via ORAL
  Filled 2018-11-12 (×2): qty 1

## 2018-11-12 MED ORDER — DEXAMETHASONE SODIUM PHOSPHATE 10 MG/ML IJ SOLN
8.0000 mg | Freq: Once | INTRAMUSCULAR | Status: AC
  Administered 2018-11-12: 08:00:00 8 mg via INTRAVENOUS

## 2018-11-12 MED ORDER — ACETAMINOPHEN 10 MG/ML IV SOLN
1000.0000 mg | Freq: Four times a day (QID) | INTRAVENOUS | Status: AC
  Administered 2018-11-12 – 2018-11-13 (×4): 1000 mg via INTRAVENOUS
  Filled 2018-11-12 (×4): qty 100

## 2018-11-12 MED ORDER — CHLORHEXIDINE GLUCONATE 4 % EX LIQD
60.0000 mL | Freq: Once | CUTANEOUS | Status: AC
  Administered 2018-11-12: 4 via TOPICAL

## 2018-11-12 MED ORDER — TRAZODONE HCL 50 MG PO TABS: 50.0000 mg | ORAL_TABLET | Freq: Every evening | ORAL | Status: DC | PRN

## 2018-11-12 MED ORDER — DIPHENHYDRAMINE HCL 12.5 MG/5ML PO ELIX: 12.5000 mg | ORAL_SOLUTION | ORAL | Status: DC | PRN

## 2018-11-12 MED ORDER — CLINDAMYCIN PHOSPHATE 900 MG/50ML IV SOLN
INTRAVENOUS | Status: AC
  Filled 2018-11-12: qty 50

## 2018-11-12 MED ORDER — FERROUS SULFATE 325 (65 FE) MG PO TABS
325.0000 mg | ORAL_TABLET | Freq: Two times a day (BID) | ORAL | Status: DC
  Administered 2018-11-12 – 2018-11-14 (×4): 325 mg via ORAL
  Filled 2018-11-12 (×4): qty 1

## 2018-11-12 MED ORDER — CELECOXIB 200 MG PO CAPS
200.0000 mg | ORAL_CAPSULE | Freq: Two times a day (BID) | ORAL | Status: DC
  Administered 2018-11-12 – 2018-11-14 (×4): 200 mg via ORAL
  Filled 2018-11-12 (×4): qty 1

## 2018-11-12 MED ORDER — MIDAZOLAM HCL 2 MG/2ML IJ SOLN
INTRAMUSCULAR | Status: AC
  Filled 2018-11-12: qty 2

## 2018-11-12 SURGICAL SUPPLY — 72 items
ATTUNE PSFEM LTSZ5 NARCEM KNEE (Femur) ×1 IMPLANT
ATTUNE PSRP INSR SZ5 5 KNEE (Insert) ×1 IMPLANT
BASE TIBIAL ROT PLAT SZ 5 KNEE (Knees) IMPLANT
BATTERY INSTRU NAVIGATION (MISCELLANEOUS) ×8 IMPLANT
BLADE SAW 70X12.5 (BLADE) ×2 IMPLANT
BLADE SAW 90X13X1.19 OSCILLAT (BLADE) ×2 IMPLANT
BLADE SAW 90X25X1.19 OSCILLAT (BLADE) ×2 IMPLANT
CANISTER SUCT 3000ML PPV (MISCELLANEOUS) ×2 IMPLANT
CEMENT HV SMART SET (Cement) ×4 IMPLANT
COOLER POLAR GLACIER W/PUMP (MISCELLANEOUS) ×2 IMPLANT
COVER WAND RF STERILE (DRAPES) ×2 IMPLANT
CUFF TOURN SGL QUICK 24 (TOURNIQUET CUFF) ×1
CUFF TOURN SGL QUICK 30 (TOURNIQUET CUFF)
CUFF TRNQT CYL 24X4X16.5-23 (TOURNIQUET CUFF) IMPLANT
CUFF TRNQT CYL 30X4X21-28X (TOURNIQUET CUFF) IMPLANT
DRAPE 3/4 80X56 (DRAPES) ×2 IMPLANT
DRSG DERMACEA 8X12 NADH (GAUZE/BANDAGES/DRESSINGS) ×2 IMPLANT
DRSG OPSITE POSTOP 4X14 (GAUZE/BANDAGES/DRESSINGS) ×2 IMPLANT
DRSG TEGADERM 4X4.75 (GAUZE/BANDAGES/DRESSINGS) ×2 IMPLANT
DURAPREP 26ML APPLICATOR (WOUND CARE) ×4 IMPLANT
ELECT REM PT RETURN 9FT ADLT (ELECTROSURGICAL) ×2
ELECTRODE REM PT RTRN 9FT ADLT (ELECTROSURGICAL) ×1 IMPLANT
EX-PIN ORTHOLOCK NAV 4X150 (PIN) ×4 IMPLANT
GLOVE BIO SURGEON STRL SZ7.5 (GLOVE) ×4 IMPLANT
GLOVE BIOGEL M STRL SZ7.5 (GLOVE) ×4 IMPLANT
GLOVE BIOGEL PI IND STRL 7.5 (GLOVE) ×1 IMPLANT
GLOVE BIOGEL PI INDICATOR 7.5 (GLOVE) ×1
GLOVE INDICATOR 8.0 STRL GRN (GLOVE) ×2 IMPLANT
GOWN STRL REUS W/ TWL LRG LVL3 (GOWN DISPOSABLE) ×2 IMPLANT
GOWN STRL REUS W/ TWL XL LVL3 (GOWN DISPOSABLE) ×1 IMPLANT
GOWN STRL REUS W/TWL LRG LVL3 (GOWN DISPOSABLE) ×2
GOWN STRL REUS W/TWL XL LVL3 (GOWN DISPOSABLE) ×1
HEMOVAC 400CC 10FR (MISCELLANEOUS) ×2 IMPLANT
HOLDER FOLEY CATH W/STRAP (MISCELLANEOUS) ×2 IMPLANT
HOOD PEEL AWAY FLYTE STAYCOOL (MISCELLANEOUS) ×4 IMPLANT
KIT TURNOVER KIT A (KITS) ×2 IMPLANT
KNIFE SCULPS 14X20 (INSTRUMENTS) ×2 IMPLANT
LABEL OR SOLS (LABEL) ×2 IMPLANT
MANIFOLD NEPTUNE II (INSTRUMENTS) ×2 IMPLANT
NDL SAFETY ECLIPSE 18X1.5 (NEEDLE) ×1 IMPLANT
NDL SPNL 20GX3.5 QUINCKE YW (NEEDLE) ×2 IMPLANT
NEEDLE HYPO 18GX1.5 SHARP (NEEDLE) ×1
NEEDLE SPNL 20GX3.5 QUINCKE YW (NEEDLE) ×4 IMPLANT
NS IRRIG 500ML POUR BTL (IV SOLUTION) ×2 IMPLANT
PACK TOTAL KNEE (MISCELLANEOUS) ×2 IMPLANT
PAD WRAPON POLAR KNEE (MISCELLANEOUS) ×1 IMPLANT
PATELLA MEDIAL ATTUN 35MM KNEE (Knees) ×1 IMPLANT
PENCIL SMOKE ULTRAEVAC 22 CON (MISCELLANEOUS) ×2 IMPLANT
PIN DRILL QUICK PACK ×2 IMPLANT
PIN FIXATION 1/8DIA X 3INL (PIN) ×6 IMPLANT
PULSAVAC PLUS IRRIG FAN TIP (DISPOSABLE) ×2
SOL .9 NS 3000ML IRR  AL (IV SOLUTION) ×1
SOL .9 NS 3000ML IRR UROMATIC (IV SOLUTION) ×1 IMPLANT
SOL PREP PVP 2OZ (MISCELLANEOUS) ×2
SOLUTION PREP PVP 2OZ (MISCELLANEOUS) ×1 IMPLANT
SPONGE DRAIN TRACH 4X4 STRL 2S (GAUZE/BANDAGES/DRESSINGS) ×2 IMPLANT
STAPLER SKIN PROX 35W (STAPLE) ×2 IMPLANT
STOCKINETTE IMPERV 14X48 (MISCELLANEOUS) ×1 IMPLANT
STRAP TIBIA SHORT (MISCELLANEOUS) ×2 IMPLANT
SUCTION FRAZIER HANDLE 10FR (MISCELLANEOUS) ×1
SUCTION TUBE FRAZIER 10FR DISP (MISCELLANEOUS) ×1 IMPLANT
SUT VIC AB 0 CT1 36 (SUTURE) ×3 IMPLANT
SUT VIC AB 1 CT1 36 (SUTURE) ×4 IMPLANT
SUT VIC AB 2-0 CT2 27 (SUTURE) ×2 IMPLANT
SYR 20ML LL LF (SYRINGE) ×2 IMPLANT
SYR 30ML LL (SYRINGE) ×4 IMPLANT
TIBIAL BASE ROT PLAT SZ 5 KNEE (Knees) ×2 IMPLANT
TIP FAN IRRIG PULSAVAC PLUS (DISPOSABLE) ×1 IMPLANT
TOWEL OR 17X26 4PK STRL BLUE (TOWEL DISPOSABLE) ×2 IMPLANT
TOWER CARTRIDGE SMART MIX (DISPOSABLE) ×2 IMPLANT
TRAY FOLEY MTR SLVR 16FR STAT (SET/KITS/TRAYS/PACK) ×2 IMPLANT
WRAPON POLAR PAD KNEE (MISCELLANEOUS) ×2

## 2018-11-12 NOTE — Anesthesia Preprocedure Evaluation (Addendum)
Anesthesia Evaluation  Patient identified by MRN, date of birth, ID band Patient awake    Reviewed: Allergy & Precautions, NPO status , Patient's Chart, lab work & pertinent test results  History of Anesthesia Complications Negative for: history of anesthetic complications  Airway Mallampati: III       Dental   Pulmonary sleep apnea (mild, no CPAP) , neg COPD, Not current smoker, former smoker,           Cardiovascular hypertension, Pt. on medications (-) Past MI and (-) CHF (-) dysrhythmias + Valvular Problems/Murmurs (murmur, no tx)      Neuro/Psych neg Seizures Depression    GI/Hepatic Neg liver ROS, GERD  Medicated and Controlled,  Endo/Other  neg diabetes  Renal/GU negative Renal ROS     Musculoskeletal   Abdominal   Peds  Hematology   Anesthesia Other Findings   Reproductive/Obstetrics                            Anesthesia Physical Anesthesia Plan  ASA: III  Anesthesia Plan: Spinal   Post-op Pain Management:    Induction:   PONV Risk Score and Plan:   Airway Management Planned: Nasal Cannula  Additional Equipment:   Intra-op Plan:   Post-operative Plan:   Informed Consent: I have reviewed the patients History and Physical, chart, labs and discussed the procedure including the risks, benefits and alternatives for the proposed anesthesia with the patient or authorized representative who has indicated his/her understanding and acceptance.       Plan Discussed with:   Anesthesia Plan Comments:         Anesthesia Quick Evaluation

## 2018-11-12 NOTE — Progress Notes (Signed)
Ch visited pt in pre-op. Pt shared that she was here for L-knee replacement. Pt had a positive affect and shared that she had a good recovery for her R-knee replacement back in June. Ch allowed space for the pt to express her faith and give a life review. Pt expressed her gratitude and how she is confident that the surgery will go well. Pt has spiritual and health support at home and did not have any concerns about the procedure.  No further needs at this time.    11/12/18 0800  Clinical Encounter Type  Visited With Patient  Visit Type Spiritual support;Social support;Pre-op  Spiritual Encounters  Spiritual Needs Emotional;Grief support  Stress Factors  Patient Stress Factors Health changes  Family Stress Factors None identified

## 2018-11-12 NOTE — Anesthesia Post-op Follow-up Note (Signed)
Anesthesia QCDR form completed.        

## 2018-11-12 NOTE — H&P (Signed)
The patient has been re-examined, and the chart reviewed, and there have been no interval changes to the documented history and physical.    The risks, benefits, and alternatives have been discussed at length. The patient expressed understanding of the risks benefits and agreed with plans for surgical intervention.  James P. Hooten, Jr. M.D.    

## 2018-11-12 NOTE — Progress Notes (Signed)
Pt given and educated on incentive spirometer. Pt demonstrated and reached 153ml inspiration.

## 2018-11-12 NOTE — Op Note (Signed)
OPERATIVE NOTE  DATE OF SURGERY:  11/12/2018  PATIENT NAME:  Alyssa Mcclure   DOB: 1950-04-21  MRN: [id number]  PRE-OPERATIVE DIAGNOSIS: Degenerative arthrosis of the left knee, primary  POST-OPERATIVE DIAGNOSIS:  Same  PROCEDURE:  Left total knee arthroplasty using computer-assisted navigation  SURGEON:  Jena Gauss. M.D.  ASSISTANT: Baldwin Jamaica, PA-C (present and scrubbed throughout the case, critical for assistance with exposure, retraction, instrumentation, and closure)  ANESTHESIA: spinal  ESTIMATED BLOOD LOSS: 50 mL  FLUIDS REPLACED: 1200 mL of crystalloid  TOURNIQUET TIME: 91 minutes  DRAINS: 2 medium Hemovac drains  SOFT TISSUE RELEASES: Anterior cruciate ligament, posterior cruciate ligament, deep medial collateral ligament, patellofemoral ligament  IMPLANTS UTILIZED: DePuy Attune size 5N posterior stabilized femoral component (cemented), size 5 rotating platform tibial component (cemented), 35 mm medialized dome patella (cemented), and a 5 mm stabilized rotating platform polyethylene insert.  INDICATIONS FOR SURGERY: Alyssa Mcclure is a 68 y.o. year old female with a long history of progressive knee pain. X-rays demonstrated severe degenerative changes in tricompartmental fashion. The patient had not seen any significant improvement despite conservative nonsurgical intervention. After discussion of the risks and benefits of surgical intervention, the patient expressed understanding of the risks benefits and agree with plans for total knee arthroplasty.   The risks, benefits, and alternatives were discussed at length including but not limited to the risks of infection, bleeding, nerve injury, stiffness, blood clots, the need for revision surgery, cardiopulmonary complications, among others, and they were willing to proceed.  PROCEDURE IN DETAIL: The patient was brought into the operating room and, after adequate spinal anesthesia was achieved, a tourniquet was placed  on the patient's upper thigh. The patient's knee and leg were cleaned and prepped with alcohol and DuraPrep and draped in the usual sterile fashion. A "timeout" was performed as per usual protocol. The lower extremity was exsanguinated using an Esmarch, and the tourniquet was inflated to 300 mmHg. An anterior longitudinal incision was made followed by a standard mid vastus approach. The deep fibers of the medial collateral ligament were elevated in a subperiosteal fashion off of the medial flare of the tibia so as to maintain a continuous soft tissue sleeve. The patella was subluxed laterally and the patellofemoral ligament was incised. Inspection of the knee demonstrated severe degenerative changes with full-thickness loss of articular cartilage. Osteophytes were debrided using a rongeur. Anterior and posterior cruciate ligaments were excised. Two 4.0 mm Schanz pins were inserted in the femur and into the tibia for attachment of the array of trackers used for computer-assisted navigation. Hip center was identified using a circumduction technique. Distal landmarks were mapped using the computer. The distal femur and proximal tibia were mapped using the computer. The distal femoral cutting guide was positioned using computer-assisted navigation so as to achieve a 5 distal valgus cut. The femur was sized and it was felt that a size 5N femoral component was appropriate. A size 5 femoral cutting guide was positioned and the anterior cut was performed and verified using the computer. This was followed by completion of the posterior and chamfer cuts. Femoral cutting guide for the central box was then positioned in the center box cut was performed.  Attention was then directed to the proximal tibia. Medial and lateral menisci were excised. The extramedullary tibial cutting guide was positioned using computer-assisted navigation so as to achieve a 0 varus-valgus alignment and 3 posterior slope. The cut was performed  and verified using the computer. The proximal tibia was  sized and it was felt that a size 5 tibial tray was appropriate. Tibial and femoral trials were inserted followed by insertion of a 5 mm polyethylene insert. This allowed for excellent mediolateral soft tissue balancing both in flexion and in full extension. Finally, the patella was cut and prepared so as to accommodate a 35 mm medialized dome patella. A patella trial was placed and the knee was placed through a range of motion with excellent patellar tracking appreciated. The femoral trial was removed after debridement of posterior osteophytes. The central post-hole for the tibial component was reamed followed by insertion of a keel punch. Tibial trials were then removed. Cut surfaces of bone were irrigated with copious amounts of normal saline with antibiotic solution using pulsatile lavage and then suctioned dry. Polymethylmethacrylate cement was prepared in the usual fashion using a vacuum mixer. Cement was applied to the cut surface of the proximal tibia as well as along the undersurface of a size 5 rotating platform tibial component. Tibial component was positioned and impacted into place. Excess cement was removed using Personal assistant. Cement was then applied to the cut surfaces of the femur as well as along the posterior flanges of the size 5N femoral component. The femoral component was positioned and impacted into place. Excess cement was removed using Personal assistant. A 5 mm polyethylene trial was inserted and the knee was brought into full extension with steady axial compression applied. Finally, cement was applied to the backside of a 35 mm medialized dome patella and the patellar component was positioned and patellar clamp applied. Excess cement was removed using Personal assistant. After adequate curing of the cement, the tourniquet was deflated after a total tourniquet time of 91 minutes. Hemostasis was achieved using electrocautery. The knee was  irrigated with copious amounts of normal saline with antibiotic solution using pulsatile lavage and then suctioned dry. 20 mL of 1.3% Exparel and 60 mL of 0.25% Marcaine in 40 mL of normal saline was injected along the posterior capsule, medial and lateral gutters, and along the arthrotomy site. A 5 mm stabilized rotating platform polyethylene insert was inserted and the knee was placed through a range of motion with excellent mediolateral soft tissue balancing appreciated and excellent patellar tracking noted. 2 medium drains were placed in the wound bed and brought out through separate stab incisions. The medial parapatellar portion of the incision was reapproximated using interrupted sutures of #1 Vicryl. Subcutaneous tissue was approximated in layers using first #0 Vicryl followed #2-0 Vicryl. The skin was approximated with skin staples. A sterile dressing was applied.  The patient tolerated the procedure well and was transported to the recovery room in stable condition.     P. Angie Fava., M.D.

## 2018-11-12 NOTE — Transfer of Care (Signed)
Immediate Anesthesia Transfer of Care Note  Patient: Alyssa Mcclure  Procedure(s) Performed: COMPUTER ASSISTED TOTAL KNEE ARTHROPLASTY (Left Knee)  Patient Location: PACU  Anesthesia Type:Spinal  Level of Consciousness: awake, alert  and oriented  Airway & Oxygen Therapy: Patient Spontanous Breathing  Post-op Assessment: Report given to RN  Post vital signs: Reviewed and stable  Last Vitals:  Vitals Value Taken Time  BP 108/63 11/12/18 1211  Temp 36 C 11/12/18 1211  Pulse 100 11/12/18 1211  Resp 18 11/12/18 1211  SpO2 100 % 11/12/18 1211  Vitals shown include unvalidated device data.  Last Pain:  Vitals:   11/12/18 0716  TempSrc: Axillary  PainSc: 0-No pain         Complications: No apparent anesthesia complications

## 2018-11-12 NOTE — Anesthesia Procedure Notes (Signed)
Spinal      Patient location during procedure: OR

## 2018-11-12 NOTE — Evaluation (Signed)
Physical Therapy Evaluation Patient Details Name: Alyssa Mcclure MRN: [id number] DOB: [birth year] Today's Date: 11/12/2018   History of Present Illness  Pt admitted for L TKR. History of R TKR back in June 2020.  Clinical Impression  Pt is a pleasant 68 year old female who was admitted for L TKR. Pt performs bed mobility, transfers, and ambulation with cga and RW. Pt demonstrates ability to perform 10 SLRs with independence, therefore does not require KI for mobility. Pt demonstrates deficits with strength/pain/mobility. Pt is very eager to participate. Would benefit from skilled PT to address above deficits and promote optimal return to PLOF. Recommend transition to HHPT upon discharge from acute hospitalization.     Follow Up Recommendations Home health PT    Equipment Recommendations  None recommended by PT    Recommendations for Other Services       Precautions / Restrictions Precautions Precautions: Fall;Knee Precaution Booklet Issued: No Restrictions Weight Bearing Restrictions: Yes LLE Weight Bearing: Weight bearing as tolerated      Mobility  Bed Mobility Overal bed mobility: Needs Assistance Bed Mobility: Supine to Sit     Supine to sit: Min guard     General bed mobility comments: ease of movement. noted. Once seated at EOB, able to sit with upright posture  Transfers Overall transfer level: Needs assistance Equipment used: Rolling walker (2 wheeled) Transfers: Sit to/from Stand Sit to Stand: Min guard         General transfer comment: cues to push from seated surface. Once standing, upright posture noted.   Ambulation/Gait Ambulation/Gait assistance: Min guard Gait Distance (Feet): 5 Feet Assistive device: Rolling walker (2 wheeled) Gait Pattern/deviations: Step-to pattern     General Gait Details: ambulated to recliner safely. Step to gait pattern performed.  Stairs            Wheelchair Mobility    Modified Rankin (Stroke Patients  Only)       Balance Overall balance assessment: Needs assistance Sitting-balance support: Feet supported Sitting balance-Leahy Scale: Good     Standing balance support: Bilateral upper extremity supported Standing balance-Leahy Scale: Good                               Pertinent Vitals/Pain Pain Assessment: 0-10 Pain Score: 5  Pain Location: L knee Pain Descriptors / Indicators: Operative site guarding Pain Intervention(s): Limited activity within patient's tolerance;Ice applied;Premedicated before session    Home Living Family/patient expects to be discharged to:: Private residence Living Arrangements: Alone Available Help at Discharge: Family(niece plans to stay 3-4 days post dc) Type of Home: House Home Access: Stairs to enter Entrance Stairs-Rails: Can reach both Entrance Stairs-Number of Steps: 5 Home Layout: Two level;Able to live on main level with bedroom/bathroom Home Equipment: Gilmer Mor - single point;Shower seat - built in;Adaptive equipment;Walker - 2 wheels      Prior Function Level of Independence: Independent         Comments: recovered well s/p previous TKR.     Hand Dominance        Extremity/Trunk Assessment   Upper Extremity Assessment Upper Extremity Assessment: Overall WFL for tasks assessed    Lower Extremity Assessment Lower Extremity Assessment: Generalized weakness(L LE grossly 3/5)       Communication   Communication: No difficulties  Cognition Arousal/Alertness: Awake/alert Behavior During Therapy: WFL for tasks assessed/performed Overall Cognitive Status: Within Functional Limits for tasks assessed  General Comments      Exercises Total Joint Exercises Goniometric ROM: L knee AAROM:0-69 degrees Other Exercises Other Exercises: Supine ther-ex performed on L LE including AP, quad sets, glut sets, SLR, and hip abd/add. All ther-ex performed x 10 reps with cga    Assessment/Plan    PT Assessment Patient needs continued PT services  PT Problem List Decreased strength;Decreased range of motion;Decreased activity tolerance;Decreased mobility;Pain       PT Treatment Interventions DME instruction;Gait training;Stair training;Therapeutic exercise    PT Goals (Current goals can be found in the Care Plan section)  Acute Rehab PT Goals Patient Stated Goal: to go home PT Goal Formulation: With patient Time For Goal Achievement: 11/26/18 Potential to Achieve Goals: Good    Frequency BID   Barriers to discharge        Co-evaluation               AM-PAC PT "6 Clicks" Mobility  Outcome Measure Help needed turning from your back to your side while in a flat bed without using bedrails?: A Little Help needed moving from lying on your back to sitting on the side of a flat bed without using bedrails?: A Little Help needed moving to and from a bed to a chair (including a wheelchair)?: A Little Help needed standing up from a chair using your arms (e.g., wheelchair or bedside chair)?: A Little Help needed to walk in hospital room?: A Little Help needed climbing 3-5 steps with a railing? : A Lot 6 Click Score: 17    End of Session Equipment Utilized During Treatment: Gait belt Activity Tolerance: Patient tolerated treatment well Patient left: in chair;with chair alarm set;with SCD's reapplied Nurse Communication: Mobility status PT Visit Diagnosis: Difficulty in walking, not elsewhere classified (R26.2);Pain Pain - Right/Left: Left Pain - part of body: Knee    Time: 1610-9604 PT Time Calculation (min) (ACUTE ONLY): 22 min   Charges:   PT Evaluation $PT Eval Low Complexity: 1 Low PT Treatments $Therapeutic Exercise: 8-22 mins        Elizabeth Palau, PT, DPT [phone]   Niyana Chesbro 11/12/2018, 4:49 PM

## 2018-11-12 NOTE — Anesthesia Procedure Notes (Signed)
Spinal  Patient location during procedure: OR Start time: 11/12/2018 8:46 AM End time: 11/12/2018 8:49 AM Staffing Resident/CRNA: Lesle Reek, CRNA Preanesthetic Checklist Completed: patient identified, surgical consent, pre-op evaluation, timeout performed, IV checked and monitors and equipment checked Spinal Block Patient position: sitting Prep: Betadine and ChloraPrep Patient monitoring: heart rate, continuous pulse ox and blood pressure Approach: midline Location: L3-4 Injection technique: single-shot Needle Needle type: Pencan  Needle gauge: 24 G Needle length: 10 cm Needle insertion depth: 5 cm Assessment Sensory level: T6

## 2018-11-13 MED ORDER — CHLORHEXIDINE GLUCONATE CLOTH 2 % EX PADS: 6.0000 | MEDICATED_PAD | Freq: Every day | CUTANEOUS | Status: DC

## 2018-11-13 NOTE — Progress Notes (Signed)
  Subjective: 1 Day Post-Op Procedure(s) (LRB): COMPUTER ASSISTED TOTAL KNEE ARTHROPLASTY (Left) Patient reports pain as well-controlled but worse than she had with her right knee. Patient is well, and has had no acute complaints or problems Plan is to go Home after hospital stay. Negative for chest pain and shortness of breath Fever: no Gastrointestinal: negative for nausea and vomiting.  Patient has not had a bowel movement.  Objective: Vital signs in last 24 hours: Temp:  [96.9 F (36.1 C)-98.6 F (37 C)] 97.8 F (36.6 C) (11/05 0753) Pulse Rate:  [82-105] 82 (11/05 0753) Resp:  [13-19] 16 (11/05 0753) BP: (97-136)/(54-92) 129/81 (11/05 0753) SpO2:  [95 %-100 %] 99 % (11/05 0753)  Intake/Output from previous day:  Intake/Output Summary (Last 24 hours) at 11/13/2018 0820 Last data filed at 11/13/2018 0611 Gross per 24 hour  Intake 3455.2 ml  Output 2400 ml  Net 1055.2 ml    Intake/Output this shift: No intake/output data recorded.  Labs: No results for input(s): HGB in the last 72 hours. No results for input(s): WBC, RBC, HCT, PLT in the last 72 hours. No results for input(s): NA, K, CL, CO2, BUN, CREATININE, GLUCOSE, CALCIUM in the last 72 hours. No results for input(s): LABPT, INR in the last 72 hours.   EXAM General - Patient is Alert, Appropriate and Oriented Extremity - Neurovascular intact Dorsiflexion/Plantar flexion intact Compartment soft Postoperative dressing and Hemovac remain in place.  Polar care in place. Dressing/Incision -postoperative dressing remains in place. Motor Function - intact, moving foot and toes well on exam.  Able to perform straight leg raise. Cardiovascular- Regular rate and rhythm, no murmurs/rubs/gallops Respiratory- Lungs clear to auscultation bilaterally Gastrointestinal- soft, nontender and active bowel sounds   Assessment/Plan: 1 Day Post-Op Procedure(s) (LRB): COMPUTER ASSISTED TOTAL KNEE ARTHROPLASTY (Left) Active  Problems:   Total knee replacement status  Estimated body mass index is 29.79 kg/m as calculated from the following:   Height as of this encounter: 5\' 5"  (1.651 m).   Weight as of this encounter: 81.2 kg. Advance diet Up with therapy Plan for discharge tomorrow    DVT Prophylaxis - Lovenox and foot pumps Weight-Bearing as tolerated to left leg  Cassell Smiles, PA-C [facility] Orthopa[facility] 11/13/2018, 8:20 AM

## 2018-11-13 NOTE — TOC Benefit Eligibility Note (Signed)
Transition of Care [facility]) Benefit Eligibility Note    Patient Details  Name: Alyssa Mcclure MRN: [id number] Date of Birth: May 12, 1950   Medication/Dose: Enoxaparin 40 mg daily x 14 days  Covered?: Yes(Generic - Enoxaparin)  Tier: Other(Tier 4)  Prescription Coverage Preferred Pharmacy: [facility] with Person/Company/Phone Number:: Paralee Cancel, [phone]  Co-Pay: $100 (normal benefit plan is for 30 days) He could not give me a estimate for a 14 day supply but thought it would be less  Prior Approval: No  Deductible: Met  Additional Notes: Lovenox 40 mg not on formulary    [place] Phone Number: 11/13/2018, 10:59 AM

## 2018-11-13 NOTE — Progress Notes (Signed)
Physical Therapy Treatment Patient Details Name: Alyssa Mcclure MRN: [id number] DOB: [birth year] Today's Date: 11/13/2018    History of Present Illness Pt admitted for L TKR. History of R TKR back in June 2020.    PT Comments    Pt is making great progress towards goals with ability to ambulate around RN station easily this afternoon despite being sleepy. Good endurance with there-ex. Reviewed HEP. Will need to perform stair training prior to dc. Will continue to progress.   Follow Up Recommendations  Home health PT     Equipment Recommendations  None recommended by PT    Recommendations for Other Services       Precautions / Restrictions Precautions Precautions: Fall;Knee Precaution Booklet Issued: Yes (comment) Restrictions Weight Bearing Restrictions: Yes LLE Weight Bearing: Weight bearing as tolerated    Mobility  Bed Mobility Overal bed mobility: Needs Assistance Bed Mobility: Supine to Sit     Supine to sit: Supervision Sit to supine: Supervision   General bed mobility comments: safe technique performed with cues for sequencing  Transfers Overall transfer level: Needs assistance Equipment used: Rolling walker (2 wheeled) Transfers: Sit to/from Stand Sit to Stand: Supervision         General transfer comment: cues for pushing from seated surface  Ambulation/Gait Ambulation/Gait assistance: Min guard Gait Distance (Feet): 200 Feet Assistive device: Rolling walker (2 wheeled) Gait Pattern/deviations: Step-through pattern Gait velocity: 10' in 8 seconds   General Gait Details: reciprocal gait pattern with increased speed. Able to carry conversation with exertion. LImited pain noted.   Stairs             Wheelchair Mobility    Modified Rankin (Stroke Patients Only)       Balance Overall balance assessment: Needs assistance Sitting-balance support: Feet supported Sitting balance-Leahy Scale: Good     Standing balance support:  Bilateral upper extremity supported Standing balance-Leahy Scale: Good                              Cognition Arousal/Alertness: Awake/alert Behavior During Therapy: WFL for tasks assessed/performed Overall Cognitive Status: Within Functional Limits for tasks assessed                                        Exercises Other Exercises Other Exercises: Supine ther-ex performed on L LE including AP, quad sets, SAQ, SLR, heel slides, and hip abd/add. All ther-ex performed x 12 reps with cga    General Comments        Pertinent Vitals/Pain Pain Assessment: 0-10 Pain Score: 5  Pain Location: L knee Pain Descriptors / Indicators: Operative site guarding Pain Intervention(s): Limited activity within patient's tolerance    Home Living                      Prior Function            PT Goals (current goals can now be found in the care plan section) Acute Rehab PT Goals Patient Stated Goal: to go home PT Goal Formulation: With patient Time For Goal Achievement: 11/26/18 Potential to Achieve Goals: Good Progress towards PT goals: Progressing toward goals    Frequency    BID      PT Plan Current plan remains appropriate    Co-evaluation  AM-PAC PT "6 Clicks" Mobility   Outcome Measure  Help needed turning from your back to your side while in a flat bed without using bedrails?: A Little Help needed moving from lying on your back to sitting on the side of a flat bed without using bedrails?: A Little Help needed moving to and from a bed to a chair (including a wheelchair)?: A Little Help needed standing up from a chair using your arms (e.g., wheelchair or bedside chair)?: A Little Help needed to walk in hospital room?: A Little Help needed climbing 3-5 steps with a railing? : A Little 6 Click Score: 18    End of Session Equipment Utilized During Treatment: Gait belt Activity Tolerance: Patient tolerated treatment  well Patient left: with SCD's reapplied;in bed;with bed alarm set Nurse Communication: Mobility status PT Visit Diagnosis: Difficulty in walking, not elsewhere classified (R26.2);Pain Pain - Right/Left: Left Pain - part of body: Knee     Time: 5188-4166 PT Time Calculation (min) (ACUTE ONLY): 14 min  Charges:  $Gait Training: 8-22 mins                     Elizabeth Palau, PT, DPT [phone]    Lareina Espino 11/13/2018, 3:31 PM

## 2018-11-13 NOTE — Evaluation (Signed)
Occupational Therapy Evaluation Patient Details Name: Alyssa Mcclure MRN: [id number] DOB: 06/18/1950 Today's Date: 11/13/2018    History of Present Illness Pt admitted for L TKR. History of R TKR back in June 2020.   Clinical Impression   Pt seen for OT evaluation this date, POD#1 from above surgery. Pt was independent in all ADL prior to surgery and reports excellent recovery process from previous R TKR. Pt is eager to return to PLOF with less pain and improved safety and independence. Pt currently requires PRN minimal assist for LB dressing while in seated position due to pain and limited AROM of * knee. Pt instructed in polar care mgt, falls prevention strategies, home/routines modifications, DME/AE for LB bathing and dressing tasks, and compression stocking mgt. Handout provided to support recall and carryover. Pt verbalizes understanding and recall from previous knee surgery. Expresses plan for niece to assist just like last time. Do not currently anticipate any additional skilled OT needs at this time. Will sign off. Please re-consult if additional needs arise.       Follow Up Recommendations  No OT follow up    Equipment Recommendations  None recommended by OT    Recommendations for Other Services       Precautions / Restrictions Precautions Precautions: Fall;Knee Precaution Booklet Issued: Yes (comment) Restrictions Weight Bearing Restrictions: Yes LLE Weight Bearing: Weight bearing as tolerated      Mobility Bed Mobility Overal bed mobility: Needs Assistance Bed Mobility: Supine to Sit;Sit to Supine     Supine to sit: Supervision Sit to supine: Supervision      Transfers Overall transfer level: Needs assistance Equipment used: Rolling walker (2 wheeled) Transfers: Sit to/from Stand Sit to Stand: Min guard              Balance Overall balance assessment: Needs assistance Sitting-balance support: Feet supported Sitting balance-Leahy Scale: Good      Standing balance support: Bilateral upper extremity supported Standing balance-Leahy Scale: Good                             ADL either performed or assessed with clinical judgement   ADL                                         General ADL Comments: PRN Min A for LB ADL, CGA for functional ADL mobility and toilet transfers; min-mod A for compression stockings; pt reports niece is able to provide needed level of assist     Vision Baseline Vision/History: Wears glasses Wears Glasses: At all times Patient Visual Report: No change from baseline       Perception     Praxis      Pertinent Vitals/Pain Pain Assessment: 0-10 Pain Score: 5  Pain Location: L knee Pain Descriptors / Indicators: Operative site guarding Pain Intervention(s): Limited activity within patient's tolerance;Monitored during session;Repositioned;Ice applied;Premedicated before session     Hand Dominance Right   Extremity/Trunk Assessment Upper Extremity Assessment Upper Extremity Assessment: Overall WFL for tasks assessed   Lower Extremity Assessment Lower Extremity Assessment: LLE deficits/detail LLE Deficits / Details: expected post-op strength/ROM deficits       Communication Communication Communication: No difficulties   Cognition Arousal/Alertness: Awake/alert Behavior During Therapy: WFL for tasks assessed/performed Overall Cognitive Status: Within Functional Limits for tasks assessed  General Comments       Exercises Other Exercises Other Exercises: Pt instructed in falls prevention, pet care considerations, AE/DME for ADL, home/routines modifications, polar care mgt, and compression stocking mgt; handout provided to support recall and carryover   Shoulder Instructions      Home Living Family/patient expects to be discharged to:: Private residence Living Arrangements: Alone Available Help at Discharge:  Family(niece plans to stay 3-4 days post dc) Type of Home: House Home Access: Stairs to enter [employer] of Steps: 5 Entrance Stairs-Rails: Can reach both Home Layout: Two level;Able to live on main level with bedroom/bathroom     Bathroom Shower/Tub: Producer, television/film/video: Handicapped height     Home Equipment: Cane - single point;Shower seat - built in;Adaptive equipment;Walker - 2 wheels Adaptive Equipment: Reacher        Prior Functioning/Environment Level of Independence: Independent        Comments: recovered well s/p previous TKR.        OT Problem List: Decreased strength;Pain;Decreased range of motion      OT Treatment/Interventions:      OT Goals(Current goals can be found in the care plan section) Acute Rehab OT Goals Patient Stated Goal: to go home OT Goal Formulation: All assessment and education complete, DC therapy  OT Frequency:     Barriers to D/C:            Co-evaluation              AM-PAC OT "6 Clicks" Daily Activity     Outcome Measure Help from another person eating meals?: None Help from another person taking care of personal grooming?: None Help from another person toileting, which includes using toliet, bedpan, or urinal?: A Little Help from another person bathing (including washing, rinsing, drying)?: A Little Help from another person to put on and taking off regular upper body clothing?: None Help from another person to put on and taking off regular lower body clothing?: A Little 6 Click Score: 21   End of Session Equipment Utilized During Treatment: Gait belt;Rolling walker  Activity Tolerance: Patient tolerated treatment well Patient left: in bed;with call bell/phone within reach;with bed alarm set;with SCD's reapplied;Other (comment)(polar care in place)  OT Visit Diagnosis: Other abnormalities of gait and mobility (R26.89);Pain Pain - Right/Left: Left Pain - part of body: Knee                 Time: 0826-0850 OT Time Calculation (min): 24 min Charges:  OT General Charges $OT Visit: 1 Visit OT Evaluation $OT Eval Low Complexity: 1 Low OT Treatments $Self Care/Home Management : 8-22 mins  Richrd Prime, MPH, MS, OTR/L ascom [phone] 11/13/18, 9:27 AM

## 2018-11-13 NOTE — TOC Initial Note (Signed)
Transition of Care [facility]) - Initial/Assessment Note    Patient Details  Name: Alyssa Mcclure MRN: [id number] Date of Birth: 03-Sep-1950  Transition of Care [facility]) CM/SW Contact:    Barrie Dunker, RN Phone Number: 11/13/2018, 11:47 AM  Clinical Narrative:                 Met with the patient at bedside to discuss DC plan and needs She lives at home alone however her niece will be staying with her and providing transportation She has all the DME she needs at home from a previous surgery in June She would like to use [facility] and would prefer to Have Arline Asp again I notified Dara Hoyer at [facility] Her lovenox was $105 in June and she is prepared for it to be the same, she can afford her medications  She is up to date with her PCP No additional needs at this time  Expected Discharge Plan: Home w Home Health Services Barriers to Discharge: Continued Medical Work up   Patient Goals and CMS Choice Patient states their goals for this hospitalization and ongoing recovery are:: go home CMS Medicare.gov Compare Post Acute Care list provided to:: Patient Choice offered to / list presented to : Patient  Expected Discharge Plan and Services Expected Discharge Plan: Home w Home Health Services   Discharge Planning Services: CM Consult Post Acute Care Choice: Home Health Living arrangements for the past 2 months: Single Family Home                 DME Arranged: N/A         HH Arranged: PT HH Agency: [facility] Date HH Agency Contacted: 11/13/18 Time HH Agency Contacted: 1146 Representative spoke with at [facility] Agency: Dara Hoyer  Prior Living Arrangements/Services Living arrangements for the past 2 months: Single Family Home Lives with:: Self(Niece will be staying with her) Patient language and need for interpreter reviewed:: No Do you feel safe going back to the place where you live?: Yes      Need for Family Participation in Patient Care:  No (Comment) Care giver support system in place?: Yes (comment) Current home services: DME(shower chair, Walker, cane) Criminal Activity/Legal Involvement Pertinent to Current Situation/Hospitalization: No - Comment as needed  Activities of Daily Living Home Assistive Devices/Equipment: None ADL Screening (condition at time of admission) Patient's cognitive ability adequate to safely complete daily activities?: Yes Is the patient deaf or have difficulty hearing?: No Does the patient have difficulty seeing, even when wearing glasses/contacts?: No Does the patient have difficulty concentrating, remembering, or making decisions?: No Patient able to express need for assistance with ADLs?: Yes Does the patient have difficulty dressing or bathing?: No Independently performs ADLs?: Yes (appropriate for developmental age) Does the patient have difficulty walking or climbing stairs?: No Weakness of Legs: None Weakness of Arms/Hands: None  Permission Sought/Granted   Permission granted to share information with : Yes, Verbal Permission Granted              Emotional Assessment Appearance:: Appears stated age Attitude/Demeanor/Rapport: Engaged Affect (typically observed): Appropriate Orientation: : Oriented to Self, Oriented to Place, Oriented to  Time, Oriented to Situation Alcohol / Substance Use: Not Applicable Psych Involvement: No (comment)  Admission diagnosis:  PRIMARY OSTEOARTHRITIS OF LEFT KNEE Patient Active Problem List   Diagnosis Date Noted  . Total knee replacement status 11/12/2018  . Recurrent oral ulcers 11/04/2018  . ANA positive 11/04/2018  . Status  post total right knee replacement 07/02/2018  . Chest heaviness 06/20/2018  . Positive cardiac stress test 06/20/2018  . Primary osteoarthritis of left knee 02/09/2018  . Obstructive sleep apnea 03/02/2016  . Nasal valve collapse 03/02/2016  . Nasal vestibulitis 03/02/2016  . Hyperlipidemia, unspecified 05/04/2014   . Impingement syndrome of shoulder, right 02/16/2014  . Gastroesophageal reflux disease 12/17/2013  . Pure hypercholesterolemia 10/20/2013  . Elevated fasting blood sugar 10/20/2013  . Depression 10/20/2013  . Benign essential hypertension 06/10/2013  . Derangement of posterior horn of medial meniscus 06/10/2013   PCP:  [name], MD Pharmacy:   [facility] [address] [place] - [address] [place] [address] Phone: [phone] Fax: [phone]     Social Determinants of Health (SDOH) Interventions    Readmission Risk Interventions No flowsheet data found.

## 2018-11-13 NOTE — Anesthesia Postprocedure Evaluation (Signed)
Anesthesia Post Note  Patient: Alyssa Mcclure  Procedure(s) Performed: COMPUTER ASSISTED TOTAL KNEE ARTHROPLASTY (Left Knee)  Patient location during evaluation: Nursing Unit Anesthesia Type: Spinal Level of consciousness: awake and alert Pain management: satisfactory to patient Vital Signs Assessment: post-procedure vital signs reviewed and stable Respiratory status: respiratory function stable Cardiovascular status: stable Postop Assessment: no backache, no headache, spinal receding, no apparent nausea or vomiting, patient able to bend at knees, adequate PO intake and able to ambulate Anesthetic complications: no     Last Vitals:  Vitals:   11/12/18 2348 11/13/18 0450  BP: (!) 106/56 123/66  Pulse: 88 89  Resp: 18 19  Temp: 36.9 C 36.9 C  SpO2: 98% 97%    Last Pain:  Vitals:   11/13/18 0610  TempSrc:   PainSc: 4                  Blima Singer

## 2018-11-13 NOTE — Progress Notes (Signed)
Physical Therapy Treatment Patient Details Name: Alyssa Mcclure MRN: [id number] DOB: Dec 14, 1950 Today's Date: 11/13/2018    History of Present Illness Pt admitted for L TKR. History of R TKR back in June 2020.    PT Comments    Pt is making good progress towards goals with increased ambulation this date. Reciprocal gait pattern with limited pain. Good progress with AAROM. Written HEP given and reviewed. Will continue to progress.   Follow Up Recommendations  Home health PT     Equipment Recommendations  None recommended by PT    Recommendations for Other Services       Precautions / Restrictions Precautions Precautions: Fall;Knee Precaution Booklet Issued: Yes (comment) Restrictions Weight Bearing Restrictions: Yes LLE Weight Bearing: Weight bearing as tolerated    Mobility  Bed Mobility Overal bed mobility: Needs Assistance Bed Mobility: Supine to Sit     Supine to sit: Supervision Sit to supine: Supervision   General bed mobility comments: safe technique performed with cues for sequencing  Transfers Overall transfer level: Needs assistance Equipment used: Rolling walker (2 wheeled) Transfers: Sit to/from Stand Sit to Stand: Min guard         General transfer comment: cues for pushing from seated surface  Ambulation/Gait Ambulation/Gait assistance: Min guard Gait Distance (Feet): 120 Feet Assistive device: Rolling walker (2 wheeled) Gait Pattern/deviations: Step-to pattern     General Gait Details: ambulated with reciprocal gait pattern with upright posture. Good heel strike noted   Stairs             Wheelchair Mobility    Modified Rankin (Stroke Patients Only)       Balance Overall balance assessment: Needs assistance Sitting-balance support: Feet supported Sitting balance-Leahy Scale: Good     Standing balance support: Bilateral upper extremity supported Standing balance-Leahy Scale: Good                               Cognition Arousal/Alertness: Awake/alert Behavior During Therapy: WFL for tasks assessed/performed Overall Cognitive Status: Within Functional Limits for tasks assessed                                        Exercises Total Joint Exercises Goniometric ROM: L knee AAROM: 0-78 degrees Other Exercises Other Exercises: Supine ther-ex performed on L LE including AP, quad sets, SAQ, glut sets, SLR, and hip abd/add. All ther-ex performed x 12 reps with cga Other Exercises: Pt instructed in falls prevention, pet care considerations, AE/DME for ADL, home/routines modifications, polar care mgt, and compression stocking mgt; handout provided to support recall and carryover    General Comments        Pertinent Vitals/Pain Pain Assessment: 0-10 Pain Score: 5  Pain Location: L knee Pain Descriptors / Indicators: Operative site guarding Pain Intervention(s): Limited activity within patient's tolerance;Ice applied    Home Living Family/patient expects to be discharged to:: Private residence Living Arrangements: Alone Available Help at Discharge: Family(niece plans to stay 3-4 days post dc) Type of Home: House Home Access: Stairs to enter Entrance Stairs-Rails: Can reach both Home Layout: Two level;Able to live on main level with bedroom/bathroom Home Equipment: Gilmer Mor - single point;Shower seat - built in;Adaptive equipment;Walker - 2 wheels      Prior Function Level of Independence: Independent      Comments: recovered well s/p previous TKR.  PT Goals (current goals can now be found in the care plan section) Acute Rehab PT Goals Patient Stated Goal: to go home PT Goal Formulation: With patient Time For Goal Achievement: 11/26/18 Potential to Achieve Goals: Good Progress towards PT goals: Progressing toward goals    Frequency    BID      PT Plan Current plan remains appropriate    Co-evaluation              AM-PAC PT "6 Clicks" Mobility    Outcome Measure  Help needed turning from your back to your side while in a flat bed without using bedrails?: A Little Help needed moving from lying on your back to sitting on the side of a flat bed without using bedrails?: A Little Help needed moving to and from a bed to a chair (including a wheelchair)?: A Little Help needed standing up from a chair using your arms (e.g., wheelchair or bedside chair)?: A Little Help needed to walk in hospital room?: A Little Help needed climbing 3-5 steps with a railing? : A Lot 6 Click Score: 17    End of Session Equipment Utilized During Treatment: Gait belt Activity Tolerance: Patient tolerated treatment well Patient left: in chair;with chair alarm set;with SCD's reapplied Nurse Communication: Mobility status PT Visit Diagnosis: Difficulty in walking, not elsewhere classified (R26.2);Pain Pain - Right/Left: Left Pain - part of body: Knee     Time: 0940-1003 PT Time Calculation (min) (ACUTE ONLY): 23 min  Charges:  $Gait Training: 8-22 mins $Therapeutic Exercise: 8-22 mins                     Alyssa Mcclure, PT, DPT [phone]    Alyssa Mcclure 11/13/2018, 10:36 AM

## 2018-11-13 NOTE — TOC Progression Note (Signed)
Transition of Care [facility]) - Progression Note    Patient Details  Name: Alyssa Mcclure MRN: [id number] Date of Birth: Mar 26, 1950  Transition of Care [facility]) CM/SW [place], RN Phone Number: 11/13/2018, 9:11 AM  Clinical Narrative:    Requested price of Lovenox 40 mg daily X 14 days, generic ok        Expected Discharge Plan and Services                                                 Social Determinants of Health (SDOH) Interventions    Readmission Risk Interventions No flowsheet data found.

## 2018-11-14 MED ORDER — OXYCODONE HCL 5 MG PO TABS: 5.0000 mg | ORAL_TABLET | ORAL | 0 refills | Status: DC | PRN

## 2018-11-14 MED ORDER — CELECOXIB 200 MG PO CAPS: 200.0000 mg | ORAL_CAPSULE | Freq: Two times a day (BID) | ORAL | 0 refills | Status: DC

## 2018-11-14 MED ORDER — ENOXAPARIN SODIUM 40 MG/0.4ML ~~LOC~~ SOLN: 40.0000 mg | SUBCUTANEOUS | 0 refills | Status: DC

## 2018-11-14 NOTE — Progress Notes (Signed)
Physical Therapy Treatment Patient Details Name: DELPHIA KAYLOR MRN: [id number] DOB: 1950/04/17 Today's Date: 11/14/2018    History of Present Illness Pt admitted for L TKR. History of R TKR back in June 2020.    PT Comments    Pt has met all goals for dc and is safe to dc home this date. Able to perform there-ex and good endurance with ambulation. Upright posture using RW. Stair training performed this date. Slight increased pain.   Follow Up Recommendations  Home health PT     Equipment Recommendations  None recommended by PT    Recommendations for Other Services       Precautions / Restrictions Precautions Precautions: Fall;Knee Precaution Booklet Issued: Yes (comment) Restrictions Weight Bearing Restrictions: Yes LLE Weight Bearing: Weight bearing as tolerated    Mobility  Bed Mobility Overal bed mobility: Needs Assistance Bed Mobility: Supine to Sit     Supine to sit: Supervision     General bed mobility comments: no cues needed for sequencing  Transfers Overall transfer level: Needs assistance Equipment used: Rolling walker (2 wheeled) Transfers: Sit to/from Stand Sit to Stand: Supervision         General transfer comment: cues for pushing from seated surface  Ambulation/Gait Ambulation/Gait assistance: Min guard Gait Distance (Feet): 250 Feet Assistive device: Rolling walker (2 wheeled) Gait Pattern/deviations: Step-through pattern     General Gait Details: reciprocal gait pattern with good speed this date. No rest breaks needed. Upright posture   Stairs Stairs: Yes Stairs assistance: Min guard Stair Management: Two rails Number of Stairs: 4 General stair comments: up/down 4 stairs with B rails. Safe technique with education provided prior to performance.   Wheelchair Mobility    Modified Rankin (Stroke Patients Only)       Balance Overall balance assessment: Needs assistance Sitting-balance support: Feet supported Sitting  balance-Leahy Scale: Good     Standing balance support: Bilateral upper extremity supported Standing balance-Leahy Scale: Good                              Cognition Arousal/Alertness: Awake/alert Behavior During Therapy: WFL for tasks assessed/performed Overall Cognitive Status: Within Functional Limits for tasks assessed                                        Exercises Total Joint Exercises Goniometric ROM: L knee AAROM: 0-90 degreees Other Exercises Other Exercises: Supine ther-ex performed on L LE including AP, quad sets, SAQ, glut sets, SLR, and seated knee flexion. All ther-ex performed x 15 reps with cga Other Exercises: ambulated to bathroom with supervision. CGA for set up and hygiene    General Comments        Pertinent Vitals/Pain Pain Assessment: 0-10 Pain Score: 4  Pain Location: L knee Pain Descriptors / Indicators: Operative site guarding Pain Intervention(s): Limited activity within patient's tolerance;Premedicated before session;Repositioned;Ice applied    Home Living                      Prior Function            PT Goals (current goals can now be found in the care plan section) Acute Rehab PT Goals Patient Stated Goal: to go home PT Goal Formulation: With patient Time For Goal Achievement: 11/26/18 Potential to Achieve Goals: Good Progress towards PT goals:  Physical Therapy Treatment Patient Details Name: Teanna L Decandia MRN: [id number] DOB: 10/29/1950 Today's Date: 11/14/2018    History of Present Illness Pt admitted for L TKR. History of R TKR back in June 2020.    PT Comments    Pt has met all goals for dc and is safe to dc home this date. Able to perform there-ex and good endurance with ambulation. Upright posture using RW. Stair training performed this date. Slight increased pain.   Follow Up Recommendations  Home health PT     Equipment Recommendations  None recommended by PT    Recommendations for Other Services       Precautions / Restrictions Precautions Precautions: Fall;Knee Precaution Booklet Issued: Yes (comment) Restrictions Weight Bearing Restrictions: Yes LLE Weight Bearing: Weight bearing as tolerated    Mobility  Bed Mobility Overal bed mobility: Needs Assistance Bed Mobility: Supine to Sit     Supine to sit: Supervision     General bed mobility comments: no cues needed for sequencing  Transfers Overall transfer level: Needs assistance Equipment used: Rolling walker (2 wheeled) Transfers: Sit to/from Stand Sit to Stand: Supervision         General transfer comment: cues for pushing from seated surface  Ambulation/Gait Ambulation/Gait assistance: Min guard Gait Distance (Feet): 250 Feet Assistive device: Rolling walker (2 wheeled) Gait Pattern/deviations: Step-through pattern     General Gait Details: reciprocal gait pattern with good speed this date. No rest breaks needed. Upright posture   Stairs Stairs: Yes Stairs assistance: Min guard Stair Management: Two rails Number of Stairs: 4 General stair comments: up/down 4 stairs with B rails. Safe technique with education provided prior to performance.   Wheelchair Mobility    Modified Rankin (Stroke Patients Only)       Balance Overall balance assessment: Needs assistance Sitting-balance support: Feet supported Sitting  balance-Leahy Scale: Good     Standing balance support: Bilateral upper extremity supported Standing balance-Leahy Scale: Good                              Cognition Arousal/Alertness: Awake/alert Behavior During Therapy: WFL for tasks assessed/performed Overall Cognitive Status: Within Functional Limits for tasks assessed                                        Exercises Total Joint Exercises Goniometric ROM: L knee AAROM: 0-90 degreees Other Exercises Other Exercises: Supine ther-ex performed on L LE including AP, quad sets, SAQ, glut sets, SLR, and seated knee flexion. All ther-ex performed x 15 reps with cga Other Exercises: ambulated to bathroom with supervision. CGA for set up and hygiene    General Comments        Pertinent Vitals/Pain Pain Assessment: 0-10 Pain Score: 4  Pain Location: L knee Pain Descriptors / Indicators: Operative site guarding Pain Intervention(s): Limited activity within patient's tolerance;Premedicated before session;Repositioned;Ice applied    Home Living                      Prior Function            PT Goals (current goals can now be found in the care plan section) Acute Rehab PT Goals Patient Stated Goal: to go home PT Goal Formulation: With patient Time For Goal Achievement: 11/26/18 Potential to Achieve Goals: Good Progress towards PT goals:

## 2018-11-14 NOTE — Progress Notes (Signed)
D: Pt alert and oriented.  A: Pt received discharge and medication education/information. Pt belongings were gathered and taken with them upon discharge.  R: Pt verbalized understanding of discharge and medication education/information.  Pt escorted via wheelchair by staff to the medical mall front lobby where pt was picked up by family.

## 2018-11-14 NOTE — TOC Transition Note (Signed)
Transition of Care [facility]) - CM/SW Discharge Note   Patient Details  Name: Alyssa Mcclure MRN: [id number] Date of Birth: 04-17-1950  Transition of Care [facility]) CM/SW Contact:  Su Hilt, RN Phone Number: 11/14/2018, 9:04 AM   Clinical Narrative:     Patient to Discharge home with her niece  She has no DME needs and is aware of the Lovenox price   Final next level of care: [place] Barriers to Discharge: Barriers Resolved   Patient Goals and CMS Choice Patient states their goals for this hospitalization and ongoing recovery are:: go home CMS Medicare.gov Compare Post Acute Care list provided to:: Patient Choice offered to / list presented to : Patient  Discharge Placement                       Discharge Plan and Services   Discharge Planning Services: CM Consult Post Acute Care Choice: Home Health          DME Arranged: N/A         HH Arranged: PT HH Agency: [facility] [employer] Date [place]: 11/13/18 Time [place]: 7209 Representative spoke with at [place]: [place] ([place]) Interventions     Readmission Risk Interventions No flowsheet data found.

## 2018-11-14 NOTE — Discharge Summary (Signed)
Physician Discharge Summary  Patient ID: Alyssa Mcclure MRN: [id number] DOB/AGE: [birth year] 68 y.o.  Admit date: 11/12/2018 Discharge date: 11/14/2018  Admission Diagnoses:  PRIMARY OSTEOARTHRITIS OF LEFT KNEE  Surgeries:  Left total knee arthroplasty using computer-assisted navigation  SURGEON:  Jena Gauss. M.D.  ASSISTANT: Baldwin Jamaica, PA-C (present and scrubbed throughout the case, critical for assistance with exposure, retraction, instrumentation, and closure)  ANESTHESIA: spinal  ESTIMATED BLOOD LOSS: 50 mL  FLUIDS REPLACED: 1200 mL of crystalloid  TOURNIQUET TIME: 91 minutes  DRAINS: 2 medium Hemovac drains  SOFT TISSUE RELEASES: Anterior cruciate ligament, posterior cruciate ligament, deep medial collateral ligament, patellofemoral ligament  IMPLANTS UTILIZED: DePuy Attune size 5N posterior stabilized femoral component (cemented), size 5 rotating platform tibial component (cemented), 35 mm medialized dome patella (cemented), and a 5 mm stabilized rotating platform polyethylene insert. Discharge Diagnoses: Patient Active Problem List   Diagnosis Date Noted  . Total knee replacement status 11/12/2018  . Recurrent oral ulcers 11/04/2018  . ANA positive 11/04/2018  . Status post total right knee replacement 07/02/2018  . Chest heaviness 06/20/2018  . Positive cardiac stress test 06/20/2018  . Primary osteoarthritis of left knee 02/09/2018  . Obstructive sleep apnea 03/02/2016  . Nasal valve collapse 03/02/2016  . Nasal vestibulitis 03/02/2016  . Hyperlipidemia, unspecified 05/04/2014  . Impingement syndrome of shoulder, right 02/16/2014  . Gastroesophageal reflux disease 12/17/2013  . Pure hypercholesterolemia 10/20/2013  . Elevated fasting blood sugar 10/20/2013  . Depression 10/20/2013  . Benign essential hypertension 06/10/2013  . Derangement of posterior horn of medial meniscus 06/10/2013    Past Medical History:  Diagnosis Date  . Arthritis    . Depression   . GERD (gastroesophageal reflux disease)   . H/O Bell's palsy   . Heart murmur   . HLD (hyperlipidemia)   . HOH (hard of hearing)   . Hypertension   . Pre-diabetes      Transfusion:    Consultants (if any):   Discharged Condition: Improved  Hospital Course: Alyssa Mcclure is an 68 y.o. female who was admitted 11/12/2018 with a diagnosis of left knee osteoarthritis and went to the operating room on 11/12/2018 and underwent left total knee arthroplasty. The patient received perioperative antibiotics for prophylaxis (see below). The patient tolerated the procedure well and was transported to PACU in stable condition. After meeting PACU criteria, the patient was subsequently transferred to the Orthopaedics/Rehabilitation unit.   The patient received DVT prophylaxis in the form of early mobilization, Lovenox, Foot Pumps and TED hose. A sacral pad had been placed and heels were elevated off of the bed with rolled towels in order to protect skin integrity. Foley catheter was discontinued on postoperative day #1. Wound drains were discontinued on postoperative day #2. The surgical incision was healing well without signs of infection.  Physical therapy was initiated postoperatively for transfers, gait training, and strengthening. Occupational therapy was initiated for activities of daily living and evaluation for assisted devices. Rehabilitation goals were reviewed in detail with the patient. The patient made steady progress with physical therapy and physical therapy recommended discharge to Home.   The patient achieved his preliminary goals of this hospitalization and was felt to be medically and orthopaedically appropriate for discharge.  She was given perioperative antibiotics:  Anti-infectives (From admission, onward)   Start     Dose/Rate Route Frequency Ordered Stop   11/12/18 1600  clindamycin (CLEOCIN) IVPB 600 mg     600 mg 100 mL/hr over 30 Minutes  Intravenous Every 6  hours 11/12/18 1504 11/13/18 1012   11/12/18 1420  clindamycin (CLEOCIN) 600 MG/50ML IVPB    Note to Pharmacy: Agnes Lawrence  : cabinet override      11/12/18 1420 11/13/18 0229   11/12/18 0707  clindamycin (CLEOCIN) 900 MG/50ML IVPB    Note to Pharmacy: Harriette Bouillon   : cabinet override      11/12/18 0707 11/12/18 0840   11/12/18 0600  clindamycin (CLEOCIN) IVPB 900 mg  Status:  Discontinued     900 mg 100 mL/hr over 30 Minutes Intravenous On call to O.R. 11/12/18 0125 11/12/18 0701    .  Recent vital signs:  Vitals:   11/14/18 0557 11/14/18 0727  BP: 138/80 (!) 137/59  Pulse: 77 80  Resp:  16  Temp: 98 F (36.7 C) 97.9 F (36.6 C)  SpO2: 98% 97%    Recent laboratory studies:  No results for input(s): WBC, HGB, HCT, PLT, K, CL, CO2, BUN, CREATININE, GLUCOSE, CALCIUM, LABPT, INR in the last 72 hours.  Diagnostic Studies: Dg Knee Left Port  Result Date: 11/12/2018 CLINICAL DATA:  Status post left total knee replacement. EXAM: PORTABLE LEFT KNEE - 1-2 VIEW COMPARISON:  None. FINDINGS: The components of the total knee prosthesis appear in excellent position. No fractures. Drains present in the joint. IMPRESSION: Satisfactory appearance of the left knee after total knee replacement. Electronically Signed   By: Francene Boyers M.D.   On: 11/12/2018 12:39    Discharge Medications:   Allergies as of 11/14/2018      Reactions   Penicillins Other (See Comments), Hives   Other reaction(s): Other (See Comments) Has patient had a PCN reaction causing immediate rash, facial/tongue/throat swelling, SOB or lightheadedness with hypotension: No Has patient had a PCN reaction causing severe rash involving mucus membranes or skin necrosis: No Has patient had a PCN reaction that required hospitalization No Has patient had a PCN reaction occurring within the last 10 years: No If all of the above answers are "NO", then may proceed with Cephalosporin use. blotches   Adhesive [tape] Itching    Some bandaids   Losartan Cough   Patient states still on Losartan miscategorized   Statins Other (See Comments)   Leg cramps      Medication List    STOP taking these medications   aspirin EC 81 MG tablet     TAKE these medications   celecoxib 200 MG capsule Commonly known as: CELEBREX Take 1 capsule (200 mg total) by mouth 2 (two) times daily.   DIGESTIVE ENZYME PO Take 1 tablet by mouth daily. With prebiotic and probiotics   DULoxetine 60 MG capsule Commonly known as: CYMBALTA Take 60 mg by mouth every morning.   enoxaparin 40 MG/0.4ML injection Commonly known as: LOVENOX Inject 0.4 mLs (40 mg total) into the skin daily for 14 days.   losartan 100 MG tablet Commonly known as: COZAAR Take 100 mg by mouth every morning.   multivitamin tablet Take 1 tablet by mouth daily.   oxyCODONE 5 MG immediate release tablet Commonly known as: Oxy IR/ROXICODONE Take 1 tablet (5 mg total) by mouth every 4 (four) hours as needed for moderate pain (pain score 4-6).   pantoprazole 40 MG tablet Commonly known as: PROTONIX Take 40 mg by mouth every morning.   traZODone 50 MG tablet Commonly known as: DESYREL Take 50 mg by mouth at bedtime as needed for sleep.  Durable Medical Equipment  (From admission, onward)         Start     Ordered   11/12/18 1504  DME Walker rolling  Once    Question:  Patient needs a walker to treat with the following condition  Answer:  Total knee replacement status   11/12/18 1504   11/12/18 1504  DME Bedside commode  Once    Question:  Patient needs a bedside commode to treat with the following condition  Answer:  Total knee replacement status   11/12/18 1504          Disposition: Discharge disposition: 01-Home or Self Care         Follow-up Information    Madelyn Flavors, PA-C On 11/27/2018.   Specialty: Orthopedic Surgery Why: at 8:15am Contact information: [address] [facility]  [facility] [place] [address] [phone]        Donato Heinz, MD On 12/25/2018.   Specialty: Orthopedic Surgery Why: at 9:45am Contact information: [address] [facility] [place] [address] [phone]            Lasandra Beech, PA-C 11/14/2018, 8:08 AM

## 2018-11-14 NOTE — Progress Notes (Signed)
  Subjective: 2 Days Post-Op Procedure(s) (LRB): COMPUTER ASSISTED TOTAL KNEE ARTHROPLASTY (Left) Patient reports pain as well-controlled.   Patient is well, and has had no acute complaints or problems Plan is to go Home after hospital stay. Negative for chest pain and shortness of breath Fever: no Gastrointestinal: negative for nausea and vomiting.  Patient has had a bowel movement.  Objective: Vital signs in last 24 hours: Temp:  [97.8 F (36.6 C)-98 F (36.7 C)] 97.9 F (36.6 C) (11/06 0727) Pulse Rate:  [77-80] 80 (11/06 0727) Resp:  [16] 16 (11/06 0727) BP: (123-138)/(59-80) 137/59 (11/06 0727) SpO2:  [97 %-100 %] 97 % (11/06 0727)  Intake/Output from previous day:  Intake/Output Summary (Last 24 hours) at 11/14/2018 0802 Last data filed at 11/13/2018 2300 Gross per 24 hour  Intake -  Output 0 ml  Net 0 ml    Intake/Output this shift: No intake/output data recorded.  Labs: No results for input(s): HGB in the last 72 hours. No results for input(s): WBC, RBC, HCT, PLT in the last 72 hours. No results for input(s): NA, K, CL, CO2, BUN, CREATININE, GLUCOSE, CALCIUM in the last 72 hours. No results for input(s): LABPT, INR in the last 72 hours.   EXAM General - Patient is Alert, Appropriate and Oriented Extremity - Neurovascular intact Dorsiflexion/Plantar flexion intact Compartment soft Dressing/Incision -postoperative dressing remains in place.  Hemovac remains in place.  Polar Care in place. Motor Function - intact, moving foot and toes well on exam.  Able to perform straight leg raise. Cardiovascular- Regular rate and rhythm, no murmurs/rubs/gallops Respiratory- Lungs clear to auscultation bilaterally Gastrointestinal- soft, nontender and active bowel sounds   Assessment/Plan: 2 Days Post-Op Procedure(s) (LRB): COMPUTER ASSISTED TOTAL KNEE ARTHROPLASTY (Left) Active Problems:   Total knee replacement status  Estimated body mass index is 29.79 kg/m as  calculated from the following:   Height as of this encounter: 5\' 5"  (1.651 m).   Weight as of this encounter: 81.2 kg. Advance diet Up with therapy Discharge home with home health  Hemovac removed.  Postoperative dressing removed.  DVT Prophylaxis - Lovenox, Ted hose and foot pumps Weight-Bearing as tolerated to left leg  Cassell Smiles, PA-C [facility] Orth[facility] 11/14/2018, 8:02 AM

## 2018-11-16 DIAGNOSIS — J3489 Other specified disorders of nose and nasal sinuses: Secondary | ICD-10-CM | POA: Diagnosis not present

## 2018-11-16 DIAGNOSIS — Z471 Aftercare following joint replacement surgery: Secondary | ICD-10-CM | POA: Diagnosis not present

## 2018-11-16 DIAGNOSIS — I10 Essential (primary) hypertension: Secondary | ICD-10-CM | POA: Diagnosis not present

## 2018-11-16 DIAGNOSIS — K219 Gastro-esophageal reflux disease without esophagitis: Secondary | ICD-10-CM | POA: Diagnosis not present

## 2018-11-16 DIAGNOSIS — E7800 Pure hypercholesterolemia, unspecified: Secondary | ICD-10-CM | POA: Diagnosis not present

## 2018-11-16 DIAGNOSIS — F329 Major depressive disorder, single episode, unspecified: Secondary | ICD-10-CM | POA: Diagnosis not present

## 2018-11-16 DIAGNOSIS — Z96653 Presence of artificial knee joint, bilateral: Secondary | ICD-10-CM | POA: Diagnosis not present

## 2018-11-16 DIAGNOSIS — M7541 Impingement syndrome of right shoulder: Secondary | ICD-10-CM | POA: Diagnosis not present

## 2018-11-16 DIAGNOSIS — G4733 Obstructive sleep apnea (adult) (pediatric): Secondary | ICD-10-CM | POA: Diagnosis not present

## 2018-11-18 DIAGNOSIS — E7800 Pure hypercholesterolemia, unspecified: Secondary | ICD-10-CM | POA: Diagnosis not present

## 2018-11-18 DIAGNOSIS — M7541 Impingement syndrome of right shoulder: Secondary | ICD-10-CM | POA: Diagnosis not present

## 2018-11-18 DIAGNOSIS — Z96653 Presence of artificial knee joint, bilateral: Secondary | ICD-10-CM | POA: Diagnosis not present

## 2018-11-18 DIAGNOSIS — F329 Major depressive disorder, single episode, unspecified: Secondary | ICD-10-CM | POA: Diagnosis not present

## 2018-11-18 DIAGNOSIS — K219 Gastro-esophageal reflux disease without esophagitis: Secondary | ICD-10-CM | POA: Diagnosis not present

## 2018-11-18 DIAGNOSIS — I10 Essential (primary) hypertension: Secondary | ICD-10-CM | POA: Diagnosis not present

## 2018-11-18 DIAGNOSIS — G4733 Obstructive sleep apnea (adult) (pediatric): Secondary | ICD-10-CM | POA: Diagnosis not present

## 2018-11-18 DIAGNOSIS — J3489 Other specified disorders of nose and nasal sinuses: Secondary | ICD-10-CM | POA: Diagnosis not present

## 2018-11-18 DIAGNOSIS — Z471 Aftercare following joint replacement surgery: Secondary | ICD-10-CM | POA: Diagnosis not present

## 2018-11-21 DIAGNOSIS — G4733 Obstructive sleep apnea (adult) (pediatric): Secondary | ICD-10-CM | POA: Diagnosis not present

## 2018-11-21 DIAGNOSIS — I10 Essential (primary) hypertension: Secondary | ICD-10-CM | POA: Diagnosis not present

## 2018-11-21 DIAGNOSIS — F329 Major depressive disorder, single episode, unspecified: Secondary | ICD-10-CM | POA: Diagnosis not present

## 2018-11-21 DIAGNOSIS — Z471 Aftercare following joint replacement surgery: Secondary | ICD-10-CM | POA: Diagnosis not present

## 2018-11-21 DIAGNOSIS — M7541 Impingement syndrome of right shoulder: Secondary | ICD-10-CM | POA: Diagnosis not present

## 2018-11-21 DIAGNOSIS — E7800 Pure hypercholesterolemia, unspecified: Secondary | ICD-10-CM | POA: Diagnosis not present

## 2018-11-21 DIAGNOSIS — J3489 Other specified disorders of nose and nasal sinuses: Secondary | ICD-10-CM | POA: Diagnosis not present

## 2018-11-21 DIAGNOSIS — K219 Gastro-esophageal reflux disease without esophagitis: Secondary | ICD-10-CM | POA: Diagnosis not present

## 2018-11-21 DIAGNOSIS — Z96653 Presence of artificial knee joint, bilateral: Secondary | ICD-10-CM | POA: Diagnosis not present

## 2018-11-24 DIAGNOSIS — Z96653 Presence of artificial knee joint, bilateral: Secondary | ICD-10-CM | POA: Diagnosis not present

## 2018-11-24 DIAGNOSIS — K219 Gastro-esophageal reflux disease without esophagitis: Secondary | ICD-10-CM | POA: Diagnosis not present

## 2018-11-24 DIAGNOSIS — E7800 Pure hypercholesterolemia, unspecified: Secondary | ICD-10-CM | POA: Diagnosis not present

## 2018-11-24 DIAGNOSIS — G4733 Obstructive sleep apnea (adult) (pediatric): Secondary | ICD-10-CM | POA: Diagnosis not present

## 2018-11-24 DIAGNOSIS — J3489 Other specified disorders of nose and nasal sinuses: Secondary | ICD-10-CM | POA: Diagnosis not present

## 2018-11-24 DIAGNOSIS — M7541 Impingement syndrome of right shoulder: Secondary | ICD-10-CM | POA: Diagnosis not present

## 2018-11-24 DIAGNOSIS — Z471 Aftercare following joint replacement surgery: Secondary | ICD-10-CM | POA: Diagnosis not present

## 2018-11-24 DIAGNOSIS — F329 Major depressive disorder, single episode, unspecified: Secondary | ICD-10-CM | POA: Diagnosis not present

## 2018-11-24 DIAGNOSIS — I10 Essential (primary) hypertension: Secondary | ICD-10-CM | POA: Diagnosis not present

## 2018-11-26 DIAGNOSIS — Z471 Aftercare following joint replacement surgery: Secondary | ICD-10-CM | POA: Diagnosis not present

## 2018-11-26 DIAGNOSIS — G4733 Obstructive sleep apnea (adult) (pediatric): Secondary | ICD-10-CM | POA: Diagnosis not present

## 2018-11-26 DIAGNOSIS — I10 Essential (primary) hypertension: Secondary | ICD-10-CM | POA: Diagnosis not present

## 2018-11-26 DIAGNOSIS — J3489 Other specified disorders of nose and nasal sinuses: Secondary | ICD-10-CM | POA: Diagnosis not present

## 2018-11-26 DIAGNOSIS — F329 Major depressive disorder, single episode, unspecified: Secondary | ICD-10-CM | POA: Diagnosis not present

## 2018-11-26 DIAGNOSIS — Z96653 Presence of artificial knee joint, bilateral: Secondary | ICD-10-CM | POA: Diagnosis not present

## 2018-11-26 DIAGNOSIS — E7800 Pure hypercholesterolemia, unspecified: Secondary | ICD-10-CM | POA: Diagnosis not present

## 2018-11-26 DIAGNOSIS — M7541 Impingement syndrome of right shoulder: Secondary | ICD-10-CM | POA: Diagnosis not present

## 2018-11-26 DIAGNOSIS — K219 Gastro-esophageal reflux disease without esophagitis: Secondary | ICD-10-CM | POA: Diagnosis not present

## 2018-11-27 ENCOUNTER — Ambulatory Visit: Payer: Medicare HMO | Attending: Infectious Diseases | Admitting: Infectious Diseases

## 2018-11-27 ENCOUNTER — Other Ambulatory Visit: Payer: Self-pay

## 2018-11-27 ENCOUNTER — Encounter: Payer: Self-pay | Admitting: Infectious Diseases

## 2018-11-27 VITALS — BP 145/73 | HR 65 | Temp 97.8°F | Resp 16 | Ht 65.0 in | Wt 184.0 lb

## 2018-11-27 DIAGNOSIS — I10 Essential (primary) hypertension: Secondary | ICD-10-CM

## 2018-11-27 DIAGNOSIS — M25662 Stiffness of left knee, not elsewhere classified: Secondary | ICD-10-CM | POA: Diagnosis not present

## 2018-11-27 DIAGNOSIS — K1379 Other lesions of oral mucosa: Secondary | ICD-10-CM

## 2018-11-27 DIAGNOSIS — G8929 Other chronic pain: Secondary | ICD-10-CM | POA: Diagnosis not present

## 2018-11-27 DIAGNOSIS — K219 Gastro-esophageal reflux disease without esophagitis: Secondary | ICD-10-CM | POA: Diagnosis not present

## 2018-11-27 DIAGNOSIS — F339 Major depressive disorder, recurrent, unspecified: Secondary | ICD-10-CM | POA: Diagnosis not present

## 2018-11-27 DIAGNOSIS — E538 Deficiency of other specified B group vitamins: Secondary | ICD-10-CM

## 2018-11-27 DIAGNOSIS — Z888 Allergy status to other drugs, medicaments and biological substances status: Secondary | ICD-10-CM

## 2018-11-27 DIAGNOSIS — Z79899 Other long term (current) drug therapy: Secondary | ICD-10-CM

## 2018-11-27 DIAGNOSIS — Z880 Allergy status to penicillin: Secondary | ICD-10-CM

## 2018-11-27 DIAGNOSIS — Z87891 Personal history of nicotine dependence: Secondary | ICD-10-CM

## 2018-11-27 DIAGNOSIS — Z8669 Personal history of other diseases of the nervous system and sense organs: Secondary | ICD-10-CM | POA: Diagnosis not present

## 2018-11-27 DIAGNOSIS — Z96652 Presence of left artificial knee joint: Secondary | ICD-10-CM | POA: Diagnosis not present

## 2018-11-27 DIAGNOSIS — H9190 Unspecified hearing loss, unspecified ear: Secondary | ICD-10-CM | POA: Diagnosis not present

## 2018-11-27 DIAGNOSIS — Z87440 Personal history of urinary (tract) infections: Secondary | ICD-10-CM

## 2018-11-27 DIAGNOSIS — R7303 Prediabetes: Secondary | ICD-10-CM

## 2018-11-27 DIAGNOSIS — M6281 Muscle weakness (generalized): Secondary | ICD-10-CM | POA: Diagnosis not present

## 2018-11-27 DIAGNOSIS — Z96653 Presence of artificial knee joint, bilateral: Secondary | ICD-10-CM | POA: Diagnosis not present

## 2018-11-27 DIAGNOSIS — Z91048 Other nonmedicinal substance allergy status: Secondary | ICD-10-CM

## 2018-11-27 DIAGNOSIS — M25562 Pain in left knee: Secondary | ICD-10-CM | POA: Diagnosis not present

## 2018-11-27 NOTE — Progress Notes (Signed)
NAME: Alyssa Mcclure  DOB: 13-Dec-1950  MRN: [id number]  Date/Time: 11/27/2018 10:21 AM   Subjective:  REASON FOR CONSULT: Recurrent oral ulcers ? Alyssa Mcclure is a 68 y.o. female with a history of hypertension, prediabetes, hard of hearing, GERD, history of Bell's palsy presents to me because of recurrent oral ulcers for the past year or so. Patient is a good historian and she says she was in [place] in November 2018.  She had a fish dinner but it did not taste good and so she spit it out.  December 2018 she had a urinary tract infection with E. coli and was given ciprofloxacin. In February 2019 she noted oral ulcers.  She initially was given Magic mouthwash and was followed by her PCP.  Also it kept recurring in the same spot in the lower lip area as well as the inner cheeks.  Pt this year had seen the dentist as well as ENT Dr. Jenne Campus.  A biopsy was done in August 2020 and that showed necrotic area with mixed inflammatory infiltrate.  I am not sure whether any cultures or special stains have been done.  Patient received a course of oral prednisone for 5 weeks.  And she is also had intermittent steroids given by her PCP.  She then went to see a rheumatologist and the blood work from the showed a ANA which is reactive but no titers available, and IgG level which is on the borderline lower side.  Her rheumatologist also checked her for HIV, syphilis, herpes and they were all negative.  She has a low B12 and is on supplement. Patient denies any fever patient has not had any lesions in her vagina or her eyes.  She has dry mouth and dry eyes.  She is not sexually active.  She is originally from [place] and has been in [place] for the last couple of years.  She has had trouble with some swallowing and GERD-like symptoms and has undergone endoscopy and found to have hiatus hernia and has had esophageal dilatation at one time.  She has been on chronic PPI. She underwent recently a left TKA  11/12/18. Rt TKA on 07/16/18   Past Medical History:  Diagnosis Date  . Arthritis   . Depression   . GERD (gastroesophageal reflux disease)   . H/O Bell's palsy   . Heart murmur   . HLD (hyperlipidemia)   . HOH (hard of hearing)   . Hypertension   . Pre-diabetes     Past Surgical History:  Procedure Laterality Date  . ABDOMINAL HYSTERECTOMY    . ABDOMINOPLASTY    . BILATERAL CARPAL TUNNEL RELEASE Bilateral   . bladder lift  2011  . BROW LIFT Bilateral 06/12/2016   Procedure: BLEPHAROPLASTY UPPER EYELID WITH EXCESS SKIN;  Surgeon: Imagene Riches, MD;  [facility];  Service: Ophthalmology;  Laterality: Bilateral;  . CATARACT EXTRACTION W/PHACO Right 05/09/2015   Procedure: CATARACT EXTRACTION PHACO AND INTRAOCULAR LENS PLACEMENT (IOC);  Surgeon: Sallee Lange, MD;  Location: [facility];  Service: Ophthalmology;  Laterality: Right;  [place] 45.1 AP% 21.0 CDE 19.36 Fluid Pack Lot # [id number]  . CATARACT EXTRACTION W/PHACO Left 03/28/2016   Procedure: CATARACT EXTRACTION PHACO AND INTRAOCULAR LENS PLACEMENT (IOC);  Surgeon: Sallee Lange, MD;  Location: [facility];  Service: Ophthalmology;  Laterality: Left;  [place] 01:05 AP% 21.3 CDE 24.21 fluid pack lot # [id number]  . CESAREAN SECTION    . HERNIA REPAIR    . KNEE  ARTHROPLASTY Right 07/02/2018   Procedure: COMPUTER ASSISTED TOTAL KNEE ARTHROPLASTY RIGHT;  Surgeon: Donato Heinz, MD;  Location: [facility];  Service: Orthopedics;  Laterality: Right;  . KNEE ARTHROPLASTY Left 11/12/2018   Procedure: COMPUTER ASSISTED TOTAL KNEE ARTHROPLASTY;  Surgeon: Donato Heinz, MD;  Location: [facility];  Service: Orthopedics;  Laterality: Left;  . KNEE ARTHROSCOPY Bilateral   . ROTATOR CUFF REPAIR Bilateral     Social History   Socioeconomic History  . Marital status: Divorced    Spouse name: Not on file  . Number of children: Not on file  . Years of education: Not on file  . Highest education level: Not on file  Occupational History   . Not on file  Social Needs  . Financial resource strain: Not on file  . Food insecurity    Worry: Not on file    Inability: Not on file  . Transportation needs    Medical: Not on file    Non-medical: Not on file  Tobacco Use  . Smoking status: Former Smoker    Types: Cigarettes    Quit date: 2005    Years since quitting: 15.8  . Smokeless tobacco: Never Used  . Tobacco comment: quit 10 + years  Substance and Sexual Activity  . Alcohol use: Yes    Alcohol/week: 1.0 standard drinks    Types: 1 Glasses of wine per week    Comment: occ  . Drug use: No  . Sexual activity: Not on file  Lifestyle  . Physical activity    Days per week: Not on file    Minutes per session: Not on file  . Stress: Not on file  Relationships  . Social Musician on phone: Not on file    Gets together: Not on file    Attends religious service: Not on file    Active member of club or organization: Not on file    Attends meetings of clubs or organizations: Not on file    Relationship status: Not on file  . Intimate partner violence    Fear of current or ex partner: Not on file    Emotionally abused: Not on file    Physically abused: Not on file    Forced sexual activity: Not on file  Other Topics Concern  . Not on file  Social History Narrative  . Not on file    Family History  Problem Relation Age of Onset  . Breast cancer Daughter 51  . Breast cancer Paternal Aunt   . Breast cancer Cousin        2 cousins 1 mat, 1 pat  . Prostate cancer Neg Hx   . Kidney cancer Neg Hx   . Bladder Cancer Neg Hx    Allergies  Allergen Reactions  . Penicillins Other (See Comments) and Hives    Other reaction(s): Other (See Comments) Has patient had a PCN reaction causing immediate rash, facial/tongue/throat swelling, SOB or lightheadedness with hypotension: No Has patient had a PCN reaction causing severe rash involving mucus membranes or skin necrosis: No Has patient had a PCN reaction that  required hospitalization No Has patient had a PCN reaction occurring within the last 10 years: No If all of the above answers are "NO", then may proceed with Cephalosporin use.  blotches  . Adhesive [Tape] Itching    Some bandaids  . Losartan Cough    Patient states still on Losartan miscategorized  . Statins Other (See Comments)  Leg cramps    ? Current Outpatient Medications  Medication Sig Dispense Refill  . celecoxib (CELEBREX) 200 MG capsule Take 1 capsule (200 mg total) by mouth 2 (two) times daily. 84 capsule 0  . Digestive Enzymes (DIGESTIVE ENZYME PO) Take 1 tablet by mouth daily. With prebiotic and probiotics    . doxycycline (VIBRAMYCIN) 100 MG capsule     . DULoxetine (CYMBALTA) 60 MG capsule Take 60 mg by mouth every morning.     . enoxaparin (LOVENOX) 40 MG/0.4ML injection Inject 0.4 mLs (40 mg total) into the skin daily for 14 days. 5.6 mL 0  . fluconazole (DIFLUCAN) 150 MG tablet     . losartan (COZAAR) 100 MG tablet Take 100 mg by mouth every morning.     . Multiple Vitamin (MULTIVITAMIN) tablet Take 1 tablet by mouth daily.    Marland Kitchen oxyCODONE (OXY IR/ROXICODONE) 5 MG immediate release tablet Take 1 tablet (5 mg total) by mouth every 4 (four) hours as needed for moderate pain (pain score 4-6). 30 tablet 0  . pantoprazole (PROTONIX) 40 MG tablet Take 40 mg by mouth every morning.     . traZODone (DESYREL) 50 MG tablet Take 50 mg by mouth at bedtime as needed for sleep.      No current facility-administered medications for this visit.      Abtx:  Anti-infectives (From admission, onward)   None      REVIEW OF SYSTEMS:  Const: negative fever, negative chills, negative weight loss Eyes: negative diplopia or visual changes, negative eye pain ENT: negative coryza, negative sore throat Resp: negative cough, hemoptysis, dyspnea Cards: negative for chest pain, palpitations, lower extremity edema GU: negative for frequency, dysuria and hematuria GI: As above Skin:  negative for rash and pruritus Heme: negative for easy bruising and gum/nose bleeding MS: pain knees Neurolo:negative for headaches, dizziness, vertigo, memory problems  Psych: History of depression Endocrine: negative for thyroid, diabetes Allergy/Immunology-as noted above including penicillin which she says has given her a rash Objective:  VITALS:  BP (!) 145/73   Pulse 65   Temp 97.8 F (36.6 C)   Resp 16   Ht 5\' 5"  (1.651 m)   Wt 184 lb (83.5 kg)   SpO2 96%   BMI 30.62 kg/m  PHYSICAL EXAM:  General: Alert, cooperative, no distress, appears stated age. Hard of hearing Head: Normocephalic, without obvious abnormality, atraumatic. Eyes: Conjunctivae clear, anicteric sclerae. Pupils are equal ENT Nares normal. No drainage or sinus tenderness. Lips, mucosa, and tongue normal. No Thrush. No lesions seen except for a bite mark inside her left cheek mucosa Neck: Supple, symmetrical, no adenopathy, thyroid: non tender no carotid bruit and no JVD. Supraclavicular  Fullness- pad of fat Back: No CVA tenderness. Lungs: Clear to auscultation bilaterally. No Wheezing or Rhonchi. No rales. Heart: Regular rate and rhythm, no murmur, rub or gallop. Abdomen: Soft, non-tender,not distended. Bowel sounds normal. No masses Extremities: b/l TKA- left - steristrips present Rt TKA scar healed well Skin: No rashes or lesions. Or bruising Lymph: Cervical, supraclavicular normal. Neurologic: Grossly non-focal Pertinent Labs Lab Results CBC    Component Value Date/Time   WBC 6.5 11/07/2018 0914   RBC 4.39 11/07/2018 0914   HGB 11.4 (L) 11/07/2018 0914   HCT 36.9 11/07/2018 0914   PLT 215 11/07/2018 0914   MCV 84.1 11/07/2018 0914   MCH 26.0 11/07/2018 0914   MCHC 30.9 11/07/2018 0914   RDW 17.8 (H) 11/07/2018 0914    CMP Latest Ref Rng &  Units 11/07/2018 06/24/2018 07/04/2009  Glucose 70 - 99 mg/dL 063(K) 160(F) 86  BUN 8 - 23 mg/dL 18 15 10   Creatinine 0.44 - 1.00 mg/dL 0.93 2.35 5.73   Sodium 135 - 145 mmol/L 139 134(L) 136  Potassium 3.5 - 5.1 mmol/L 3.9 3.8 3.7  Chloride 98 - 111 mmol/L 104 97(L) 101  CO2 22 - 32 mmol/L 26 25 30   Calcium 8.9 - 10.3 mg/dL 9.1 9.4 9.3  Total Protein 6.5 - 8.1 g/dL 6.6 7.0 7.2  Total Bilirubin 0.3 - 1.2 mg/dL 0.5 0.7 0.6  Alkaline Phos 38 - 126 U/L 103 102 120(H)  AST 15 - 41 U/L 19 21 53(H)  ALT 0 - 44 U/L 21 19 56(H)      Infectious disease labs Reviewed [facility] clinic records 11/04/18 HIV NR RPR ( cannot see result) HSV  HEPC-NR   ? Impression/Recommendation ? Recurrent oral ulcers- happening at the same spot- some relief with steroids D.D is long  a) traumatic, b) autoimmune ( including lupus, pemphigoid, lichen planus, Behcets, ) c) infection ( HIV, RPR, CMV, HSV, VZV, histoplasma) d) aphthous ulcer, e) nutritional deficiency F) drugs I doubt she has either autoimmune or infectious etiology. Will see whether the biopsy that was done in Aug can be stained for special stains and viral culture , ?  Trauma need to be ruled out as it is in the same spot as per patient  Vit B12 deficiency could be from prolonged PPI use She could have vitamin C deficiency  from the same Avoid Prolonged PPI use  GERD- avoid spicy food which she loves and consumes a lot  C/o supraclavicular  swelling which she says is Lymphnode but likely  supraclavicular pad of fat from steroid use- she will discuss with her PCP  Depression on cymbalta  B/l TKA- looks well  Spent more tham 50 minutes with the patient   Follow PRN ___________________________________________________ Discussed with patient,and his niece who accompanied her Note:  This document was prepared using Dragon voice recognition software and may include unintentional dictation errors.

## 2018-11-27 NOTE — Patient Instructions (Addendum)
You are here for recurrent oral ulcers- you get ulcers on the same spo lower and inside cheeks. You have been investigated for the followin g-HIV/RPR/HSV /HEPC and they are negative You have been checked for Autoimmune disorder- ANA +, not sure of the titer, IgG on the lower side And low B12 ( which could be due to PPI- proton pump inhibitors)  I will check with ENT regarding the biopsy to see whether any special stains can be done to r/o any autoimmune disease or infection- I doubt this is infection- you may need to increase vitc containing food like lime/lemon and oranges You may want to check with dentist to see whether it is frictional and have a mouth guard.

## 2018-12-01 DIAGNOSIS — M25562 Pain in left knee: Secondary | ICD-10-CM | POA: Diagnosis not present

## 2018-12-01 DIAGNOSIS — M25662 Stiffness of left knee, not elsewhere classified: Secondary | ICD-10-CM | POA: Diagnosis not present

## 2018-12-01 DIAGNOSIS — Z96652 Presence of left artificial knee joint: Secondary | ICD-10-CM | POA: Diagnosis not present

## 2018-12-01 DIAGNOSIS — G8929 Other chronic pain: Secondary | ICD-10-CM | POA: Diagnosis not present

## 2018-12-01 DIAGNOSIS — M6281 Muscle weakness (generalized): Secondary | ICD-10-CM | POA: Diagnosis not present

## 2018-12-03 DIAGNOSIS — G8929 Other chronic pain: Secondary | ICD-10-CM | POA: Diagnosis not present

## 2018-12-03 DIAGNOSIS — M6281 Muscle weakness (generalized): Secondary | ICD-10-CM | POA: Diagnosis not present

## 2018-12-03 DIAGNOSIS — M25562 Pain in left knee: Secondary | ICD-10-CM | POA: Diagnosis not present

## 2018-12-03 DIAGNOSIS — M25662 Stiffness of left knee, not elsewhere classified: Secondary | ICD-10-CM | POA: Diagnosis not present

## 2018-12-03 DIAGNOSIS — Z96652 Presence of left artificial knee joint: Secondary | ICD-10-CM | POA: Diagnosis not present

## 2018-12-08 DIAGNOSIS — E538 Deficiency of other specified B group vitamins: Secondary | ICD-10-CM | POA: Diagnosis not present

## 2018-12-09 DIAGNOSIS — M6281 Muscle weakness (generalized): Secondary | ICD-10-CM | POA: Diagnosis not present

## 2018-12-09 DIAGNOSIS — M25562 Pain in left knee: Secondary | ICD-10-CM | POA: Diagnosis not present

## 2018-12-09 DIAGNOSIS — M25662 Stiffness of left knee, not elsewhere classified: Secondary | ICD-10-CM | POA: Diagnosis not present

## 2018-12-09 DIAGNOSIS — G8929 Other chronic pain: Secondary | ICD-10-CM | POA: Diagnosis not present

## 2018-12-09 DIAGNOSIS — Z96652 Presence of left artificial knee joint: Secondary | ICD-10-CM | POA: Diagnosis not present

## 2018-12-11 DIAGNOSIS — M6281 Muscle weakness (generalized): Secondary | ICD-10-CM | POA: Diagnosis not present

## 2018-12-11 DIAGNOSIS — M25662 Stiffness of left knee, not elsewhere classified: Secondary | ICD-10-CM | POA: Diagnosis not present

## 2018-12-11 DIAGNOSIS — Z96652 Presence of left artificial knee joint: Secondary | ICD-10-CM | POA: Diagnosis not present

## 2018-12-11 DIAGNOSIS — G8929 Other chronic pain: Secondary | ICD-10-CM | POA: Diagnosis not present

## 2018-12-11 DIAGNOSIS — M25562 Pain in left knee: Secondary | ICD-10-CM | POA: Diagnosis not present

## 2018-12-16 DIAGNOSIS — Z96652 Presence of left artificial knee joint: Secondary | ICD-10-CM | POA: Diagnosis not present

## 2018-12-18 DIAGNOSIS — M25562 Pain in left knee: Secondary | ICD-10-CM | POA: Diagnosis not present

## 2018-12-18 DIAGNOSIS — Z96652 Presence of left artificial knee joint: Secondary | ICD-10-CM | POA: Diagnosis not present

## 2018-12-21 DIAGNOSIS — Z471 Aftercare following joint replacement surgery: Secondary | ICD-10-CM | POA: Diagnosis not present

## 2018-12-23 DIAGNOSIS — M6281 Muscle weakness (generalized): Secondary | ICD-10-CM | POA: Diagnosis not present

## 2018-12-23 DIAGNOSIS — M25662 Stiffness of left knee, not elsewhere classified: Secondary | ICD-10-CM | POA: Diagnosis not present

## 2018-12-23 DIAGNOSIS — Z96652 Presence of left artificial knee joint: Secondary | ICD-10-CM | POA: Diagnosis not present

## 2018-12-23 DIAGNOSIS — M25562 Pain in left knee: Secondary | ICD-10-CM | POA: Diagnosis not present

## 2018-12-23 DIAGNOSIS — G8929 Other chronic pain: Secondary | ICD-10-CM | POA: Diagnosis not present

## 2018-12-25 DIAGNOSIS — Z96652 Presence of left artificial knee joint: Secondary | ICD-10-CM | POA: Diagnosis not present

## 2018-12-25 DIAGNOSIS — M1712 Unilateral primary osteoarthritis, left knee: Secondary | ICD-10-CM | POA: Diagnosis not present

## 2018-12-30 DIAGNOSIS — M6281 Muscle weakness (generalized): Secondary | ICD-10-CM | POA: Diagnosis not present

## 2018-12-30 DIAGNOSIS — G8929 Other chronic pain: Secondary | ICD-10-CM | POA: Diagnosis not present

## 2018-12-30 DIAGNOSIS — M25662 Stiffness of left knee, not elsewhere classified: Secondary | ICD-10-CM | POA: Diagnosis not present

## 2018-12-30 DIAGNOSIS — Z96652 Presence of left artificial knee joint: Secondary | ICD-10-CM | POA: Diagnosis not present

## 2018-12-30 DIAGNOSIS — M25562 Pain in left knee: Secondary | ICD-10-CM | POA: Diagnosis not present

## 2019-01-06 DIAGNOSIS — G8929 Other chronic pain: Secondary | ICD-10-CM | POA: Diagnosis not present

## 2019-01-06 DIAGNOSIS — Z96652 Presence of left artificial knee joint: Secondary | ICD-10-CM | POA: Diagnosis not present

## 2019-01-06 DIAGNOSIS — M6281 Muscle weakness (generalized): Secondary | ICD-10-CM | POA: Diagnosis not present

## 2019-01-06 DIAGNOSIS — M25562 Pain in left knee: Secondary | ICD-10-CM | POA: Diagnosis not present

## 2019-01-06 DIAGNOSIS — M25662 Stiffness of left knee, not elsewhere classified: Secondary | ICD-10-CM | POA: Diagnosis not present

## 2019-01-08 DIAGNOSIS — E538 Deficiency of other specified B group vitamins: Secondary | ICD-10-CM | POA: Diagnosis not present

## 2019-02-06 DIAGNOSIS — Z780 Asymptomatic menopausal state: Secondary | ICD-10-CM | POA: Diagnosis not present

## 2019-02-06 DIAGNOSIS — M170 Bilateral primary osteoarthritis of knee: Secondary | ICD-10-CM | POA: Diagnosis not present

## 2019-02-06 DIAGNOSIS — F325 Major depressive disorder, single episode, in full remission: Secondary | ICD-10-CM | POA: Diagnosis not present

## 2019-02-06 DIAGNOSIS — K219 Gastro-esophageal reflux disease without esophagitis: Secondary | ICD-10-CM | POA: Diagnosis not present

## 2019-02-06 DIAGNOSIS — R1310 Dysphagia, unspecified: Secondary | ICD-10-CM | POA: Diagnosis not present

## 2019-02-06 DIAGNOSIS — R7303 Prediabetes: Secondary | ICD-10-CM | POA: Diagnosis not present

## 2019-02-06 DIAGNOSIS — Z0000 Encounter for general adult medical examination without abnormal findings: Secondary | ICD-10-CM | POA: Diagnosis not present

## 2019-02-06 DIAGNOSIS — K1379 Other lesions of oral mucosa: Secondary | ICD-10-CM | POA: Diagnosis not present

## 2019-02-06 DIAGNOSIS — E782 Mixed hyperlipidemia: Secondary | ICD-10-CM | POA: Diagnosis not present

## 2019-02-06 DIAGNOSIS — E538 Deficiency of other specified B group vitamins: Secondary | ICD-10-CM | POA: Diagnosis not present

## 2019-02-17 DIAGNOSIS — E538 Deficiency of other specified B group vitamins: Secondary | ICD-10-CM | POA: Diagnosis not present

## 2019-02-19 DIAGNOSIS — L570 Actinic keratosis: Secondary | ICD-10-CM | POA: Diagnosis not present

## 2019-02-19 DIAGNOSIS — L814 Other melanin hyperpigmentation: Secondary | ICD-10-CM | POA: Diagnosis not present

## 2019-03-02 ENCOUNTER — Ambulatory Visit (INDEPENDENT_AMBULATORY_CARE_PROVIDER_SITE_OTHER): Payer: Medicare HMO | Admitting: Gastroenterology

## 2019-03-02 ENCOUNTER — Encounter: Payer: Self-pay | Admitting: Gastroenterology

## 2019-03-02 DIAGNOSIS — K219 Gastro-esophageal reflux disease without esophagitis: Secondary | ICD-10-CM | POA: Diagnosis not present

## 2019-03-02 DIAGNOSIS — K5900 Constipation, unspecified: Secondary | ICD-10-CM

## 2019-03-02 MED ORDER — POLYETHYLENE GLYCOL 3350 17 GM/SCOOP PO POWD: 1.0000 | Freq: Every day | ORAL | 3 refills | Status: DC

## 2019-03-02 MED ORDER — OMEPRAZOLE 20 MG PO CPDR: 20.0000 mg | DELAYED_RELEASE_CAPSULE | Freq: Two times a day (BID) | ORAL | 1 refills | Status: DC

## 2019-03-02 MED ORDER — BISACODYL EC 5 MG PO TBEC: 10.0000 mg | DELAYED_RELEASE_TABLET | Freq: Every day | ORAL | 0 refills | Status: DC | PRN

## 2019-03-02 NOTE — Progress Notes (Signed)
Alyssa Mcclure [address]  [address]  [place], [place] [address]  Main: [phone]  Fax: [phone]   Gastroenterology Consultation  Referring Provider:     [name], MD Primary Care Physician:  [name], MD Reason for Consultation:   GERD        HPI:   Virtual Visit via Video Note  I connected with patient on 03/02/19 at  1:15 PM EST by video ([email or web address]) and verified that I am speaking with the correct person using two identifiers.   I discussed the limitations, risks, security and privacy concerns of performing an evaluation and management service by video and the availability of in person appointments. I also discussed with the patient that there may be a patient responsible charge related to this service. The patient expressed understanding and agreed to proceed.  Location of the patient: Home Location of provider: Home Participating persons: Patient and provider only (Nursing staff checked in patient via phone but were not physically involved in the video interaction - see their notes)   History of Present Illness: Chief Complaint  Patient presents with  . New Patient (Initial Visit)  . Dysphagia    Patient states she will get choked sometimes. She states when she starts eating she began to cough. She states she has got choacked on anything   . Gastroesophageal Reflux    Patient states this is constant even with the medication     Alyssa Mcclure is a 69 y.o. y/o female referred for consultation & management  by Dr. Merlinda Frederick, Merleen Milliner, MD.  Patient reports chronic history of reflux.  Was previously on Protonix twice daily for years and this was working in controlling her symptoms.  However, she had oral ulcers and she states she was told by ID physician, to discontinue the medication.  Since discontinuing the medication she has noticed that the oral ulcers went away.  I reviewed previous physician notes and her oral ulcers work-up  included rheumatology and infectious disease work-up and was essentially unrevealing.  She also had evaluation by ENT for the same.  Please see previous notes.  Has been started on Pepcid twice daily which is helping, but she is having to take Tums 5 times a day for her reflux symptoms.  When she takes it it works but it is short-lived.  She denies any specific dysphagia but states when she chews and swallows her food, she sometimes has coughing.  She never has had a food impaction.  No odynophagia.  No weight loss.  Also suffers from chronic constipation.  Goes 1 week without a bowel movement.  Is not on any bowel movement regimen.  Tries to drink water throughout the day.  No family history of colon cancer.  She has previously been evaluated by [facility] for similar symptoms.  Last seen in June 2020.  At that time they recommended manometry with pH study, and it is not clear if this was done.  We do not have any documentation of such at this time.  They also ordered a barium swallow study which reported "mild GERD".  As per their notes: Summary of clinical data: -- 12/06/09: [place] abd complete (indic: abd pain, nausea, diarrhea) - Unremarkable. -- 12/28/09: HIDA scan (indic: RUQ pain) - Normal study, EF 35%. -- 01/30/10: CT A/P w/ contrast (indic: abd pain) - asymmetric soft tissue thickening of rectoanal region on left.  -- 04/19/14: EGD (indic: dysphagia) - mild Schatzki ring (dilated),  small hiatal hernia, gastritis. : CSY (indic: screening) - Grade I internal hemorrhoids; repeat in 04/2024 recommended. -- 05/20/15: BaS (indic: chronic GERD, dysphagia) - small, incompletely reducible hiatal hernia. -- 12/26/15: RUQ [place] (indic: RUQ pain) - Mild cortical thinning in kidneys. Otherwise unremarkable. -- 03/14/17: EGD (indic: epigastric pain, GERD, dysphagia) - normal esophagus, small hiatal hernia, gastritis (path: mild chronic focally active gastritis, intestinal metaplasia, and mild foveolar  hyperplasia), multiple fundic gland polyps.    Past Medical History:  Diagnosis Date  . Arthritis   . Depression   . GERD (gastroesophageal reflux disease)   . H/O Bell's palsy   . Heart murmur   . HLD (hyperlipidemia)   . HOH (hard of hearing)   . Hypertension   . Pre-diabetes     Past Surgical History:  Procedure Laterality Date  . ABDOMINAL HYSTERECTOMY    . ABDOMINOPLASTY    . BILATERAL CARPAL TUNNEL RELEASE Bilateral   . bladder lift  2011  . BROW LIFT Bilateral 06/12/2016   Procedure: BLEPHAROPLASTY UPPER EYELID WITH EXCESS SKIN;  Surgeon: Imagene Riches, MD;  Location: [facility];  Service: Ophthalmology;  Laterality: Bilateral;  . CATARACT EXTRACTION W/PHACO Right 05/09/2015   Procedure: CATARACT EXTRACTION PHACO AND INTRAOCULAR LENS PLACEMENT (IOC);  Surgeon: Sallee Lange, MD;  Location: [facility];  Service: Ophthalmology;  Laterality: Right;  [place] 45.1 AP% 21.0 CDE 19.36 Fluid Pack Lot # [id number]  . CATARACT EXTRACTION W/PHACO Left 03/28/2016   Procedure: CATARACT EXTRACTION PHACO AND INTRAOCULAR LENS PLACEMENT (IOC);  Surgeon: Sallee Lange, MD;  Location: [facility];  Service: Ophthalmology;  Laterality: Left;  [place] 01:05 AP% 21.3 CDE 24.21 fluid pack lot # [id number]  . CESAREAN SECTION    . HERNIA REPAIR    . KNEE ARTHROPLASTY Right 07/02/2018   Procedure: COMPUTER ASSISTED TOTAL KNEE ARTHROPLASTY RIGHT;  Surgeon: Donato Heinz, MD;  Location: [facility];  Service: Orthopedics;  Laterality: Right;  . KNEE ARTHROPLASTY Left 11/12/2018   Procedure: COMPUTER ASSISTED TOTAL KNEE ARTHROPLASTY;  Surgeon: Donato Heinz, MD;  Location: [facility];  Service: Orthopedics;  Laterality: Left;  . KNEE ARTHROSCOPY Bilateral   . ROTATOR CUFF REPAIR Bilateral     Prior to Admission medications   Medication Sig Start Date End Date Taking? Authorizing Provider  Digestive Enzymes (DIGESTIVE ENZYME PO) Take 1 tablet by mouth daily. With prebiotic and probiotics   Yes  [provider]  DULoxetine (CYMBALTA) 60 MG capsule Take 60 mg by mouth every morning.    Yes [provider]  losartan (COZAAR) 100 MG tablet Take 100 mg by mouth every morning.    Yes [provider]  Multiple Vitamin (MULTIVITAMIN) tablet Take 1 tablet by mouth daily.   Yes [provider]  traZODone (DESYREL) 50 MG tablet Take 50 mg by mouth at bedtime as needed for sleep.  08/02/15  Yes [provider]  bisacodyl (BISACODYL) 5 MG EC tablet Take 2 tablets (10 mg total) by mouth daily as needed for moderate constipation. 03/02/19   Pasty Spillers, MD  omeprazole (PRILOSEC) 20 MG capsule Take 1 capsule (20 mg total) by mouth 2 (two) times daily before a meal. 03/02/19   Alyssa Mcclure B, MD  polyethylene glycol powder (GLYCOLAX/MIRALAX) 17 GM/SCOOP powder Take 255 g by mouth daily. 03/02/19   Pasty Spillers, MD    Family History  Problem Relation Age of Onset  . Breast cancer Daughter 67  . Breast cancer Paternal Aunt   .  Breast cancer Cousin        2 cousins 1 mat, 1 pat  . Prostate cancer Neg Hx   . Kidney cancer Neg Hx   . Bladder Cancer Neg Hx      Social History   Tobacco Use  . Smoking status: Former Smoker    Types: Cigarettes    Quit date: 2005    Years since quitting: 16.1  . Smokeless tobacco: Never Used  . Tobacco comment: quit 10 + years  Substance Use Topics  . Alcohol use: Yes    Alcohol/week: 1.0 standard drinks    Types: 1 Glasses of wine per week    Comment: occ  . Drug use: No    Allergies as of 03/02/2019 - Review Complete 03/02/2019  Allergen Reaction Noted  . Penicillins Other (See Comments) and Hives 05/06/2015  . Adhesive [tape] Itching 06/06/2016  . Losartan Cough 12/17/2013  . Statins Other (See Comments) 06/10/2013    Review of Systems:    All systems reviewed and negative except where noted in HPI.   Observations/Objective:  Labs: CBC    Component Value Date/Time   WBC 6.5  11/07/2018 0914   RBC 4.39 11/07/2018 0914   HGB 11.4 (L) 11/07/2018 0914   HCT 36.9 11/07/2018 0914   PLT 215 11/07/2018 0914   MCV 84.1 11/07/2018 0914   MCH 26.0 11/07/2018 0914   MCHC 30.9 11/07/2018 0914   RDW 17.8 (H) 11/07/2018 0914   CMP     Component Value Date/Time   NA 139 11/07/2018 0914   K 3.9 11/07/2018 0914   CL 104 11/07/2018 0914   CO2 26 11/07/2018 0914   GLUCOSE 133 (H) 11/07/2018 0914   BUN 18 11/07/2018 0914   CREATININE 0.82 11/07/2018 0914   CALCIUM 9.1 11/07/2018 0914   PROT 6.6 11/07/2018 0914   ALBUMIN 3.7 11/07/2018 0914   AST 19 11/07/2018 0914   ALT 21 11/07/2018 0914   ALKPHOS 103 11/07/2018 0914   BILITOT 0.5 11/07/2018 0914   GFRNONAA >60 11/07/2018 0914   GFRAA >60 11/07/2018 0914    Imaging Studies: No results found.  Assessment and Plan:   Alyssa Mcclure is a 69 y.o. y/o female has been referred for GERD and constipation  Assessment and Plan: GERD was well controlled with PPI twice daily However, patient discontinued the Protonix due to oral ulcers which supposedly resolved after discontinuing Protonix  We discussed that typically oral ulcers or not a common side effect of the medication  Since H2 RA therapy is not controlling her symptoms, she is willing to try another PPI.  She understands that the same side effect can occur and if it does she will let us know.  We will start omeprazole 20 mg once daily, discontinue Pepcid  (Risks of PPI use were discussed with patient including bone loss, C. Diff diarrhea, pneumonia, infections, CKD, electrolyte abnormalities.  If clinically possible based on symptoms, goal would be to maintain patient on the lowest dose possible, or discontinue the medication with institution of acid reflux lifestyle modifications over time. Pt. Verbalizes understanding and chooses to continue the medication.)  She is not having true dysphagia but rather cough that is likely induced by reflux when she is  eating.  She has had several studies including EGDs and barium swallows.  If symptoms do not get better, upper endoscopy can be considered at that time.  No indication for urgent endoscopy at this time  Patient educated extensively on acid  reflux lifestyle modification, including buying a bed wedge, not eating 3 hrs before bedtime, diet modifications, and handout given for the same.   In regard to her constipation, high-fiber diet discussed High-fiber diet MiraLAX daily with goal of 1-2 soft bowel movements daily.  If not at goal, patient instructed to increase dose to twice daily.  If loose stools with the medication, patient asked to decrease the medication to every other day, or half dose daily.  Patient verbalized understanding  Also use Dulcolax 10 mg oral once every 3 days if no bowel movement in 3 days.  Use this only for 1 month.  If her symptoms do not get better she was asked to notify us and we can change her medications to pharmacologic constipation therapy or proceed with EGD for her upper GI symptoms   Follow Up Instructions: 6 to 8 weeks  I discussed the assessment and treatment plan with the patient. The patient was provided an opportunity to ask questions and all were answered. The patient agreed with the plan and demonstrated an understanding of the instructions.   The patient was advised to call back or seek an in-person evaluation if the symptoms worsen or if the condition fails to improve as anticipated.  I provided 18 minutes of face-to-face time via video software during this encounter.  Additional time was spent in reviewing patient's chart, placing orders etc.   Virgel Manifold, MD  Speech recognition software was used to dictate the above note.

## 2019-03-02 NOTE — Patient Instructions (Addendum)
Constipation, Adult Constipation is when a person:  Poops (has a bowel movement) fewer times in a week than normal.  Has a hard time pooping.  Has poop that is dry, hard, or bigger than normal. Follow these instructions at home: Eating and drinking   Eat foods that have a lot of fiber, such as: ? Fresh fruits and vegetables. ? Whole grains. ? Beans.  Eat less of foods that are high in fat, low in fiber, or overly processed, such as: ? French fries. ? Hamburgers. ? Cookies. ? Candy. ? Soda.  Drink enough fluid to keep your pee (urine) clear or pale yellow. General instructions  Exercise regularly or as told by your doctor.  Go to the restroom when you feel like you need to poop. Do not hold it in.  Take over-the-counter and prescription medicines only as told by your doctor. These include any fiber supplements.  Do pelvic floor retraining exercises, such as: ? Doing deep breathing while relaxing your lower belly (abdomen). ? Relaxing your pelvic floor while pooping.  Watch your condition for any changes.  Keep all follow-up visits as told by your doctor. This is important. Contact a doctor if:  You have pain that gets worse.  You have a fever.  You have not pooped for 4 days.  You throw up (vomit).  You are not hungry.  You lose weight.  You are bleeding from the anus.  You have thin, pencil-like poop (stool). Get help right away if:  You have a fever, and your symptoms suddenly get worse.  You leak poop or have blood in your poop.  Your belly feels hard or bigger than normal (is bloated).  You have very bad belly pain.  You feel dizzy or you faint. This information is not intended to replace advice given to you by your health care provider. Make sure you discuss any questions you have with your health care provider. Document Revised: 12/07/2016 Document Reviewed: 06/15/2015 Elsevier Patient Education  2020 [employer] Gastroesophageal Reflux  Disease, Adult Gastroesophageal reflux (GER) happens when acid from the stomach flows up into the tube that connects the mouth and the stomach (esophagus). Normally, food travels down the esophagus and stays in the stomach to be digested. With GER, food and stomach acid sometimes move back up into the esophagus. You may have a disease called gastroesophageal reflux disease (GERD) if the reflux:  Happens often.  Causes frequent or very bad symptoms.  Causes problems such as damage to the esophagus. When this happens, the esophagus becomes sore and swollen (inflamed). Over time, GERD can make small holes (ulcers) in the lining of the esophagus. What are the causes? This condition is caused by a problem with the muscle between the esophagus and the stomach. When this muscle is weak or not normal, it does not close properly to keep food and acid from coming back up from the stomach. The muscle can be weak because of:  Tobacco use.  Pregnancy.  Having a certain type of hernia (hiatal hernia).  Alcohol use.  Certain foods and drinks, such as coffee, chocolate, onions, and peppermint. What increases the risk? You are more likely to develop this condition if you:  Are overweight.  Have a disease that affects your connective tissue.  Use NSAID medicines. What are the signs or symptoms? Symptoms of this condition include:  Heartburn.  Difficult or painful swallowing.  The feeling of having a lump in the throat.  A bitter taste in the   mouth.  Bad breath.  Having a lot of saliva.  Having an upset or bloated stomach.  Belching.  Chest pain. Different conditions can cause chest pain. Make sure you see your doctor if you have chest pain.  Shortness of breath or noisy breathing (wheezing).  Ongoing (chronic) cough or a cough at night.  Wearing away of the surface of teeth (tooth enamel).  Weight loss. How is this treated? Treatment will depend on how bad your symptoms are.  Your doctor may suggest:  Changes to your diet.  Medicine.  Surgery. Follow these instructions at home: Eating and drinking   Follow a diet as told by your doctor. You may need to avoid foods and drinks such as: ? Coffee and tea (with or without caffeine). ? Drinks that contain alcohol. ? Energy drinks and sports drinks. ? Bubbly (carbonated) drinks or sodas. ? Chocolate and cocoa. ? Peppermint and mint flavorings. ? Garlic and onions. ? Horseradish. ? Spicy and acidic foods. These include peppers, chili powder, curry powder, vinegar, hot sauces, and BBQ sauce. ? Citrus fruit juices and citrus fruits, such as oranges, lemons, and limes. ? Tomato-based foods. These include red sauce, chili, salsa, and pizza with red sauce. ? Fried and fatty foods. These include donuts, french fries, potato chips, and high-fat dressings. ? High-fat meats. These include hot dogs, rib eye steak, sausage, ham, and bacon. ? High-fat dairy items, such as whole milk, butter, and cream cheese.  Eat small meals often. Avoid eating large meals.  Avoid drinking large amounts of liquid with your meals.  Avoid eating meals during the 2-3 hours before bedtime.  Avoid lying down right after you eat.  Do not exercise right after you eat. Lifestyle   Do not use any products that contain nicotine or tobacco. These include cigarettes, e-cigarettes, and chewing tobacco. If you need help quitting, ask your doctor.  Try to lower your stress. If you need help doing this, ask your doctor.  If you are overweight, lose an amount of weight that is healthy for you. Ask your doctor about a safe weight loss goal. General instructions  Pay attention to any changes in your symptoms.  Take over-the-counter and prescription medicines only as told by your doctor. Do not take aspirin, ibuprofen, or other NSAIDs unless your doctor says it is okay.  Wear loose clothes. Do not wear anything tight around your waist.   Raise (elevate) the head of your bed about 6 inches (15 cm).  Avoid bending over if this makes your symptoms worse.  Keep all follow-up visits as told by your doctor. This is important. Contact a doctor if:  You have new symptoms.  You lose weight and you do not know why.  You have trouble swallowing or it hurts to swallow.  You have wheezing or a cough that keeps happening.  Your symptoms do not get better with treatment.  You have a hoarse voice. Get help right away if:  You have pain in your arms, neck, jaw, teeth, or back.  You feel sweaty, dizzy, or light-headed.  You have chest pain or shortness of breath.  You throw up (vomit) and your throw-up looks like blood or coffee grounds.  You pass out (faint).  Your poop (stool) is bloody or black.  You cannot swallow, drink, or eat. Summary  If a person has gastroesophageal reflux disease (GERD), food and stomach acid move back up into the esophagus and cause symptoms or problems such as damage to the   esophagus.  Treatment will depend on how bad your symptoms are.  Follow a diet as told by your doctor.  Take all medicines only as told by your doctor. This information is not intended to replace advice given to you by your health care provider. Make sure you discuss any questions you have with your health care provider. Document Revised: 07/03/2017 Document Reviewed: 07/03/2017 Elsevier Patient Education  2020 [employer]  

## 2019-03-10 ENCOUNTER — Other Ambulatory Visit: Payer: Self-pay

## 2019-03-10 ENCOUNTER — Ambulatory Visit (INDEPENDENT_AMBULATORY_CARE_PROVIDER_SITE_OTHER): Payer: Medicare HMO | Admitting: Licensed Clinical Social Worker

## 2019-03-10 DIAGNOSIS — F330 Major depressive disorder, recurrent, mild: Secondary | ICD-10-CM

## 2019-03-10 DIAGNOSIS — F411 Generalized anxiety disorder: Secondary | ICD-10-CM
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
## 2019-03-20 DIAGNOSIS — E538 Deficiency of other specified B group vitamins: Secondary | ICD-10-CM | POA: Diagnosis not present

## 2019-03-24 ENCOUNTER — Ambulatory Visit: Payer: Medicare HMO | Admitting: Gastroenterology

## 2019-04-13 ENCOUNTER — Ambulatory Visit (INDEPENDENT_AMBULATORY_CARE_PROVIDER_SITE_OTHER): Payer: Medicare HMO | Admitting: Licensed Clinical Social Worker

## 2019-04-13 ENCOUNTER — Other Ambulatory Visit: Payer: Self-pay

## 2019-04-13 DIAGNOSIS — F411 Generalized anxiety disorder: Secondary | ICD-10-CM

## 2019-04-13 DIAGNOSIS — F330 Major depressive disorder, recurrent, mild: Secondary | ICD-10-CM

## 2019-04-16 NOTE — Progress Notes (Signed)
Virtual Visit via Video Note  I connected with Alyssa Mcclure on 04/16/19 at  9:00 AM EDT by a video enabled telemedicine application and verified that I am speaking with the correct person using two identifiers.   The patient was advised to call back or seek an in-person evaluation if the symptoms worsen or if the condition fails to improve as anticipated.  I provided 45 minutes of non-face-to-face time during this encounter.   Francine Graven, LCSW      THERAPIST PROGRESS NOTE  Session Time: 9am-9:45am  Participation Level: Active  Behavioral Response: Well GroomedAlertDepressed  Type of Therapy: Individual Therapy  Treatment Goals addressed: Anxiety and Coping  Interventions: CBT, Motivational Interviewing and Supportive  Summary: Client presented alert and oriented x5 for today's session. Client checked in and provided an update reporting that since last session, her dog passed away and she has experienced increased depression. Client reported that her family has been supportive of her however she struggles with frequent triggers throughout her home reminding her of her dog. Client processed feelings and discussed plans for the future which bring her hope and happiness. Client was receptive to feedback and asked for any skills that would be helpful for managing these intense emotions. Client agreed to review a Felicity Pellegrini Talk that will be sent to her and utilize skills taught throughout the video. Client will follow up in 3 weeks and will discuss thoughts, feelings related to this video. Client denied SI/HI/psychosis.  Suicidal/Homicidal: Nowithout intent/plan  Therapist Response: Clinician joined client for scheduled session and facilitated check in assessing for recent stressors, changes in mood, and SI/HI/psychosis. Clinician validated client's feelings and provided space for processing the loss of her dog. Clinician utilized open ended questions and summarizations to assess for any  coping skills utilized. Clinician and client discussed grief and loss and the cycles of feelings felt after experiencing grief. Clinician discussed psycho-educational video Felicity Pellegrini Talk) that would be helpful for client and agreed to send it after this session.   Plan: Return again in 3 weeks.  Diagnosis: Axis I: GAD, MDD      Francine Graven, LCSW 04/16/2019

## 2019-04-17 ENCOUNTER — Other Ambulatory Visit: Payer: Self-pay

## 2019-04-20 ENCOUNTER — Encounter: Payer: Self-pay | Admitting: Gastroenterology

## 2019-04-21 ENCOUNTER — Other Ambulatory Visit: Payer: Self-pay

## 2019-04-21 ENCOUNTER — Ambulatory Visit (INDEPENDENT_AMBULATORY_CARE_PROVIDER_SITE_OTHER): Payer: Medicare HMO | Admitting: Gastroenterology

## 2019-04-21 ENCOUNTER — Encounter: Payer: Self-pay | Admitting: Gastroenterology

## 2019-04-21 DIAGNOSIS — K5900 Constipation, unspecified: Secondary | ICD-10-CM | POA: Diagnosis not present

## 2019-04-21 DIAGNOSIS — K3189 Other diseases of stomach and duodenum: Secondary | ICD-10-CM

## 2019-04-21 DIAGNOSIS — K219 Gastro-esophageal reflux disease without esophagitis: Secondary | ICD-10-CM

## 2019-04-21 DIAGNOSIS — K31A0 Gastric intestinal metaplasia, unspecified: Secondary | ICD-10-CM

## 2019-04-21 DIAGNOSIS — E538 Deficiency of other specified B group vitamins: Secondary | ICD-10-CM | POA: Diagnosis not present

## 2019-04-21 MED ORDER — METAMUCIL 4 IN 1 FIBER 51.7 % PO PACK: 1.0000 | PACK | Freq: Every day | ORAL | 0 refills | Status: DC

## 2019-04-21 NOTE — Progress Notes (Signed)
Alyssa Bouillon, MD [address]  [address]  [place], [place] [address]  Main: [phone]  Fax: [phone]   Primary Care Physician: [name], MD  Virtual Visit via Telephone Note  I connected with patient on 04/21/19 at 10:00 AM EDT by telephone and verified that I am speaking with the correct person using two identifiers.   I discussed the limitations, risks, security and privacy concerns of performing an evaluation and management service by telephone and the availability of in person appointments. I also discussed with the patient that there may be a patient responsible charge related to this service. The patient expressed understanding and agreed to proceed.  Location of Patient: Home Location of Provider: Home Persons involved: Patient and provider only during the visit (nursing staff and front desk staff was involved in communicating with the patient prior to the appointment, reviewing medications and checking them in)   History of Present Illness: CC: Reflux  HPI: Alyssa Mcclure is a 69 y.o. female here for follow up of reflux and constipation. She was started on Omeprazole on last visit with which her symptoms have improved. No dysphagia. Still reporting constipation, but not taking MiraLAX consistently.  She states that this has not worked well in the past for her.  Is having to take Dulcolax 3 days in a row before she has a bowel movement.  Previous visit: Patient reports chronic history of reflux.  Was previously on Protonix twice daily for years and this was working in controlling her symptoms.  However, she had oral ulcers and she states she was told by ID physician, to discontinue the medication.  Since discontinuing the medication she has noticed that the oral ulcers went away.  I reviewed previous physician notes and her oral ulcers work-up included rheumatology and infectious disease work-up and was essentially unrevealing.  She also had  evaluation by ENT for the same.  Please see previous notes.  Also suffers from chronic constipation.  Goes 1 week without a bowel movement.  Is not on any bowel movement regimen.  Tries to drink water throughout the day.  No family history of colon cancer.  She has previously been evaluated by [facility] for similar symptoms.  Last seen in June 2020.  At that time they recommended manometry with pH study, and it is not clear if this was done.  We do not have any documentation of such at this time.  They also ordered a barium swallow study which reported "mild GERD".  As per their notes: Summary of clinical data: -- 12/06/09: [place] abd complete (indic: abd pain, nausea, diarrhea) - Unremarkable. -- 12/28/09: HIDA scan (indic: RUQ pain) - Normal study, EF 35%. -- 01/30/10: CT A/P w/ contrast (indic: abd pain) - asymmetric soft tissue thickening of rectoanal region on left.  -- 04/19/14: EGD (indic: dysphagia) - mild Schatzki ring (dilated), small hiatal hernia, gastritis. : CSY (indic: screening) - Grade I internal hemorrhoids; repeat in 04/2024 recommended. -- 05/20/15: BaS (indic: chronic GERD, dysphagia) - small, incompletely reducible hiatal hernia. -- 12/26/15: RUQ [place] (indic: RUQ pain) - Mild cortical thinning in kidneys. Otherwise unremarkable. -- 03/14/17: EGD (indic: epigastric pain, GERD, dysphagia) - normal esophagus, small hiatal hernia, gastritis (path: mild chronic focally active gastritis, intestinal metaplasia, and mild foveolar hyperplasia), multiple fundic gland polyps.   Current Outpatient Medications  Medication Sig Dispense Refill  . bisacodyl (BISACODYL) 5 MG EC tablet Take 2 tablets (10 mg total) by mouth daily as  needed for moderate constipation. 30 tablet 0  . Digestive Enzymes (DIGESTIVE ENZYME PO) Take 1 tablet by mouth daily. With prebiotic and probiotics    . DULoxetine (CYMBALTA) 60 MG capsule Take 60 mg by mouth every morning.     Marland Kitchen losartan (COZAAR) 100 MG tablet  Take 100 mg by mouth every morning.     Marland Kitchen omeprazole (PRILOSEC) 20 MG capsule Take 1 capsule (20 mg total) by mouth 2 (two) times daily before a meal. 30 capsule 1  . polyethylene glycol powder (GLYCOLAX/MIRALAX) 17 GM/SCOOP powder Take 255 g by mouth daily. 255 g 3  . Probiotic Product (PROBIOTIC-10 PO) Take by mouth in the morning and at bedtime.    . traZODone (DESYREL) 50 MG tablet Take 50 mg by mouth at bedtime as needed for sleep.      No current facility-administered medications for this visit.    Allergies as of 04/21/2019 - Review Complete 04/21/2019  Allergen Reaction Noted  . Penicillins Other (See Comments) and Hives 05/06/2015  . Adhesive [tape] Itching 06/06/2016  . Losartan Cough 12/17/2013  . Statins Other (See Comments) 06/10/2013    Review of Systems:    All systems reviewed and negative except where noted in HPI.   Observations/Objective:  Labs: CMP     Component Value Date/Time   NA 139 11/07/2018 0914   K 3.9 11/07/2018 0914   CL 104 11/07/2018 0914   CO2 26 11/07/2018 0914   GLUCOSE 133 (H) 11/07/2018 0914   BUN 18 11/07/2018 0914   CREATININE 0.82 11/07/2018 0914   CALCIUM 9.1 11/07/2018 0914   PROT 6.6 11/07/2018 0914   ALBUMIN 3.7 11/07/2018 0914   AST 19 11/07/2018 0914   ALT 21 11/07/2018 0914   ALKPHOS 103 11/07/2018 0914   BILITOT 0.5 11/07/2018 0914   GFRNONAA >60 11/07/2018 0914   GFRAA >60 11/07/2018 0914   Lab Results  Component Value Date   WBC 6.5 11/07/2018   HGB 11.4 (L) 11/07/2018   HCT 36.9 11/07/2018   MCV 84.1 11/07/2018   PLT 215 11/07/2018    Imaging Studies: No results found.  Assessment and Plan:   Alyssa Mcclure is a 69 y.o. y/o female here for follow-up of reflux and constipation  Assessment and Plan: Reflux symptoms now well controlled with omeprazole  Previous upper endoscopy in 2019 showed intestinal metaplasia patient needs mapping biopsies  I have discussed alternative options, risks & benefits,  which  include, but are not limited to, bleeding, infection, perforation,respiratory complication & drug reaction.  The patient agrees with this plan & written consent will be obtained.    She is willing to try Metamucil for constipation.  If this does not help, she was asked to notify us within 2 weeks and then we can change to pharmacologic therapy for constipation  Follow Up Instructions:    I discussed the assessment and treatment plan with the patient. The patient was provided an opportunity to ask questions and all were answered. The patient agreed with the plan and demonstrated an understanding of the instructions.   The patient was advised to call back or seek an in-person evaluation if the symptoms worsen or if the condition fails to improve as anticipated.  I provided 15 minutes of non-face-to-face time during this encounter. Additional time was spent in reviewing patient's chart, placing orders etc.   Virgel Manifold, MD  Speech recognition software was used to dictate this note.

## 2019-04-27 DIAGNOSIS — H524 Presbyopia: Secondary | ICD-10-CM | POA: Diagnosis not present

## 2019-05-04 ENCOUNTER — Other Ambulatory Visit: Payer: Self-pay

## 2019-05-04 ENCOUNTER — Ambulatory Visit (INDEPENDENT_AMBULATORY_CARE_PROVIDER_SITE_OTHER): Payer: Medicare HMO | Admitting: Licensed Clinical Social Worker

## 2019-05-04 DIAGNOSIS — F330 Major depressive disorder, recurrent, mild: Secondary | ICD-10-CM

## 2019-05-04 DIAGNOSIS — F411 Generalized anxiety disorder: Secondary | ICD-10-CM

## 2019-05-04 NOTE — Progress Notes (Signed)
Virtual Visit via Video Note  I connected with Alyssa Mcclure on 05/04/19 at  8:00 AM EDT by a video enabled telemedicine application and verified that I am speaking with the correct person using two identifiers.  The patient was advised to call back or seek an in-person evaluation if the symptoms worsen or if the condition fails to improve as anticipated.  I provided 45 minutes of non-face-to-face time during this encounter.   Francine Graven, LCSW    THERAPIST PROGRESS NOTE  Session Time: 8:05am-8:50am  Participation Level: Active  Behavioral Response: NeatAlertEuthymic  Type of Therapy: Individual Therapy  Treatment Goals addressed: Anxiety and Coping  Interventions: Motivational Interviewing, Solution Focused and Supportive  Summary: Client presented alert and oriented for scheduled session.Client checked in and reported "things are getting better". Client elaborated by discussing various changes since we last spoke such as her agreeing to work part time for a [employer], joining a painting class, and continuing to engage in church. Client reported that she still continues to struggle with the loss of her pet and processed feelings related to grief/loss as well as comfort that she finds in helping take care of a neighbor's puppy. Client identified means in which she handles anxiety when it comes up and reported that overall, she has felt better since being able to socialize more. Client reported she plans to continue living with structure and keeping herself busy as she finds it helpful. Client denied SI/HI.  Suicidal/Homicidal: Nowithout intent/plan  Therapist Response: Clinician joined client for session and facilitated check in assessing for recent stressors, changes in mood, SI/HI. Clinician engaged in discussion on various activities and responsibilities client has taken on in attempt to remain busy. Clinician assessed client's ability to take time for self-care and engaged  in discussion on client's self-awareness regarding knowing personal limits in overextending herself. Clinician validated client's feelings throughout session and utilized reflective listening, summarizations, open ended questions, and affirmations while discussing changes client has implemented and her desire to continue them.  Plan: Return again in 3-4 weeks.  Diagnosis: Axis I: GAD, MDD       Francine Graven, LCSW 05/04/2019

## 2019-05-06 ENCOUNTER — Telehealth: Payer: Self-pay

## 2019-05-06 MED ORDER — OMEPRAZOLE 20 MG PO CPDR: 20.0000 mg | DELAYED_RELEASE_CAPSULE | Freq: Two times a day (BID) | ORAL | 1 refills | Status: AC

## 2019-05-06 NOTE — Telephone Encounter (Signed)
OMEPRAZOLE 20 MG pt request refill. Uses [facility] [address] Pt ph [phone].

## 2019-05-06 NOTE — Telephone Encounter (Signed)
Called patient to let her know that Dr. Maximino Greenland was okay to refill her Omeprazole 20 MG. Patient was instructed to take Omeprazole 20 MG 1 capsule twice a day for 30 days and then 1 capsule once a day for 30 days. Patient understood her instructions. This was sent to her [facility] pharmacy on [address]

## 2019-05-06 NOTE — Telephone Encounter (Signed)
Are you okay me sending her a refill on her Omeprazole 20 MG BID? How many tabs and refills? She was seen last on 04/21/2019 with you.

## 2019-05-13 ENCOUNTER — Encounter: Payer: Self-pay | Admitting: Gastroenterology

## 2019-05-13 ENCOUNTER — Other Ambulatory Visit: Payer: Self-pay

## 2019-05-20 ENCOUNTER — Other Ambulatory Visit
Admission: RE | Admit: 2019-05-20 | Discharge: 2019-05-20 | Disposition: A | Discharge status: Home / Self Care | Payer: Medicare HMO | Source: Ambulatory Visit | Attending: Gastroenterology | Admitting: Gastroenterology

## 2019-05-20 ENCOUNTER — Other Ambulatory Visit: Payer: Self-pay

## 2019-05-20 DIAGNOSIS — Z20822 Contact with and (suspected) exposure to covid-19: Secondary | ICD-10-CM | POA: Insufficient documentation

## 2019-05-20 DIAGNOSIS — Z01812 Encounter for preprocedural laboratory examination: Secondary | ICD-10-CM | POA: Insufficient documentation

## 2019-05-20 LAB — SARS CORONAVIRUS 2 (TAT 6-24 HRS): SARS Coronavirus 2: NEGATIVE

## 2019-05-20 NOTE — Discharge Instructions (Signed)
General Anesthesia, Adult, Care After This sheet gives you information about how to care for yourself after your procedure. Your health care provider may also give you more specific instructions. If you have problems or questions, contact your health care provider. What can I expect after the procedure? After the procedure, the following side effects are common:  Pain or discomfort at the IV site.  Nausea.  Vomiting.  Sore throat.  Trouble concentrating.  Feeling cold or chills.  Weak or tired.  Sleepiness and fatigue.  Soreness and body aches. These side effects can affect parts of the body that were not involved in surgery. Follow these instructions at home:  For at least 24 hours after the procedure:  Have a responsible adult stay with you. It is important to have someone help care for you until you are awake and alert.  Rest as needed.  Do not: ? Participate in activities in which you could fall or become injured. ? Drive. ? Use heavy machinery. ? Drink alcohol. ? Take sleeping pills or medicines that cause drowsiness. ? Make important decisions or sign legal documents. ? Take care of children on your own. Eating and drinking  Follow any instructions from your health care provider about eating or drinking restrictions.  When you feel hungry, start by eating small amounts of foods that are soft and easy to digest (bland), such as toast. Gradually return to your regular diet.  Drink enough fluid to keep your urine pale yellow.  If you vomit, rehydrate by drinking water, juice, or clear broth. General instructions  If you have sleep apnea, surgery and certain medicines can increase your risk for breathing problems. Follow instructions from your health care provider about wearing your sleep device: ? Anytime you are sleeping, including during daytime naps. ? While taking prescription pain medicines, sleeping medicines, or medicines that make you drowsy.  Return to  your normal activities as told by your health care provider. Ask your health care provider what activities are safe for you.  Take over-the-counter and prescription medicines only as told by your health care provider.  If you smoke, do not smoke without supervision.  Keep all follow-up visits as told by your health care provider. This is important. Contact a health care provider if:  You have nausea or vomiting that does not get better with medicine.  You cannot eat or drink without vomiting.  You have pain that does not get better with medicine.  You are unable to pass urine.  You develop a skin rash.  You have a fever.  You have redness around your IV site that gets worse. Get help right away if:  You have difficulty breathing.  You have chest pain.  You have blood in your urine or stool, or you vomit blood. Summary  After the procedure, it is common to have a sore throat or nausea. It is also common to feel tired.  Have a responsible adult stay with you for the first 24 hours after general anesthesia. It is important to have someone help care for you until you are awake and alert.  When you feel hungry, start by eating small amounts of foods that are soft and easy to digest (bland), such as toast. Gradually return to your regular diet.  Drink enough fluid to keep your urine pale yellow.  Return to your normal activities as told by your health care provider. Ask your health care provider what activities are safe for you. This information is not   intended to replace advice given to you by your health care provider. Make sure you discuss any questions you have with your health care provider. Document Revised: 12/28/2016 Document Reviewed: 08/10/2016 Elsevier Patient Education  2020 Elsevier Inc[employer]  

## 2019-05-21 NOTE — Anesthesia Preprocedure Evaluation (Addendum)
Anesthesia Evaluation  Patient identified by MRN, date of birth, ID band Patient awake    Reviewed: Allergy & Precautions, NPO status , Patient's Chart, lab work & pertinent test results  History of Anesthesia Complications Negative for: history of anesthetic complications  Airway Mallampati: III   Neck ROM: Full    Dental no notable dental hx.    Pulmonary sleep apnea , former smoker (quit 2005),    Pulmonary exam normal breath sounds clear to auscultation       Cardiovascular hypertension, + Valvular Problems/Murmurs (murmur)  Rhythm:Regular Rate:Normal + Systolic murmurs Echo 06/09/18:  NORMAL RIGHT VENTRICULAR SYSTOLIC FUNCTION MILD VALVULAR REGURGITATION NO VALVULAR STENOSIS NOTED Mild stress induced changes likely indicating ischemia **See cardiology note below   Neuro/Psych PSYCHIATRIC DISORDERS Anxiety Depression negative neurological ROS     GI/Hepatic GERD  ,  Endo/Other  Prediabetes   Renal/GU negative Renal ROS     Musculoskeletal  (+) Arthritis ,   Abdominal   Peds  Hematology negative hematology ROS (+)   Anesthesia Other Findings Cardiology note 06/20/18:  Preop cardiovascular evaluation Details below, no further cardiac testing needed at this time She does not have anginal symptoms, her subtle chest discomfort at rest is atypical.  Stress test EKG read as normal.  . Recommend she proceed with her total knee replacement surgery with Dr. Ernest Pine  Reproductive/Obstetrics                            Anesthesia Physical Anesthesia Plan  ASA: II  Anesthesia Plan: General   Post-op Pain Management:    Induction: Intravenous  PONV Risk Score and Plan: 2 and Propofol infusion, TIVA and Treatment may vary due to age or medical condition  Airway Management Planned: Natural Airway  Additional Equipment:   Intra-op Plan:   Post-operative Plan:   Informed Consent: I have  reviewed the patients History and Physical, chart, labs and discussed the procedure including the risks, benefits and alternatives for the proposed anesthesia with the patient or authorized representative who has indicated his/her understanding and acceptance.       Plan Discussed with: CRNA  Anesthesia Plan Comments:        Anesthesia Quick Evaluation

## 2019-05-22 ENCOUNTER — Ambulatory Visit: Payer: Medicare HMO | Admitting: Anesthesiology

## 2019-05-22 ENCOUNTER — Encounter: Payer: Self-pay | Admitting: Gastroenterology

## 2019-05-22 ENCOUNTER — Other Ambulatory Visit: Payer: Self-pay

## 2019-05-22 ENCOUNTER — Ambulatory Visit
Admission: RE | Admit: 2019-05-22 | Discharge: 2019-05-22 | Disposition: A | Discharge status: Home / Self Care | Payer: Medicare HMO | Attending: Gastroenterology | Admitting: Gastroenterology

## 2019-05-22 ENCOUNTER — Encounter
Admission: RE | Disposition: A | Discharge status: Home / Self Care | Payer: Self-pay | Source: Home / Self Care | Attending: Gastroenterology

## 2019-05-22 DIAGNOSIS — R1310 Dysphagia, unspecified: Secondary | ICD-10-CM

## 2019-05-22 DIAGNOSIS — K228 Other specified diseases of esophagus: Secondary | ICD-10-CM | POA: Diagnosis not present

## 2019-05-22 DIAGNOSIS — K317 Polyp of stomach and duodenum: Secondary | ICD-10-CM | POA: Diagnosis not present

## 2019-05-22 DIAGNOSIS — K3189 Other diseases of stomach and duodenum: Secondary | ICD-10-CM | POA: Diagnosis not present

## 2019-05-22 DIAGNOSIS — F419 Anxiety disorder, unspecified: Secondary | ICD-10-CM | POA: Insufficient documentation

## 2019-05-22 DIAGNOSIS — I10 Essential (primary) hypertension: Secondary | ICD-10-CM | POA: Insufficient documentation

## 2019-05-22 DIAGNOSIS — K219 Gastro-esophageal reflux disease without esophagitis: Secondary | ICD-10-CM | POA: Diagnosis not present

## 2019-05-22 DIAGNOSIS — H9190 Unspecified hearing loss, unspecified ear: Secondary | ICD-10-CM | POA: Diagnosis not present

## 2019-05-22 DIAGNOSIS — F329 Major depressive disorder, single episode, unspecified: Secondary | ICD-10-CM | POA: Insufficient documentation

## 2019-05-22 DIAGNOSIS — Z8719 Personal history of other diseases of the digestive system: Secondary | ICD-10-CM | POA: Insufficient documentation

## 2019-05-22 DIAGNOSIS — Z87891 Personal history of nicotine dependence: Secondary | ICD-10-CM | POA: Diagnosis not present

## 2019-05-22 DIAGNOSIS — Z96653 Presence of artificial knee joint, bilateral: Secondary | ICD-10-CM | POA: Diagnosis not present

## 2019-05-22 DIAGNOSIS — G4730 Sleep apnea, unspecified: Secondary | ICD-10-CM | POA: Insufficient documentation

## 2019-05-22 DIAGNOSIS — K2950 Unspecified chronic gastritis without bleeding: Secondary | ICD-10-CM | POA: Insufficient documentation

## 2019-05-22 DIAGNOSIS — Z79899 Other long term (current) drug therapy: Secondary | ICD-10-CM | POA: Insufficient documentation

## 2019-05-22 DIAGNOSIS — R1319 Other dysphagia: Secondary | ICD-10-CM | POA: Diagnosis not present

## 2019-05-22 HISTORY — DX: Sleep apnea, unspecified: G47.30

## 2019-05-22 HISTORY — PX: ESOPHAGOGASTRODUODENOSCOPY (EGD) WITH PROPOFOL: SHX5813

## 2019-05-22 HISTORY — DX: Deficiency of other specified B group vitamins: E53.8

## 2019-05-22 HISTORY — DX: Nontoxic multinodular goiter: E04.2

## 2019-05-22 SURGERY — ESOPHAGOGASTRODUODENOSCOPY (EGD) WITH PROPOFOL
Anesthesia: General | Site: Mouth

## 2019-05-22 MED ORDER — ACETAMINOPHEN 325 MG PO TABS: 650.0000 mg | ORAL_TABLET | Freq: Once | ORAL | Status: DC | PRN

## 2019-05-22 MED ORDER — LIDOCAINE HCL (CARDIAC) PF 100 MG/5ML IV SOSY
PREFILLED_SYRINGE | INTRAVENOUS | Status: DC | PRN
  Administered 2019-05-22: 50 mg via INTRAVENOUS

## 2019-05-22 MED ORDER — LACTATED RINGERS IV SOLN
10.0000 mL/h | INTRAVENOUS | Status: DC
  Administered 2019-05-22: 10 mL/h via INTRAVENOUS

## 2019-05-22 MED ORDER — ONDANSETRON HCL 4 MG/2ML IJ SOLN: 4.0000 mg | Freq: Once | INTRAMUSCULAR | Status: DC | PRN

## 2019-05-22 MED ORDER — ACETAMINOPHEN 160 MG/5ML PO SOLN: 325.0000 mg | ORAL | Status: DC | PRN

## 2019-05-22 MED ORDER — STERILE WATER FOR IRRIGATION IR SOLN
Status: DC | PRN
  Administered 2019-05-22: 25 mL

## 2019-05-22 MED ORDER — GLYCOPYRROLATE 0.2 MG/ML IJ SOLN
INTRAMUSCULAR | Status: DC | PRN
  Administered 2019-05-22: .2 mg via INTRAVENOUS

## 2019-05-22 MED ORDER — PROPOFOL 10 MG/ML IV BOLUS
INTRAVENOUS | Status: DC | PRN
  Administered 2019-05-22 (×3): 50 mg via INTRAVENOUS
  Administered 2019-05-22: 100 mg via INTRAVENOUS

## 2019-05-22 SURGICAL SUPPLY — 6 items
BLOCK BITE 60FR ADLT L/F GRN (MISCELLANEOUS) ×2 IMPLANT
FORCEPS BIOP RAD 4 LRG CAP 4 (CUTTING FORCEPS) ×2 IMPLANT
GOWN CVR UNV OPN BCK APRN NK (MISCELLANEOUS) ×2 IMPLANT
GOWN ISOL THUMB LOOP REG UNIV (MISCELLANEOUS) ×2
KIT ENDO PROCEDURE OLY (KITS) ×2 IMPLANT
WATER STERILE IRR 250ML POUR (IV SOLUTION) ×2 IMPLANT

## 2019-05-22 NOTE — Anesthesia Postprocedure Evaluation (Signed)
Anesthesia Post Note  Patient: Alyssa Mcclure  Procedure(s) Performed: ESOPHAGOGASTRODUODENOSCOPY (EGD) WITH BIOPSY (N/A Mouth)     Patient location during evaluation: PACU Anesthesia Type: General Level of consciousness: awake and alert, oriented and patient cooperative Pain management: pain level controlled Vital Signs Assessment: post-procedure vital signs reviewed and stable Respiratory status: spontaneous breathing, nonlabored ventilation and respiratory function stable Cardiovascular status: blood pressure returned to baseline and stable Postop Assessment: adequate PO intake Anesthetic complications: no    Reed Breech

## 2019-05-22 NOTE — H&P (Signed)
[name], MD [address], [address], [place], [place], [address] [address], [address], [place], [place], [address] Phone: [phone]  Fax: [phone]  Primary Care Physician:  [name], MD   Pre-Procedure History & Physical: HPI:  Alyssa Mcclure is a 70 y.o. female is here for an EGD.   Past Medical History:  Diagnosis Date  . Arthritis    neck and back and joints  . B12 deficiency   . Depression   . GERD (gastroesophageal reflux disease)   . H/O Bell's palsy   . Heart murmur    causes no issues  . HLD (hyperlipidemia)   . HOH (hard of hearing)    bilateral  . Hypertension    controlled on meds  . Multiple thyroid nodules   . Pre-diabetes   . Sleep apnea    mild case per Dr.    Past Surgical History:  Procedure Laterality Date  . ABDOMINAL HYSTERECTOMY    . ABDOMINOPLASTY    . BILATERAL CARPAL TUNNEL RELEASE Bilateral   . bladder lift  2011  . BROW LIFT Bilateral 06/12/2016   Procedure: BLEPHAROPLASTY UPPER EYELID WITH EXCESS SKIN;  Surgeon: Imagene Riches, MD;  Location: [facility];  Service: Ophthalmology;  Laterality: Bilateral;  . CATARACT EXTRACTION W/PHACO Right 05/09/2015   Procedure: CATARACT EXTRACTION PHACO AND INTRAOCULAR LENS PLACEMENT (IOC);  Surgeon: Sallee Lange, MD;  Location: [facility];  Service: Ophthalmology;  Laterality: Right;  [place] 45.1 AP% 21.0 CDE 19.36 Fluid Pack Lot # [id number]  . CATARACT EXTRACTION W/PHACO Left 03/28/2016   Procedure: CATARACT EXTRACTION PHACO AND INTRAOCULAR LENS PLACEMENT (IOC);  Surgeon: Sallee Lange, MD;  Location: [facility];  Service: Ophthalmology;  Laterality: Left;  [place] 01:05 AP% 21.3 CDE 24.21 fluid pack lot # [id number]  . CESAREAN SECTION    . HERNIA REPAIR    . KNEE ARTHROPLASTY Right 07/02/2018   Procedure: COMPUTER ASSISTED TOTAL KNEE ARTHROPLASTY RIGHT;  Surgeon: Donato Heinz, MD;  Location: [facility];  Service: Orthopedics;  Laterality: Right;  . KNEE ARTHROPLASTY  Left 11/12/2018   Procedure: COMPUTER ASSISTED TOTAL KNEE ARTHROPLASTY;  Surgeon: Donato Heinz, MD;  Location: [facility];  Service: Orthopedics;  Laterality: Left;  . KNEE ARTHROSCOPY Bilateral   . ROTATOR CUFF REPAIR Bilateral     Prior to Admission medications   Medication Sig Start Date End Date Taking? Authorizing Provider  DULoxetine (CYMBALTA) 60 MG capsule Take 60 mg by mouth every morning.    Yes [provider]  losartan (COZAAR) 100 MG tablet Take 100 mg by mouth every morning.    Yes [provider]  omeprazole (PRILOSEC) 20 MG capsule Take 1 capsule (20 mg total) by mouth 2 (two) times daily before a meal. Take 1 capsule (20 MG total) by mouth two times a day for 30 days and then take 1 capsule (20 MG total) once a day for 30 days. 05/06/19  Yes Pasty Spillers, MD  Probiotic Product (PROBIOTIC-10 PO) Take by mouth in the morning and at bedtime.   Yes [provider]  Psyllium (METAMUCIL FIBER) 51.7 % PACK Take 1 packet by mouth daily. 04/21/19  Yes Pasty Spillers, MD  traZODone (DESYREL) 50 MG tablet Take 25 mg by mouth at bedtime as needed for sleep.  08/02/15  Yes [provider]  bisacodyl (BISACODYL) 5 MG EC tablet Take 2 tablets (10 mg total) by mouth daily as needed for moderate constipation. Patient not taking: Reported on  05/13/2019 03/02/19   Pasty Spillers, MD  Digestive Enzymes (DIGESTIVE ENZYME PO) Take 1 tablet by mouth daily. With prebiotic and probiotics    [provider]    Allergies as of 04/21/2019 - Review Complete 04/21/2019  Allergen Reaction Noted  . Penicillins Other (See Comments) and Hives 05/06/2015  . Adhesive [tape] Itching 06/06/2016  . Losartan Cough 12/17/2013  . Statins Other (See Comments) 06/10/2013    Family History  Problem Relation Age of Onset  . Breast cancer Daughter 14  . Breast cancer Paternal Aunt   . Breast cancer Cousin        2 cousins 1 mat, 1 pat  . Prostate cancer  Neg Hx   . Kidney cancer Neg Hx   . Bladder Cancer Neg Hx     Social History   Socioeconomic History  . Marital status: Divorced    Spouse name: Not on file  . Number of children: Not on file  . Years of education: Not on file  . Highest education level: Not on file  Occupational History  . Not on file  Tobacco Use  . Smoking status: Former Smoker    Packs/day: 0.50    Years: 30.00    Pack years: 15.00    Types: Cigarettes    Quit date: 2005    Years since quitting: 16.3  . Smokeless tobacco: Never Used  . Tobacco comment: quit 10 + years  Substance and Sexual Activity  . Alcohol use: Yes    Alcohol/week: 1.0 standard drinks    Types: 1 Glasses of wine per week    Comment: occ  . Drug use: No  . Sexual activity: Not on file  Other Topics Concern  . Not on file  Social History Narrative  . Not on file   Social Determinants of Health   Financial Resource Strain:   . Difficulty of Paying Living Expenses:   Food Insecurity:   . Worried About Programme researcher, broadcasting/film/video in the Last Year:   . Barista in the Last Year:   Transportation Needs:   . Freight forwarder (Medical):   Marland Kitchen Lack of Transportation (Non-Medical):   Physical Activity:   . Days of Exercise per Week:   . Minutes of Exercise per Session:   Stress:   . Feeling of Stress :   Social Connections:   . Frequency of Communication with Friends and Family:   . Frequency of Social Gatherings with Friends and Family:   . Attends Religious Services:   . Active Member of Clubs or Organizations:   . Attends Banker Meetings:   Marland Kitchen Marital Status:   Intimate Partner Violence:   . Fear of Current or Ex-Partner:   . Emotionally Abused:   Marland Kitchen Physically Abused:   . Sexually Abused:     Review of Systems: See HPI, otherwise negative ROS  Physical Exam: BP 136/60   Pulse 72   Temp (!) 97.2 F (36.2 C) (Temporal)   Resp 15   Ht 5' 4.5" (1.638 m)   Wt 76.2 kg   SpO2 97%   BMI 28.39 kg/m    General:   Alert,  pleasant and cooperative in NAD Head:  Normocephalic and atraumatic. Neck:  Supple; no masses or thyromegaly. Lungs:  Clear throughout to auscultation, normal respiratory effort.    Heart:  +S1, +S2, Regular rate and rhythm, No edema. Abdomen:  Soft, nontender and nondistended. Normal bowel sounds, without guarding, and without  rebound.   Neurologic:  Alert and  oriented x4;  grossly normal neurologically.  Impression/Plan: Leanord Hawking is here for an EGD for gastric intestinal metaplasia  Risks, benefits, limitations, and alternatives regarding the procedure have been reviewed with the patient.  Questions have been answered.  All parties agreeable.   Virgel Manifold, MD  05/22/2019, 8:22 AM

## 2019-05-22 NOTE — Transfer of Care (Signed)
Immediate Anesthesia Transfer of Care Note  Patient: Alyssa Mcclure  Procedure(s) Performed: ESOPHAGOGASTRODUODENOSCOPY (EGD) WITH BIOPSY (N/A Mouth)  Patient Location: PACU  Anesthesia Type: General  Level of Consciousness: awake, alert  and patient cooperative  Airway and Oxygen Therapy: Patient Spontanous Breathing and Patient connected to supplemental oxygen  Post-op Assessment: Post-op Vital signs reviewed, Patient's Cardiovascular Status Stable, Respiratory Function Stable, Patent Airway and No signs of Nausea or vomiting  Post-op Vital Signs: Reviewed and stable  Complications: No apparent anesthesia complications

## 2019-05-22 NOTE — Op Note (Addendum)
[facility] Patient Name: Alyssa Mcclure Procedure Date: 05/22/2019 8:30 AM MRN: [id number] Account #: [id number] Date of Birth: [birth year] Admit Type: Outpatient Age: 69 Room: [facility] OR ROOM 01 Gender: Female Note Status: Finalized Procedure:             Upper GI endoscopy Indications:           Dysphagia, Follow-up of intestinal metaplasia Providers:             Lavelle Akel B. Maximino Greenland MD, MD Referring MD:          Marilynne Halsted, MD (Referring MD) Medicines:             Monitored Anesthesia Care Complications:         No immediate complications. Procedure:             Pre-Anesthesia Assessment:                        - Prior to the procedure, a History and Physical was                         performed, and patient medications, allergies and                         sensitivities were reviewed. The patient's tolerance                         of previous anesthesia was reviewed.                        - The risks and benefits of the procedure and the                         sedation options and risks were discussed with the                         patient. All questions were answered and informed                         consent was obtained.                        - Patient identification and proposed procedure were                         verified prior to the procedure by the physician, the                         nurse, the anesthesiologist, the anesthetist and the                         technician. The procedure was verified in the                         procedure room.                        - ASA Grade Assessment: II - A patient with mild  systemic disease.                        After obtaining informed consent, the endoscope was                         passed under direct vision. Throughout the procedure,                         the patient's blood pressure, pulse, and oxygen                         saturations  were monitored continuously. The                         Endosonoscope was introduced through the mouth, and                         advanced to the second part of duodenum. The upper GI                         endoscopy was accomplished with ease. The patient                         tolerated the procedure well. Findings:      Localized mild mucosal changes characterized by discoloration were found       in the distal esophagus. Biopsies were taken with a cold forceps for       histology. Biopsies were obtained from the proximal and distal esophagus       with cold forceps for histology of suspected eosinophilic esophagitis.      The exam of the esophagus was otherwise normal.      Multiple 3 to 5 mm sessile polyps with no bleeding and no stigmata of       recent bleeding were found in the gastric body. Biopsies were taken with       a cold forceps for histology. Imaging was performed using the Pentax       i-Scan system to visualize the mucosa.      Patchy mildly erythematous mucosa without bleeding was found in the       gastric antrum. Biopsies were taken with a cold forceps for histology.       Biopsies were obtained in the gastric body, at the incisura and in the       gastric antrum with cold forceps for histology.      The duodenal bulb, second portion of the duodenum and examined duodenum       were normal. Impression:            - Discolored mucosa in the esophagus. Biopsied.                        - Multiple gastric polyps. Biopsied.                        - Erythematous mucosa in the antrum. Biopsied.                        - Normal duodenal bulb, second portion of the duodenum  and examined duodenum.                        - Biopsies were obtained in the gastric body, at the                         incisura and in the gastric antrum. Recommendation:        - Await pathology results.                        - Discharge patient to home (with escort).                         - Advance diet as tolerated.                        - Continue present medications.                        - Patient has a contact number available for                         emergencies. The signs and symptoms of potential                         delayed complications were discussed with the patient.                         Return to normal activities tomorrow. Written                         discharge instructions were provided to the patient.                        - Discharge patient to home (with escort).                        - The findings and recommendations were discussed with                         the patient.                        - The findings and recommendations were discussed with                         the patient's family. Procedure Code(s):     --- Professional ---                        [phone], Esophagogastroduodenoscopy, flexible,                         transoral; with biopsy, single or multiple Diagnosis Code(s):     --- Professional ---                        K22.8, Other specified diseases of esophagus                        K31.89, Other diseases of stomach and duodenum  K31.7, Polyp of stomach and duodenum                        R13.10, Dysphagia, unspecified CPT copyright 2019 American Medical Association. All rights reserved. The codes documented in this report are preliminary and upon coder review may  be revised to meet current compliance requirements.  [name], MD Michel Bickers B. Maximino Greenland MD, MD 05/22/2019 8:54:52 AM This report has been signed electronically. Number of Addenda: 0 Note Initiated On: 05/22/2019 8:30 AM Estimated Blood Loss:  Estimated blood loss: none.      [facility]

## 2019-05-22 NOTE — Anesthesia Procedure Notes (Signed)
Procedure Name: MAC Date/Time: 05/22/2019 8:50 AM Performed by: Jeannene Patella, CRNA Pre-anesthesia Checklist: Emergency Drugs available, Patient identified, Suction available, Patient being monitored and Timeout performed Patient Re-evaluated:Patient Re-evaluated prior to induction Oxygen Delivery Method: Nasal cannula

## 2019-05-25 ENCOUNTER — Ambulatory Visit (INDEPENDENT_AMBULATORY_CARE_PROVIDER_SITE_OTHER): Payer: Medicare HMO | Admitting: Licensed Clinical Social Worker

## 2019-05-25 ENCOUNTER — Telehealth: Payer: Self-pay

## 2019-05-25 ENCOUNTER — Encounter: Payer: Self-pay | Admitting: *Deleted

## 2019-05-25 ENCOUNTER — Other Ambulatory Visit: Payer: Self-pay

## 2019-05-25 DIAGNOSIS — R0782 Intercostal pain: Secondary | ICD-10-CM

## 2019-05-25 DIAGNOSIS — F411 Generalized anxiety disorder: Secondary | ICD-10-CM

## 2019-05-25 DIAGNOSIS — F330 Major depressive disorder, recurrent, mild: Secondary | ICD-10-CM

## 2019-05-25 DIAGNOSIS — E538 Deficiency of other specified B group vitamins: Secondary | ICD-10-CM | POA: Diagnosis not present

## 2019-05-25 NOTE — Telephone Encounter (Signed)
Patient called stating that she had her EGD done on 05/22/2019 and since then, she has had some intermittent right side chest pain radiating to her back. Patient stated that it went away on Saturday and got it back again yesterday. Patient wanted to know if this was okay. I told her that I would have to ask the physician since I was new to the practice and was not sure. Patient understood and will await for my call. Can you please advise.

## 2019-05-25 NOTE — Telephone Encounter (Signed)
Per Dr. Maximino Greenland:  Alyssa Spillers, MD  You 5 hours ago (11:44 AM)   Now, the symptoms are not expected after her EGD. Please order stat chest x-ray and EKG and ask patient to get this done today for chest pain. However, if she is still having chest pain currently, she should go to the ER    Patient was contacted to check how she was feeling at this time. Patient stated that she had been "feeling good throughout the day". Patient was told what Dr. Maximino Greenland recommended. Patient stated that since she was feeling good, she did not feel the need to go and have a chest X-ray or an EKG. Patient stated that she will give me a call tomorrow morning to let me know if she felt the need to go. However, I told patient that if she felt how she was this weekend, that she should not wait until tomorrow morning and go to the ED. Patient agreed and stated that she would do if needed but would rather wait and see.  I will order the EKG and Chest X-ray today just in case the patient decided on going today. This was informed to the patient.

## 2019-05-25 NOTE — Progress Notes (Signed)
Virtual Visit via Video Note  I connected with Alyssa Mcclure on 05/25/19 at 10:00 AM EDT by a video enabled telemedicine application and verified that I am speaking with the correct person using two identifiers.  The patient was advised to call back or seek an in-person evaluation if the symptoms worsen or if the condition fails to improve as anticipated.  I provided 40 minutes of non-face-to-face time during this encounter.   Francine Graven, LCSW     THERAPIST PROGRESS NOTE  Session Time: 10am-10:45am  Participation Level: Active  Behavioral Response: NeatAlertEuthymic  Type of Therapy: Individual Therapy  Treatment Goals addressed: Coping  Interventions: CBT, Motivational Interviewing and Supportive  Summary: Client presented alert and oriented for scheduled therapy session. Client checked in and reported that she has continued to stay busy with a stable mood. Client provided update on family dynamic and reported "I know my boundaries now". Client elaborated on what these boundaries look like specifically and processed how her boundaries vary with various relationships. Client briefly discussed her relationship with her son and identified an estranged daughter that she has no contact with at this time. Client was tearful through this discussion and reported that she isn't ready to talk about these issues at this time but would like to work on identified guilt and shame related to those relationships in future sessions. Client denied SI/HI/psychosis during this engagement.  Suicidal/Homicidal: Nowithout intent/plan  Therapist Response: Clinician joined session via tele-health platform and facilitated check in assessing recent stressors, changes in mood, and SI/HI/psychosis. Clinician inquired on overall functioning and means in which client has stayed busy while also engaging in self care. Clinician allowed space for processing of thoughts, feelings related to boundaries and how  client communicates those to others. Clinician utilized open ended questions and summarizations. Clinician validated feelings and provided support throughout session. Clinician utilized reflective listening during discussion on strained relationships and the feelings client experiences related to those. Clinician prompted client to identify agenda or goals for future sessions. Clinician will follow up in 3-4 weeks.  Plan: Return again in 3-4 weeks.  Diagnosis: Axis I: MDD, GAD        Francine Graven, LCSW 05/25/2019

## 2019-05-26 ENCOUNTER — Ambulatory Visit: Payer: Medicare HMO

## 2019-05-26 ENCOUNTER — Ambulatory Visit
Admission: RE | Admit: 2019-05-26 | Discharge: 2019-05-26 | Disposition: A | Discharge status: Home / Self Care | Payer: Medicare HMO | Source: Ambulatory Visit | Attending: Gastroenterology | Admitting: Gastroenterology

## 2019-05-26 ENCOUNTER — Other Ambulatory Visit: Payer: Self-pay

## 2019-05-26 DIAGNOSIS — R079 Chest pain, unspecified: Secondary | ICD-10-CM | POA: Diagnosis not present

## 2019-05-26 DIAGNOSIS — R0782 Intercostal pain: Secondary | ICD-10-CM

## 2019-05-26 NOTE — Telephone Encounter (Signed)
Per Dr. Maximino Greenland: Please tell the patient her chest x-ray was normal and her EKG did not show any concerning changes. We are still waiting for biopsy report. Please ask her to increase her omeprazole to 40 mg twice daily while we await her biopsy report.  Called patient but had to leave her a detailed message on what Dr. Maximino Greenland recommended for her as above. I told patient that we would call her once we had biopsy results but that in the meantime to increase her Omeprazole intake until we got biopsy results.

## 2019-05-26 NOTE — Telephone Encounter (Signed)
Patient went to have her chest X-Ray and EKD done this AM. Please let me know what to tell the patient of her findings. Thank you!

## 2019-05-27 LAB — SURGICAL PATHOLOGY

## 2019-05-28 DIAGNOSIS — K1379 Other lesions of oral mucosa: Secondary | ICD-10-CM | POA: Diagnosis not present

## 2019-05-28 DIAGNOSIS — Z780 Asymptomatic menopausal state: Secondary | ICD-10-CM | POA: Diagnosis not present

## 2019-05-28 DIAGNOSIS — E782 Mixed hyperlipidemia: Secondary | ICD-10-CM | POA: Diagnosis not present

## 2019-05-28 DIAGNOSIS — I10 Essential (primary) hypertension: Secondary | ICD-10-CM | POA: Diagnosis not present

## 2019-05-28 DIAGNOSIS — R7303 Prediabetes: Secondary | ICD-10-CM | POA: Diagnosis not present

## 2019-05-28 NOTE — Telephone Encounter (Signed)
Dr. Maximino Greenland, is there anything that I need to tell the patient from her biopsy results? Thanks.

## 2019-06-01 NOTE — Telephone Encounter (Signed)
Called patient and had to leave her a detailed message. I will also send her a MyChart message.

## 2019-06-01 NOTE — Telephone Encounter (Signed)
Per Dr. Maximino Greenland: Alyssa Spillers, MD  05/28/2019 1:17 PM EDT    Kandis Cocking please let the patient know, her biopsies were benign and did not show any signs of infection. She did have some gastritis in the stomach, and increased eosinophils at the end of the esophagus. This may be due to reflux. Is she still taking her omeprazole twice daily, 30 minutes before breakfast and 30 minutes before dinner? If not, that is the correct way of taking it. Is she still having trouble swallowing?   Called patient and had to leave her a detailed message so she would know her biopsy results and to let her know what Dr. Michele Mcalpine recommendation.

## 2019-06-04 DIAGNOSIS — N3946 Mixed incontinence: Secondary | ICD-10-CM | POA: Diagnosis not present

## 2019-06-04 DIAGNOSIS — R7303 Prediabetes: Secondary | ICD-10-CM | POA: Diagnosis not present

## 2019-06-04 DIAGNOSIS — F419 Anxiety disorder, unspecified: Secondary | ICD-10-CM | POA: Diagnosis not present

## 2019-06-04 DIAGNOSIS — F329 Major depressive disorder, single episode, unspecified: Secondary | ICD-10-CM | POA: Diagnosis not present

## 2019-06-04 DIAGNOSIS — E782 Mixed hyperlipidemia: Secondary | ICD-10-CM | POA: Diagnosis not present

## 2019-06-04 DIAGNOSIS — Z0000 Encounter for general adult medical examination without abnormal findings: Secondary | ICD-10-CM | POA: Diagnosis not present

## 2019-06-04 DIAGNOSIS — M170 Bilateral primary osteoarthritis of knee: Secondary | ICD-10-CM | POA: Diagnosis not present

## 2019-06-04 DIAGNOSIS — K219 Gastro-esophageal reflux disease without esophagitis: Secondary | ICD-10-CM | POA: Diagnosis not present

## 2019-06-04 DIAGNOSIS — I10 Essential (primary) hypertension: Secondary | ICD-10-CM | POA: Diagnosis not present

## 2019-06-09 ENCOUNTER — Telehealth (HOSPITAL_COMMUNITY): Payer: Self-pay | Admitting: Licensed Clinical Social Worker

## 2019-06-09 NOTE — Telephone Encounter (Signed)
Attempted to contact pt to discuss transition as this clinician will be leaving the clinic within the coming weeks. Left message asking for return of contact.

## 2019-06-22 ENCOUNTER — Ambulatory Visit (INDEPENDENT_AMBULATORY_CARE_PROVIDER_SITE_OTHER): Payer: Medicare HMO | Admitting: Licensed Clinical Social Worker

## 2019-06-22 ENCOUNTER — Other Ambulatory Visit: Payer: Self-pay

## 2019-06-22 DIAGNOSIS — F411 Generalized anxiety disorder: Secondary | ICD-10-CM | POA: Diagnosis not present

## 2019-06-22 DIAGNOSIS — F330 Major depressive disorder, recurrent, mild: Secondary | ICD-10-CM | POA: Diagnosis not present

## 2019-06-23 NOTE — Progress Notes (Signed)
Virtual Visit via Telephone Note  I connected with Alyssa Mcclure on 06/22/19 at 11:00 AM EDT by telephone and verified that I am speaking with the correct person using two identifiers.   I discussed the limitations, risks, security and privacy concerns of performing an evaluation and management service by telephone and the availability of in person appointments. I also discussed with the patient that there may be a patient responsible charge related to this service. The patient expressed understanding and agreed to proceed.   The patient was advised to call back or seek an in-person evaluation if the symptoms worsen or if the condition fails to improve as anticipated.  Location: Patient: Pt Home Therapist: [facility]  I provided 25 minutes of non-face-to-face time during this encounter.   Francine Graven, LCSW    THERAPIST PROGRESS NOTE  Session Time: 11:10am-11:35am  Participation Level: Active  Behavioral Response: NAAlertDepressed  Type of Therapy: Individual Therapy  Treatment Goals addressed: Coping  Interventions: CBT, Motivational Interviewing and Supportive  Summary: [name] presents alert and oriented for scheduled therapy session. Client engaged in check in reporting that she has continued to feel worsening depression and attributes this to "not taking care of myself and doing for everyone else". [name] processed feelings related to family conflict and how this impacted her mood. [name] was receptive to feedback and admitted that she tends to personalize things and denied motivation to discuss her feelings with particular family members "because I've been doing that for years" and reported feeling as though there would be no change or positive outcome should she have this conversation. [name] ended session in discussion on transition and agreed to be scheduled with clinician B. Morris as well as requested a list of other providers to be sent to her via e-mail. [name] denied  SI/HI/psychosis during this engagement.  Suicidal/Homicidal: Nowithout intent/plan  Therapist Response: Clinician joined clt for scheduled therapy session. Clinician facilitated check in assessing recent stressors, changes in mood, and SI/HI/psychosis. Clinician allowed space for processing of identified stressors and utilized MI, OARS to assess motivation in clt asserting herself to address these issues. Clinician provided supportive statements and validated feelings throughout session. Clinician initiated further discussion on transition and agreed to ask front desk staff to contact clt to schedule next therapy appt.   Plan: Pt to transition to clinician B. Morris for services due to this clinician's resignation effective 06/26/19.  Diagnosis: Axis I: MDD, GAD      Francine Graven, LCSW 06/23/2019

## 2019-06-25 DIAGNOSIS — E538 Deficiency of other specified B group vitamins: Secondary | ICD-10-CM | POA: Diagnosis not present

## 2019-07-20 ENCOUNTER — Ambulatory Visit (HOSPITAL_COMMUNITY): Payer: Medicare HMO | Admitting: Licensed Clinical Social Worker

## 2019-07-22 ENCOUNTER — Ambulatory Visit: Payer: Medicare HMO | Admitting: Gastroenterology

## 2019-07-27 DIAGNOSIS — E538 Deficiency of other specified B group vitamins: Secondary | ICD-10-CM | POA: Diagnosis not present

## 2019-08-18 DIAGNOSIS — S42031A Displaced fracture of lateral end of right clavicle, initial encounter for closed fracture: Secondary | ICD-10-CM | POA: Diagnosis not present

## 2019-08-18 DIAGNOSIS — R202 Paresthesia of skin: Secondary | ICD-10-CM | POA: Diagnosis not present

## 2019-08-18 DIAGNOSIS — G8929 Other chronic pain: Secondary | ICD-10-CM | POA: Diagnosis not present

## 2019-08-18 DIAGNOSIS — M542 Cervicalgia: Secondary | ICD-10-CM | POA: Diagnosis not present

## 2019-08-18 DIAGNOSIS — M25511 Pain in right shoulder: Secondary | ICD-10-CM | POA: Diagnosis not present

## 2019-08-27 DIAGNOSIS — E538 Deficiency of other specified B group vitamins: Secondary | ICD-10-CM | POA: Diagnosis not present

## 2019-09-01 DIAGNOSIS — M6281 Muscle weakness (generalized): Secondary | ICD-10-CM | POA: Diagnosis not present

## 2019-09-01 DIAGNOSIS — M542 Cervicalgia: Secondary | ICD-10-CM | POA: Diagnosis not present

## 2019-09-03 DIAGNOSIS — M6281 Muscle weakness (generalized): Secondary | ICD-10-CM | POA: Diagnosis not present

## 2019-09-03 DIAGNOSIS — M542 Cervicalgia: Secondary | ICD-10-CM | POA: Diagnosis not present

## 2019-09-07 DIAGNOSIS — Z03818 Encounter for observation for suspected exposure to other biological agents ruled out: Secondary | ICD-10-CM | POA: Diagnosis not present

## 2019-09-07 DIAGNOSIS — Z1152 Encounter for screening for COVID-19: Secondary | ICD-10-CM | POA: Diagnosis not present

## 2019-10-15 DIAGNOSIS — E538 Deficiency of other specified B group vitamins: Secondary | ICD-10-CM | POA: Diagnosis not present

## 2019-10-26 ENCOUNTER — Other Ambulatory Visit: Payer: Self-pay | Admitting: Family Medicine

## 2019-10-26 DIAGNOSIS — M5030 Other cervical disc degeneration, unspecified cervical region: Secondary | ICD-10-CM

## 2019-10-26 DIAGNOSIS — M5412 Radiculopathy, cervical region: Secondary | ICD-10-CM

## 2019-11-09 ENCOUNTER — Ambulatory Visit
Admission: RE | Admit: 2019-11-09 | Discharge: 2019-11-09 | Disposition: A | Discharge status: Home / Self Care | Payer: Medicare HMO | Source: Ambulatory Visit | Attending: Family Medicine | Admitting: Family Medicine

## 2019-11-09 ENCOUNTER — Other Ambulatory Visit: Payer: Self-pay

## 2019-11-09 DIAGNOSIS — M542 Cervicalgia: Secondary | ICD-10-CM | POA: Diagnosis not present

## 2019-11-09 DIAGNOSIS — M5412 Radiculopathy, cervical region: Secondary | ICD-10-CM | POA: Diagnosis not present

## 2019-11-09 DIAGNOSIS — M5030 Other cervical disc degeneration, unspecified cervical region: Secondary | ICD-10-CM

## 2019-11-10 DIAGNOSIS — M5030 Other cervical disc degeneration, unspecified cervical region: Secondary | ICD-10-CM | POA: Diagnosis not present

## 2019-11-10 DIAGNOSIS — M5412 Radiculopathy, cervical region: Secondary | ICD-10-CM | POA: Diagnosis not present

## 2019-11-12 DIAGNOSIS — Z96652 Presence of left artificial knee joint: Secondary | ICD-10-CM | POA: Diagnosis not present

## 2019-11-12 DIAGNOSIS — Z96651 Presence of right artificial knee joint: Secondary | ICD-10-CM | POA: Diagnosis not present

## 2019-11-12 DIAGNOSIS — Z96653 Presence of artificial knee joint, bilateral: Secondary | ICD-10-CM | POA: Diagnosis not present

## 2019-11-13 DIAGNOSIS — M5412 Radiculopathy, cervical region: Secondary | ICD-10-CM | POA: Diagnosis not present

## 2019-11-13 DIAGNOSIS — M5030 Other cervical disc degeneration, unspecified cervical region: Secondary | ICD-10-CM | POA: Diagnosis not present

## 2019-11-16 DIAGNOSIS — E538 Deficiency of other specified B group vitamins: Secondary | ICD-10-CM | POA: Diagnosis not present

## 2019-11-18 DIAGNOSIS — R7303 Prediabetes: Secondary | ICD-10-CM | POA: Diagnosis not present

## 2019-11-18 DIAGNOSIS — E782 Mixed hyperlipidemia: Secondary | ICD-10-CM | POA: Diagnosis not present

## 2019-11-18 DIAGNOSIS — I10 Essential (primary) hypertension: Secondary | ICD-10-CM | POA: Diagnosis not present

## 2019-11-23 ENCOUNTER — Other Ambulatory Visit: Payer: Self-pay | Admitting: Physician Assistant

## 2019-11-23 DIAGNOSIS — Z1231 Encounter for screening mammogram for malignant neoplasm of breast: Secondary | ICD-10-CM

## 2019-11-24 DIAGNOSIS — R7303 Prediabetes: Secondary | ICD-10-CM | POA: Diagnosis not present

## 2019-11-24 DIAGNOSIS — Z1231 Encounter for screening mammogram for malignant neoplasm of breast: Secondary | ICD-10-CM | POA: Diagnosis not present

## 2019-11-24 DIAGNOSIS — E782 Mixed hyperlipidemia: Secondary | ICD-10-CM | POA: Diagnosis not present

## 2019-11-24 DIAGNOSIS — M1712 Unilateral primary osteoarthritis, left knee: Secondary | ICD-10-CM | POA: Diagnosis not present

## 2019-11-24 DIAGNOSIS — K219 Gastro-esophageal reflux disease without esophagitis: Secondary | ICD-10-CM | POA: Diagnosis not present

## 2019-11-24 DIAGNOSIS — I10 Essential (primary) hypertension: Secondary | ICD-10-CM | POA: Diagnosis not present

## 2019-11-24 DIAGNOSIS — Z23 Encounter for immunization: Secondary | ICD-10-CM | POA: Diagnosis not present

## 2019-12-15 DIAGNOSIS — M5412 Radiculopathy, cervical region: Secondary | ICD-10-CM | POA: Diagnosis not present

## 2019-12-15 DIAGNOSIS — M4802 Spinal stenosis, cervical region: Secondary | ICD-10-CM | POA: Diagnosis not present

## 2019-12-15 DIAGNOSIS — M5030 Other cervical disc degeneration, unspecified cervical region: Secondary | ICD-10-CM | POA: Diagnosis not present

## 2019-12-15 DIAGNOSIS — M542 Cervicalgia: Secondary | ICD-10-CM | POA: Diagnosis not present

## 2019-12-17 ENCOUNTER — Other Ambulatory Visit: Payer: Self-pay

## 2019-12-17 ENCOUNTER — Ambulatory Visit
Admission: RE | Admit: 2019-12-17 | Discharge: 2019-12-17 | Disposition: A | Discharge status: Home / Self Care | Payer: Medicare HMO | Source: Ambulatory Visit | Attending: Physician Assistant | Admitting: Physician Assistant

## 2019-12-17 DIAGNOSIS — Z1231 Encounter for screening mammogram for malignant neoplasm of breast: Secondary | ICD-10-CM

## 2019-12-17 DIAGNOSIS — E538 Deficiency of other specified B group vitamins: Secondary | ICD-10-CM | POA: Diagnosis not present

## 2019-12-25 DIAGNOSIS — M542 Cervicalgia: Secondary | ICD-10-CM | POA: Diagnosis not present

## 2019-12-25 DIAGNOSIS — M4802 Spinal stenosis, cervical region: Secondary | ICD-10-CM | POA: Diagnosis not present

## 2019-12-25 DIAGNOSIS — M5030 Other cervical disc degeneration, unspecified cervical region: Secondary | ICD-10-CM | POA: Diagnosis not present

## 2019-12-25 DIAGNOSIS — M5412 Radiculopathy, cervical region: Secondary | ICD-10-CM | POA: Diagnosis not present

## 2020-01-15 DIAGNOSIS — M5030 Other cervical disc degeneration, unspecified cervical region: Secondary | ICD-10-CM | POA: Diagnosis not present

## 2020-01-15 DIAGNOSIS — M542 Cervicalgia: Secondary | ICD-10-CM | POA: Diagnosis not present

## 2020-01-15 DIAGNOSIS — M5412 Radiculopathy, cervical region: Secondary | ICD-10-CM | POA: Diagnosis not present

## 2020-01-15 DIAGNOSIS — M4802 Spinal stenosis, cervical region: Secondary | ICD-10-CM | POA: Diagnosis not present

## 2020-01-18 DIAGNOSIS — E538 Deficiency of other specified B group vitamins: Secondary | ICD-10-CM | POA: Diagnosis not present

## 2020-02-18 DIAGNOSIS — E538 Deficiency of other specified B group vitamins: Secondary | ICD-10-CM | POA: Diagnosis not present

## 2020-02-26 DIAGNOSIS — J019 Acute sinusitis, unspecified: Secondary | ICD-10-CM | POA: Diagnosis not present

## 2020-03-14 DIAGNOSIS — M67432 Ganglion, left wrist: Secondary | ICD-10-CM | POA: Diagnosis not present

## 2020-03-23 DIAGNOSIS — E538 Deficiency of other specified B group vitamins: Secondary | ICD-10-CM | POA: Diagnosis not present

## 2020-04-25 DIAGNOSIS — E538 Deficiency of other specified B group vitamins: Secondary | ICD-10-CM | POA: Diagnosis not present

## 2020-04-26 DIAGNOSIS — Z20822 Contact with and (suspected) exposure to covid-19: Secondary | ICD-10-CM | POA: Diagnosis not present

## 2020-05-01 DIAGNOSIS — J019 Acute sinusitis, unspecified: Secondary | ICD-10-CM | POA: Diagnosis not present

## 2020-05-01 DIAGNOSIS — Z20822 Contact with and (suspected) exposure to covid-19: Secondary | ICD-10-CM | POA: Diagnosis not present

## 2020-05-01 DIAGNOSIS — R059 Cough, unspecified: Secondary | ICD-10-CM | POA: Diagnosis not present

## 2020-05-02 DIAGNOSIS — M79675 Pain in left toe(s): Secondary | ICD-10-CM | POA: Diagnosis not present

## 2020-05-02 DIAGNOSIS — M79674 Pain in right toe(s): Secondary | ICD-10-CM | POA: Diagnosis not present

## 2020-05-02 DIAGNOSIS — L600 Ingrowing nail: Secondary | ICD-10-CM | POA: Diagnosis not present

## 2020-05-02 DIAGNOSIS — B351 Tinea unguium: Secondary | ICD-10-CM | POA: Diagnosis not present

## 2020-05-02 DIAGNOSIS — M19071 Primary osteoarthritis, right ankle and foot: Secondary | ICD-10-CM | POA: Diagnosis not present

## 2020-05-26 DIAGNOSIS — H5213 Myopia, bilateral: Secondary | ICD-10-CM | POA: Diagnosis not present

## 2020-05-26 DIAGNOSIS — E538 Deficiency of other specified B group vitamins: Secondary | ICD-10-CM | POA: Diagnosis not present

## 2020-05-26 DIAGNOSIS — Z0100 Encounter for examination of eyes and vision without abnormal findings: Secondary | ICD-10-CM | POA: Diagnosis not present

## 2020-06-13 DIAGNOSIS — E782 Mixed hyperlipidemia: Secondary | ICD-10-CM | POA: Diagnosis not present

## 2020-06-13 DIAGNOSIS — R7303 Prediabetes: Secondary | ICD-10-CM | POA: Diagnosis not present

## 2020-06-13 DIAGNOSIS — I10 Essential (primary) hypertension: Secondary | ICD-10-CM | POA: Diagnosis not present

## 2020-06-22 DIAGNOSIS — K648 Other hemorrhoids: Secondary | ICD-10-CM | POA: Diagnosis not present

## 2020-06-22 DIAGNOSIS — K219 Gastro-esophageal reflux disease without esophagitis: Secondary | ICD-10-CM | POA: Diagnosis not present

## 2020-06-22 DIAGNOSIS — Z0000 Encounter for general adult medical examination without abnormal findings: Secondary | ICD-10-CM | POA: Diagnosis not present

## 2020-06-22 DIAGNOSIS — M170 Bilateral primary osteoarthritis of knee: Secondary | ICD-10-CM | POA: Diagnosis not present

## 2020-06-22 DIAGNOSIS — Z780 Asymptomatic menopausal state: Secondary | ICD-10-CM | POA: Diagnosis not present

## 2020-06-22 DIAGNOSIS — I10 Essential (primary) hypertension: Secondary | ICD-10-CM | POA: Diagnosis not present

## 2020-06-22 DIAGNOSIS — R7303 Prediabetes: Secondary | ICD-10-CM | POA: Diagnosis not present

## 2020-06-22 DIAGNOSIS — E782 Mixed hyperlipidemia: Secondary | ICD-10-CM | POA: Diagnosis not present

## 2020-06-27 DIAGNOSIS — E538 Deficiency of other specified B group vitamins: Secondary | ICD-10-CM | POA: Diagnosis not present

## 2020-07-04 DIAGNOSIS — M8588 Other specified disorders of bone density and structure, other site: Secondary | ICD-10-CM | POA: Diagnosis not present

## 2020-07-05 DIAGNOSIS — K640 First degree hemorrhoids: Secondary | ICD-10-CM | POA: Diagnosis not present

## 2020-08-01 DIAGNOSIS — E538 Deficiency of other specified B group vitamins: Secondary | ICD-10-CM | POA: Diagnosis not present

## 2020-08-23 IMAGING — DX DG KNEE 1-2V PORT*L*
2 series · 2 of 2 positions shown · non-contrast
Comparison: None.

CLINICAL DATA: Status post left total knee replacement.

EXAM:
PORTABLE LEFT KNEE - 1-2 VIEW

[knee ap]
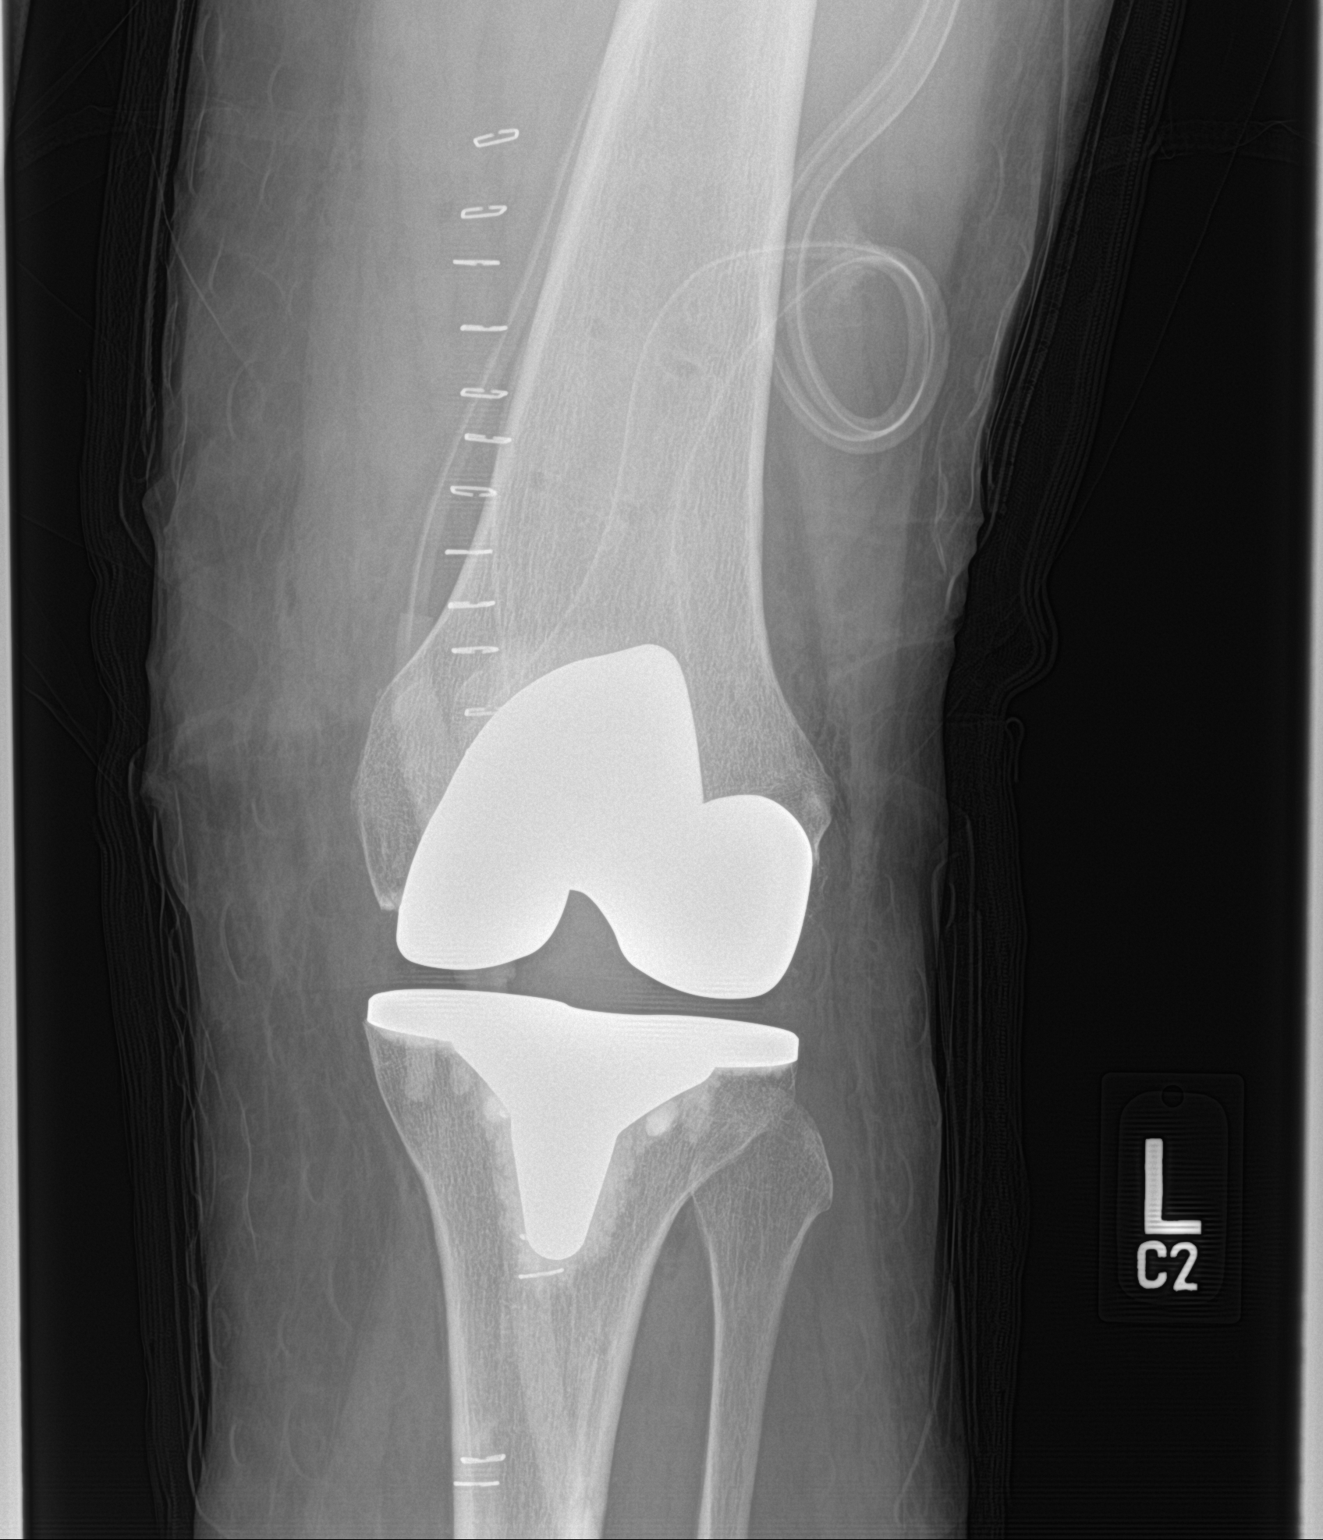

[knee lat]
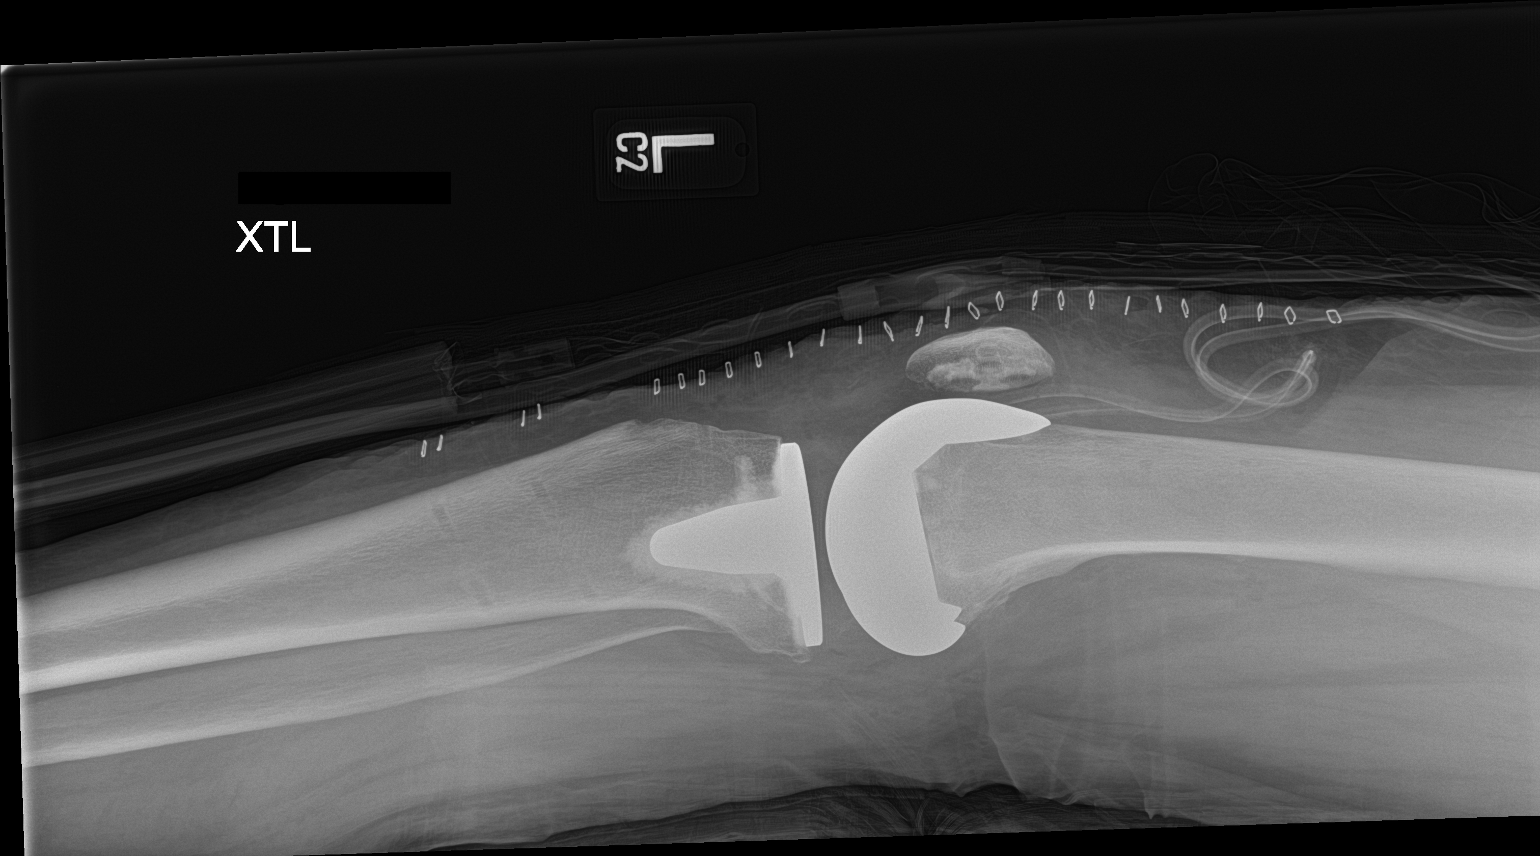

[2 of 2 positions shown; findings below may reference images not displayed]

FINDINGS: The components of the total knee prosthesis appear in excellent
position. No fractures. Drains present in the joint.
IMPRESSION: Satisfactory appearance of the left knee after total knee
replacement.

## 2020-09-01 DIAGNOSIS — E538 Deficiency of other specified B group vitamins: Secondary | ICD-10-CM | POA: Diagnosis not present

## 2020-09-19 DIAGNOSIS — M9931 Osseous stenosis of neural canal of cervical region: Secondary | ICD-10-CM | POA: Diagnosis not present

## 2020-09-19 DIAGNOSIS — M5412 Radiculopathy, cervical region: Secondary | ICD-10-CM | POA: Diagnosis not present

## 2020-09-19 DIAGNOSIS — M542 Cervicalgia: Secondary | ICD-10-CM | POA: Diagnosis not present

## 2020-10-10 DIAGNOSIS — E538 Deficiency of other specified B group vitamins: Secondary | ICD-10-CM | POA: Diagnosis not present

## 2020-10-30 DIAGNOSIS — M62838 Other muscle spasm: Secondary | ICD-10-CM | POA: Diagnosis not present

## 2020-10-30 DIAGNOSIS — S59901A Unspecified injury of right elbow, initial encounter: Secondary | ICD-10-CM | POA: Diagnosis not present

## 2020-10-30 DIAGNOSIS — W010XXA Fall on same level from slipping, tripping and stumbling without subsequent striking against object, initial encounter: Secondary | ICD-10-CM | POA: Diagnosis not present

## 2020-10-30 DIAGNOSIS — S59902A Unspecified injury of left elbow, initial encounter: Secondary | ICD-10-CM | POA: Diagnosis not present

## 2020-10-30 DIAGNOSIS — R051 Acute cough: Secondary | ICD-10-CM | POA: Diagnosis not present

## 2020-11-10 DIAGNOSIS — E538 Deficiency of other specified B group vitamins: Secondary | ICD-10-CM | POA: Diagnosis not present

## 2020-11-17 DIAGNOSIS — Z96653 Presence of artificial knee joint, bilateral: Secondary | ICD-10-CM | POA: Diagnosis not present

## 2020-11-28 DIAGNOSIS — M5412 Radiculopathy, cervical region: Secondary | ICD-10-CM | POA: Diagnosis not present

## 2020-12-09 DIAGNOSIS — U071 COVID-19: Secondary | ICD-10-CM | POA: Diagnosis not present

## 2020-12-09 DIAGNOSIS — R300 Dysuria: Secondary | ICD-10-CM | POA: Diagnosis not present

## 2020-12-09 DIAGNOSIS — J208 Acute bronchitis due to other specified organisms: Secondary | ICD-10-CM | POA: Diagnosis not present

## 2020-12-13 DIAGNOSIS — M5412 Radiculopathy, cervical region: Secondary | ICD-10-CM | POA: Diagnosis not present

## 2020-12-13 DIAGNOSIS — E538 Deficiency of other specified B group vitamins: Secondary | ICD-10-CM | POA: Diagnosis not present

## 2020-12-13 DIAGNOSIS — G8929 Other chronic pain: Secondary | ICD-10-CM | POA: Diagnosis not present

## 2020-12-13 DIAGNOSIS — M9931 Osseous stenosis of neural canal of cervical region: Secondary | ICD-10-CM | POA: Diagnosis not present

## 2020-12-13 DIAGNOSIS — M5441 Lumbago with sciatica, right side: Secondary | ICD-10-CM | POA: Diagnosis not present

## 2020-12-13 DIAGNOSIS — M542 Cervicalgia: Secondary | ICD-10-CM | POA: Diagnosis not present

## 2020-12-19 DIAGNOSIS — I10 Essential (primary) hypertension: Secondary | ICD-10-CM | POA: Diagnosis not present

## 2020-12-19 DIAGNOSIS — R7303 Prediabetes: Secondary | ICD-10-CM | POA: Diagnosis not present

## 2020-12-19 DIAGNOSIS — E782 Mixed hyperlipidemia: Secondary | ICD-10-CM | POA: Diagnosis not present

## 2020-12-26 ENCOUNTER — Other Ambulatory Visit: Payer: Self-pay | Admitting: Physician Assistant

## 2020-12-26 DIAGNOSIS — I10 Essential (primary) hypertension: Secondary | ICD-10-CM | POA: Diagnosis not present

## 2020-12-26 DIAGNOSIS — Z1231 Encounter for screening mammogram for malignant neoplasm of breast: Secondary | ICD-10-CM

## 2020-12-26 DIAGNOSIS — M5412 Radiculopathy, cervical region: Secondary | ICD-10-CM | POA: Diagnosis not present

## 2020-12-26 DIAGNOSIS — M5030 Other cervical disc degeneration, unspecified cervical region: Secondary | ICD-10-CM | POA: Diagnosis not present

## 2020-12-26 DIAGNOSIS — R7303 Prediabetes: Secondary | ICD-10-CM | POA: Diagnosis not present

## 2020-12-26 DIAGNOSIS — E538 Deficiency of other specified B group vitamins: Secondary | ICD-10-CM | POA: Diagnosis not present

## 2020-12-26 DIAGNOSIS — M1712 Unilateral primary osteoarthritis, left knee: Secondary | ICD-10-CM | POA: Diagnosis not present

## 2020-12-26 DIAGNOSIS — E782 Mixed hyperlipidemia: Secondary | ICD-10-CM | POA: Diagnosis not present

## 2021-01-13 DIAGNOSIS — E538 Deficiency of other specified B group vitamins: Secondary | ICD-10-CM | POA: Diagnosis not present

## 2021-02-13 DIAGNOSIS — E538 Deficiency of other specified B group vitamins: Secondary | ICD-10-CM | POA: Diagnosis not present

## 2021-02-15 ENCOUNTER — Other Ambulatory Visit: Payer: Self-pay

## 2021-02-15 ENCOUNTER — Ambulatory Visit
Admission: RE | Admit: 2021-02-15 | Discharge: 2021-02-15 | Disposition: A | Discharge status: Home / Self Care | Payer: Medicare HMO | Source: Ambulatory Visit | Attending: Physician Assistant | Admitting: Physician Assistant

## 2021-02-15 DIAGNOSIS — Z1231 Encounter for screening mammogram for malignant neoplasm of breast: Secondary | ICD-10-CM | POA: Insufficient documentation

## 2021-03-16 DIAGNOSIS — E538 Deficiency of other specified B group vitamins: Secondary | ICD-10-CM | POA: Diagnosis not present

## 2021-03-21 DIAGNOSIS — M2550 Pain in unspecified joint: Secondary | ICD-10-CM | POA: Diagnosis not present

## 2021-03-21 DIAGNOSIS — I10 Essential (primary) hypertension: Secondary | ICD-10-CM | POA: Diagnosis not present

## 2021-03-21 DIAGNOSIS — R5382 Chronic fatigue, unspecified: Secondary | ICD-10-CM | POA: Diagnosis not present

## 2021-03-21 DIAGNOSIS — R7303 Prediabetes: Secondary | ICD-10-CM | POA: Diagnosis not present

## 2021-03-21 DIAGNOSIS — R0609 Other forms of dyspnea: Secondary | ICD-10-CM | POA: Diagnosis not present

## 2021-03-21 DIAGNOSIS — R0600 Dyspnea, unspecified: Secondary | ICD-10-CM | POA: Diagnosis not present

## 2021-03-21 DIAGNOSIS — E538 Deficiency of other specified B group vitamins: Secondary | ICD-10-CM | POA: Diagnosis not present

## 2021-04-18 DIAGNOSIS — E538 Deficiency of other specified B group vitamins: Secondary | ICD-10-CM | POA: Diagnosis not present

## 2021-04-26 NOTE — Progress Notes (Signed)
?  ?  ? ? ? ?Date:  05/01/2021  ? ?ID:  Alyssa Mcclure, DOB 11-Nov-1950, MRN [id number] ? ?Patient Location:  ?[address] ?[place] [id number]  ? ?Provider location:   ?[facility], [facility] office ? ?PCP:  [name], MD  ?Cardiologist:  [facility] ? ?Chief Complaint  ?Patient presents with  ? Shortness of Breath  ?  Patient c/o chest tightness, fatigue and shortness of breath on exertion. Medications reviewed by the patient verbally.   ? ? ?History of Present Illness:   ? ?Alyssa Mcclure is a 71 y.o. female past medical history of ?Hyperlipidemia ?Hypertension, ?Chronic cough ?GERD ?Former smoker, quit 20 yrs ago, smoked 30 yrs ?Who presents for follow-up for chest tightness ? 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 ?Strong family hx of cardiac disease ?Concerned about her heart, having shortness of breath, rare chest pains ? ?Gets winded at times, SOB up hills ?Walking the dog for exercise ? ?Tired all the time, on B12 shot/pill ? ?Total chol 182, LDL 103 ?TSH 1.5 ? ?EKG personally reviewed by myself on todays visit ?Normal sinus rhythm rate 64 bpm no significant ST or T wave changes ? ?Past medical history reviewed ?Stress echocardiogram ordered for chronic low-grade chest heaviness ? performed at [facility] June 09, 2018 ?Read by Bethann Punches ?Exercise for 3 minutes 50 seconds ?Report indicating mild hypokinesis of the basal inferior wall ?Ejection fraction greater than 55% ?Achieved 6.3 METS ?Target heart rate achieved 160 bpm ? ? ?Past Medical History:  ?Diagnosis Date  ? Arthritis   ? neck and back and joints  ? B12 deficiency   ? Depression   ? GERD (gastroesophageal reflux disease)   ? H/O Bell's palsy   ? Heart murmur   ? causes no issues  ? HLD (hyperlipidemia)   ? HOH (hard of hearing)   ? bilateral  ? Hypertension   ? controlled on meds  ? Multiple thyroid  nodules   ? Pre-diabetes   ? Sleep apnea   ? mild case per Dr.  Marland Kitchen ?Past Surgical History:  ?Procedure Laterality Date  ? ABDOMINAL HYSTERECTOMY    ? ABDOMINOPLASTY    ? BILATERAL CARPAL TUNNEL RELEASE Bilateral   ? bladder lift  2011  ? BROW LIFT Bilateral 06/12/2016  ? Procedure: BLEPHAROPLASTY UPPER EYELID WITH EXCESS SKIN;  Surgeon: Alyssa Riches, MD;  Location: [facility];  Service: Ophthalmology;  Laterality: Bilateral;  ? CATARACT EXTRACTION W/PHACO Right 05/09/2015  ? Procedure: CATARACT EXTRACTION PHACO AND INTRAOCULAR LENS PLACEMENT (IOC);  Surgeon: Alyssa Lange, MD;  Location: [facility];  Service: Ophthalmology;  Laterality: Right;  [place] 45.1 ?AP% 21.0 ?CDE 19.36 ?Fluid Pack Lot # [id number]  ? CATARACT EXTRACTION W/PHACO Left 03/28/2016  ? Procedure: CATARACT EXTRACTION PHACO AND INTRAOCULAR LENS PLACEMENT (IOC);  Surgeon: Alyssa Lange, MD;  Location: [facility];  Service: Ophthalmology;  Laterality: Left;  [place] 01:05 ?AP% 21.3 ?CDE 24.21 ?fluid pack lot # [id number]  ? CESAREAN SECTION    ? ESOPHAGOGASTRODUODENOSCOPY (EGD) WITH PROPOFOL N/A 05/22/2019  ? Procedure: ESOPHAGOGASTRODUODENOSCOPY (EGD) WITH BIOPSY;  Surgeon: Alyssa Spillers, MD;  Location: [facility];  Service: Endoscopy;  Laterality: N/A;  ? HERNIA REPAIR    ? KNEE ARTHROPLASTY Right 07/02/2018  ? Procedure: COMPUTER ASSISTED TOTAL KNEE ARTHROPLASTY RIGHT;  Surgeon: Alyssa Heinz, MD;  Location: [facility];  Service: Orthopedics;  Laterality: Right;  ? KNEE ARTHROPLASTY Left 11/12/2018  ? Procedure: COMPUTER ASSISTED TOTAL KNEE ARTHROPLASTY;  Surgeon: Alyssa Heinz, MD;  Location: [facility];  Service: Orthopedics;  Laterality: Left;  ? KNEE ARTHROSCOPY Bilateral   ? ROTATOR CUFF REPAIR Bilateral   ?  ? ?Current Meds  ?Medication Sig  ? aspirin EC 81 MG tablet Take 81 mg by mouth daily. Swallow whole.  ? Biotin 1000 MCG tablet Take 1,000 mcg by mouth daily.  ? Cyanocobalamin (VITAMIN B 12 PO) Take 1,000 mcg by mouth daily.  ?  Digestive Enzymes (DIGESTIVE ENZYME PO) Take 1 tablet by mouth daily. With prebiotic and probiotics  ? DULoxetine (CYMBALTA) 60 MG capsule Take 60 mg by mouth every morning.   ? gabapentin (NEURONTIN) 300 MG capsule Take 300 mg by mouth daily.  ? losartan (COZAAR) 100 MG tablet Take 100 mg by mouth every morning.   ? nystatin cream (MYCOSTATIN) Apply topically 2 (two) times daily.  ? omeprazole (PRILOSEC) 20 MG capsule Take 1 capsule (20 mg total) by mouth 2 (two) times daily before a meal. Take 1 capsule (20 MG total) by mouth two times a day for 30 days and then take 1 capsule (20 MG total) once a day for 30 days.  ? Probiotic Product (PROBIOTIC-10 PO) Take by mouth in the morning and at bedtime.  ? Psyllium (METAMUCIL FIBER) 51.7 % PACK Take 1 packet by mouth daily.  ? traZODone (DESYREL) 50 MG tablet Take 25 mg by mouth at bedtime as needed for sleep.   ?  ? ?Allergies:   Penicillins, Adhesive [tape], Losartan, and Statins  ? ?Social History  ? ?Tobacco Use  ? Smoking status: Former  ?  Packs/day: 0.50  ?  Years: 30.00  ?  Pack years: 15.00  ?  Types: Cigarettes  ?  Quit date: 2005  ?  Years since quitting: 18.3  ? Smokeless tobacco: Never  ? Tobacco comments:  ?  quit 10 + years  ?Vaping Use  ? Vaping Use: Never used  ?Substance Use Topics  ? Alcohol use: Yes  ?  Alcohol/week: 1.0 standard drink  ?  Types: 1 Glasses of wine per week  ?  Comment: occ  ? Drug use: No  ?  ? ?Current Outpatient Medications on File Prior to Visit  ?Medication Sig Dispense Refill  ? aspirin EC 81 MG tablet Take 81 mg by mouth daily. Swallow whole.    ? Biotin 1000 MCG tablet Take 1,000 mcg by mouth daily.    ? Cyanocobalamin (VITAMIN B 12 PO) Take 1,000 mcg by mouth daily.    ? Digestive Enzymes (DIGESTIVE ENZYME PO) Take 1 tablet by mouth daily. With prebiotic and probiotics    ? DULoxetine (CYMBALTA) 60 MG capsule Take 60 mg by mouth every morning.     ? gabapentin (NEURONTIN) 300 MG capsule Take 300 mg by mouth daily.    ?  losartan (COZAAR) 100 MG tablet Take 100 mg by mouth every morning.     ? nystatin cream (MYCOSTATIN) Apply topically 2 (two) times daily.    ? omeprazole (PRILOSEC) 20 MG capsule Take 1 capsule (20 mg total) by mouth 2 (two) times daily before a meal. Take 1 capsule (20 MG total) by mouth two times a day for 30 days and then take 1 capsule (20 MG total) once a day for 30 days. 60 capsule 1  ? Probiotic Product (PROBIOTIC-10 PO) Take by mouth in the morning  and at bedtime.    ? Psyllium (METAMUCIL FIBER) 51.7 % PACK Take 1 packet by mouth daily. 30 each 0  ? traZODone (DESYREL) 50 MG tablet Take 25 mg by mouth at bedtime as needed for sleep.     ? bisacodyl (BISACODYL) 5 MG EC tablet Take 2 tablets (10 mg total) by mouth daily as needed for moderate constipation. (Patient not taking: Reported on 05/13/2019) 30 tablet 0  ? ?No current facility-administered medications on file prior to visit.  ?  ? ?Family Hx: ?The patient's family history includes Breast cancer in her cousin and paternal aunt; Breast cancer (age of onset: 99) in her daughter; Heart attack (age of onset: 1) in her father; Heart disease in her mother; Hyperlipidemia in her father and mother; Hypertension in her father and mother. There is no history of Prostate cancer, Kidney cancer, or Bladder Cancer. ? ?ROS:   ?Please see the history of present illness.    ?Review of Systems  ?Constitutional: Negative.   ?HENT: Negative.    ?Respiratory: Negative.    ?Cardiovascular: Negative.   ?Gastrointestinal: Negative.   ?Musculoskeletal: Negative.   ?Neurological: Negative.   ?Psychiatric/Behavioral: Negative.    ?All other systems reviewed and are negative.  ? ?Labs/Other Tests and Data Reviewed:   ? ?Recent Labs: ?No results found for requested labs within last 8760 hours.  ? ?Recent Lipid Panel ?No results found for: CHOL, TRIG, HDL, CHOLHDL, LDLCALC, LDLDIRECT ? ?Wt Readings from Last 3 Encounters:  ?05/01/21 167 lb (75.8 kg)  ?05/22/19 168 lb (76.2 kg)   ?11/27/18 184 lb (83.5 kg)  ?  ? ?Exam:   ? ?Vital Signs: Vital signs may also be detailed in the HPI ?BP 120/80 (BP Location: Right Arm, Patient Position: Sitting, Cuff Size: Normal)   Pulse 64   Ht 5\' 3"  (1.6

## 2021-05-01 ENCOUNTER — Encounter: Payer: Self-pay | Admitting: Cardiovascular Disease

## 2021-05-01 ENCOUNTER — Ambulatory Visit (INDEPENDENT_AMBULATORY_CARE_PROVIDER_SITE_OTHER): Payer: Medicare HMO | Admitting: Cardiovascular Disease

## 2021-05-01 VITALS — BP 120/80 | HR 64 | Ht 63.0 in | Wt 167.0 lb

## 2021-05-01 DIAGNOSIS — E782 Mixed hyperlipidemia: Secondary | ICD-10-CM

## 2021-05-01 DIAGNOSIS — R072 Precordial pain: Secondary | ICD-10-CM

## 2021-05-01 DIAGNOSIS — R0789 Other chest pain: Secondary | ICD-10-CM | POA: Diagnosis not present

## 2021-05-01 DIAGNOSIS — K219 Gastro-esophageal reflux disease without esophagitis: Secondary | ICD-10-CM | POA: Diagnosis not present

## 2021-05-01 DIAGNOSIS — I209 Angina pectoris, unspecified: Secondary | ICD-10-CM | POA: Diagnosis not present

## 2021-05-01 DIAGNOSIS — I10 Essential (primary) hypertension: Secondary | ICD-10-CM

## 2021-05-01 MED ORDER — METOPROLOL TARTRATE 50 MG PO TABS: ORAL_TABLET | ORAL | 0 refills | Status: AC

## 2021-05-01 NOTE — Patient Instructions (Addendum)
Medication Instructions:  ?No changes ? ?If you need a refill on your cardiac medications before your next appointment, please call your pharmacy.  ? ?Lab work: ? ?Today: BMP ? ?Testing/Procedures: ?Your cardiac CT is scheduled for Thursday, April 27th, at 2:30pm at:  ? ?[facility] ?[address] ?[address] ?[place], [place] [address] ?[phone] ? ?Please arrive 15 mins early for check-in and test prep. ? ? ? ?Please follow these instructions carefully (unless otherwise directed): ? ? ?On the Night Before the Test: ?Be sure to Drink plenty of water. ?Do not consume any caffeinated/decaffeinated beverages or chocolate 12 hours prior to your test. ? ?On the Day of the Test: ?Drink plenty of water until 1 hour prior to the test. ?Do not eat any food 4 hours prior to the test. ?You may take your regular medications prior to the test.  ?Take metoprolol (Lopressor) two hours prior to test. ?FEMALES- please wear underwire-free bra if available, avoid dresses & tight clothing ? ? ?After the Test: ?Drink plenty of water. ?After receiving IV contrast, you may experience a mild flushed feeling. This is normal. ?On occasion, you may experience a mild rash up to 24 hours after the test. This is not dangerous. If this occurs, you can take Benadryl 25 mg and increase your fluid intake. ?If you experience trouble breathing, this can be serious. If it is severe call 911 IMMEDIATELY. If it is mild, please call our office. ? ?Please allow 2-4 weeks for scheduling of routine cardiac CTs. Some insurance companies require a pre-authorization which may delay scheduling of this test.  ? ?For non-scheduling related questions, please contact the cardiac imaging nurse navigator should you have any questions/concerns: ?Rockwell Alexandria, Cardiac Imaging Nurse Navigator ?Larey Brick, Cardiac Imaging Nurse Navigator ?[facility] ?Direct Office Dial: [phone]  ? ?For scheduling  needs, including cancellations and rescheduling, please call [place], [phone].   ? ?Follow-Up: ?At [facility], you and your health needs are our priority.  As part of our continuing mission to provide you with exceptional heart care, we have created designated Provider Care Teams.  These Care Teams include your primary Cardiologist (physician) and Advanced Practice Providers (APPs -  Physician Assistants and Nurse Practitioners) who all work together to provide you with the care you need, when you need it. ? ?You will need a follow up appointment in 12 months ? ?Providers on your designated Care Team:   ?Nicolasa Ducking, NP ?Eula Listen, PA-C ?Cadence Fransico Michael, PA-C ? ?COVID-19 Vaccine Information can be found at: [email or web address] For questions related to vaccine distribution or appointments, please email [email or web address] or call [phone].  ? ?

## 2021-05-02 LAB — BASIC METABOLIC PANEL
BUN/Creatinine Ratio: 19 (ref 12–28)
BUN: 14 mg/dL (ref 8–27)
CO2: 21 mmol/L (ref 20–29)
Calcium: 9.4 mg/dL (ref 8.7–10.3)
Chloride: 103 mmol/L (ref 96–106)
Creatinine, Ser: 0.74 mg/dL (ref 0.57–1.00)
Sodium: 141 mmol/L (ref 134–144)
eGFR: 86 mL/min/{1.73_m2} (ref >59)

## 2021-05-03 ENCOUNTER — Telehealth (HOSPITAL_COMMUNITY): Payer: Self-pay | Admitting: Emergency Medicine

## 2021-05-03 NOTE — Telephone Encounter (Signed)
Attempted to call patient regarding upcoming cardiac CT appointment. °Left message on voicemail with name and callback number °Juliauna Stueve RN Navigator Cardiac Imaging °[facility] °[phone] Office °[phone] Cell ° °

## 2021-05-04 ENCOUNTER — Ambulatory Visit
Admission: RE | Admit: 2021-05-04 | Discharge: 2021-05-04 | Disposition: A | Discharge status: Home / Self Care | Payer: Medicare HMO | Source: Ambulatory Visit | Attending: Cardiovascular Disease | Admitting: Cardiovascular Disease

## 2021-05-04 DIAGNOSIS — R072 Precordial pain: Secondary | ICD-10-CM | POA: Insufficient documentation

## 2021-05-04 MED ORDER — IOHEXOL 350 MG/ML SOLN
75.0000 mL | Freq: Once | INTRAVENOUS | Status: AC | PRN
  Administered 2021-05-04: 75 mL via INTRAVENOUS

## 2021-05-04 MED ORDER — NITROGLYCERIN 0.4 MG SL SUBL
0.8000 mg | SUBLINGUAL_TABLET | Freq: Once | SUBLINGUAL | Status: AC
  Administered 2021-05-04: 0.8 mg via SUBLINGUAL

## 2021-05-04 NOTE — Progress Notes (Signed)
Patient tolerated procedure well. Ambulate w/o difficulty. Denies light headedness or being dizzy. Sitting in chair drinking water provided. Encouraged to drink extra water today and reasoning explained. Verbalized understanding. All questions answered. ABC intact. No further needs. Discharge from procedure area w/o issues.   °

## 2021-05-05 ENCOUNTER — Telehealth: Payer: Self-pay | Admitting: Emergency Medicine

## 2021-05-05 NOTE — Telephone Encounter (Signed)
-----   Message from Antonieta Iba, MD sent at 05/04/2021  6:21 PM EDT ----- ?Cardiac CTA ?Minimal coronary calcification, no significant stenosis ?No other acute findings on over read by radiology ?

## 2021-05-05 NOTE — Telephone Encounter (Signed)
Called and spoke with patient. Results reviewed with patient, pt verbalized understanding,  questions (if any) answered.   ?

## 2021-05-09 DIAGNOSIS — Z961 Presence of intraocular lens: Secondary | ICD-10-CM | POA: Diagnosis not present

## 2021-05-22 DIAGNOSIS — E538 Deficiency of other specified B group vitamins: Secondary | ICD-10-CM | POA: Diagnosis not present

## 2021-06-20 DIAGNOSIS — I10 Essential (primary) hypertension: Secondary | ICD-10-CM | POA: Diagnosis not present

## 2021-06-20 DIAGNOSIS — R7303 Prediabetes: Secondary | ICD-10-CM | POA: Diagnosis not present

## 2021-06-20 DIAGNOSIS — E782 Mixed hyperlipidemia: Secondary | ICD-10-CM | POA: Diagnosis not present

## 2021-06-22 DIAGNOSIS — E538 Deficiency of other specified B group vitamins: Secondary | ICD-10-CM | POA: Diagnosis not present

## 2021-06-27 DIAGNOSIS — M1712 Unilateral primary osteoarthritis, left knee: Secondary | ICD-10-CM | POA: Diagnosis not present

## 2021-06-27 DIAGNOSIS — E538 Deficiency of other specified B group vitamins: Secondary | ICD-10-CM | POA: Diagnosis not present

## 2021-06-27 DIAGNOSIS — I10 Essential (primary) hypertension: Secondary | ICD-10-CM | POA: Diagnosis not present

## 2021-06-27 DIAGNOSIS — E782 Mixed hyperlipidemia: Secondary | ICD-10-CM | POA: Diagnosis not present

## 2021-06-27 DIAGNOSIS — Z0000 Encounter for general adult medical examination without abnormal findings: Secondary | ICD-10-CM | POA: Diagnosis not present

## 2021-06-27 DIAGNOSIS — R7303 Prediabetes: Secondary | ICD-10-CM | POA: Diagnosis not present

## 2021-07-24 DIAGNOSIS — E538 Deficiency of other specified B group vitamins: Secondary | ICD-10-CM | POA: Diagnosis not present

## 2021-08-24 DIAGNOSIS — E538 Deficiency of other specified B group vitamins: Secondary | ICD-10-CM | POA: Diagnosis not present

## 2021-09-21 DIAGNOSIS — R8290 Unspecified abnormal findings in urine: Secondary | ICD-10-CM | POA: Diagnosis not present

## 2021-09-21 DIAGNOSIS — R300 Dysuria: Secondary | ICD-10-CM | POA: Diagnosis not present

## 2021-09-25 DIAGNOSIS — E538 Deficiency of other specified B group vitamins: Secondary | ICD-10-CM | POA: Diagnosis not present

## 2021-10-26 DIAGNOSIS — E538 Deficiency of other specified B group vitamins: Secondary | ICD-10-CM | POA: Diagnosis not present

## 2021-10-30 DIAGNOSIS — R413 Other amnesia: Secondary | ICD-10-CM | POA: Diagnosis not present

## 2021-10-30 DIAGNOSIS — R1012 Left upper quadrant pain: Secondary | ICD-10-CM | POA: Diagnosis not present

## 2021-10-30 DIAGNOSIS — R109 Unspecified abdominal pain: Secondary | ICD-10-CM | POA: Diagnosis not present

## 2021-11-16 DIAGNOSIS — M7062 Trochanteric bursitis, left hip: Secondary | ICD-10-CM | POA: Diagnosis not present

## 2021-11-16 DIAGNOSIS — Z96653 Presence of artificial knee joint, bilateral: Secondary | ICD-10-CM | POA: Diagnosis not present

## 2021-11-16 DIAGNOSIS — M25552 Pain in left hip: Secondary | ICD-10-CM | POA: Diagnosis not present

## 2021-11-27 DIAGNOSIS — E538 Deficiency of other specified B group vitamins: Secondary | ICD-10-CM | POA: Diagnosis not present

## 2021-12-04 DIAGNOSIS — H903 Sensorineural hearing loss, bilateral: Secondary | ICD-10-CM | POA: Diagnosis not present

## 2021-12-20 DIAGNOSIS — M1712 Unilateral primary osteoarthritis, left knee: Secondary | ICD-10-CM | POA: Diagnosis not present

## 2021-12-20 DIAGNOSIS — E782 Mixed hyperlipidemia: Secondary | ICD-10-CM | POA: Diagnosis not present

## 2021-12-20 DIAGNOSIS — I10 Essential (primary) hypertension: Secondary | ICD-10-CM | POA: Diagnosis not present

## 2021-12-27 ENCOUNTER — Other Ambulatory Visit: Payer: Self-pay | Admitting: Physician Assistant

## 2021-12-27 DIAGNOSIS — N390 Urinary tract infection, site not specified: Secondary | ICD-10-CM | POA: Diagnosis not present

## 2021-12-27 DIAGNOSIS — M1712 Unilateral primary osteoarthritis, left knee: Secondary | ICD-10-CM | POA: Diagnosis not present

## 2021-12-27 DIAGNOSIS — M5412 Radiculopathy, cervical region: Secondary | ICD-10-CM | POA: Diagnosis not present

## 2021-12-27 DIAGNOSIS — R7303 Prediabetes: Secondary | ICD-10-CM | POA: Diagnosis not present

## 2021-12-27 DIAGNOSIS — Z1231 Encounter for screening mammogram for malignant neoplasm of breast: Secondary | ICD-10-CM

## 2021-12-27 DIAGNOSIS — I10 Essential (primary) hypertension: Secondary | ICD-10-CM | POA: Diagnosis not present

## 2021-12-27 DIAGNOSIS — E782 Mixed hyperlipidemia: Secondary | ICD-10-CM | POA: Diagnosis not present

## 2021-12-28 DIAGNOSIS — E538 Deficiency of other specified B group vitamins: Secondary | ICD-10-CM | POA: Diagnosis not present

## 2022-01-29 DIAGNOSIS — M7541 Impingement syndrome of right shoulder: Secondary | ICD-10-CM | POA: Diagnosis not present

## 2022-01-29 DIAGNOSIS — E538 Deficiency of other specified B group vitamins: Secondary | ICD-10-CM | POA: Diagnosis not present

## 2022-01-29 DIAGNOSIS — M7581 Other shoulder lesions, right shoulder: Secondary | ICD-10-CM | POA: Diagnosis not present

## 2022-03-01 DIAGNOSIS — E538 Deficiency of other specified B group vitamins: Secondary | ICD-10-CM | POA: Diagnosis not present

## 2022-04-02 DIAGNOSIS — E538 Deficiency of other specified B group vitamins: Secondary | ICD-10-CM | POA: Diagnosis not present

## 2022-05-03 DIAGNOSIS — E538 Deficiency of other specified B group vitamins: Secondary | ICD-10-CM | POA: Diagnosis not present

## 2022-05-09 DIAGNOSIS — J209 Acute bronchitis, unspecified: Secondary | ICD-10-CM | POA: Diagnosis not present

## 2022-05-21 DIAGNOSIS — M7541 Impingement syndrome of right shoulder: Secondary | ICD-10-CM | POA: Diagnosis not present

## 2022-05-21 DIAGNOSIS — M7581 Other shoulder lesions, right shoulder: Secondary | ICD-10-CM | POA: Diagnosis not present

## 2022-05-21 DIAGNOSIS — I10 Essential (primary) hypertension: Secondary | ICD-10-CM | POA: Diagnosis not present

## 2022-05-21 DIAGNOSIS — E782 Mixed hyperlipidemia: Secondary | ICD-10-CM | POA: Diagnosis not present

## 2022-05-21 DIAGNOSIS — R7303 Prediabetes: Secondary | ICD-10-CM | POA: Diagnosis not present

## 2022-05-23 DIAGNOSIS — Z961 Presence of intraocular lens: Secondary | ICD-10-CM | POA: Diagnosis not present

## 2022-05-28 DIAGNOSIS — K219 Gastro-esophageal reflux disease without esophagitis: Secondary | ICD-10-CM | POA: Diagnosis not present

## 2022-05-28 DIAGNOSIS — I10 Essential (primary) hypertension: Secondary | ICD-10-CM | POA: Diagnosis not present

## 2022-05-28 DIAGNOSIS — M1712 Unilateral primary osteoarthritis, left knee: Secondary | ICD-10-CM | POA: Diagnosis not present

## 2022-05-28 DIAGNOSIS — L819 Disorder of pigmentation, unspecified: Secondary | ICD-10-CM | POA: Diagnosis not present

## 2022-05-28 DIAGNOSIS — Z0000 Encounter for general adult medical examination without abnormal findings: Secondary | ICD-10-CM | POA: Diagnosis not present

## 2022-05-28 DIAGNOSIS — R252 Cramp and spasm: Secondary | ICD-10-CM | POA: Diagnosis not present

## 2022-05-28 DIAGNOSIS — R7303 Prediabetes: Secondary | ICD-10-CM | POA: Diagnosis not present

## 2022-05-28 DIAGNOSIS — E782 Mixed hyperlipidemia: Secondary | ICD-10-CM | POA: Diagnosis not present

## 2022-05-30 ENCOUNTER — Ambulatory Visit
Admission: RE | Admit: 2022-05-30 | Discharge: 2022-05-30 | Disposition: A | Discharge status: Home / Self Care | Payer: PPO | Source: Ambulatory Visit | Attending: Physician Assistant | Admitting: Physician Assistant

## 2022-05-30 DIAGNOSIS — Z1231 Encounter for screening mammogram for malignant neoplasm of breast: Secondary | ICD-10-CM | POA: Insufficient documentation

## 2022-06-05 DIAGNOSIS — E538 Deficiency of other specified B group vitamins: Secondary | ICD-10-CM | POA: Diagnosis not present

## 2022-06-13 ENCOUNTER — Other Ambulatory Visit (INDEPENDENT_AMBULATORY_CARE_PROVIDER_SITE_OTHER): Payer: Self-pay | Admitting: Nurse Practitioner

## 2022-06-13 DIAGNOSIS — L819 Disorder of pigmentation, unspecified: Secondary | ICD-10-CM

## 2022-06-13 DIAGNOSIS — R252 Cramp and spasm: Secondary | ICD-10-CM

## 2022-06-20 ENCOUNTER — Ambulatory Visit (INDEPENDENT_AMBULATORY_CARE_PROVIDER_SITE_OTHER): Payer: PPO

## 2022-06-20 ENCOUNTER — Ambulatory Visit (INDEPENDENT_AMBULATORY_CARE_PROVIDER_SITE_OTHER): Payer: PPO | Admitting: Nurse Practitioner

## 2022-06-20 ENCOUNTER — Encounter (INDEPENDENT_AMBULATORY_CARE_PROVIDER_SITE_OTHER): Payer: Self-pay | Admitting: Nurse Practitioner

## 2022-06-20 VITALS — BP 111/73 | HR 78 | Resp 18 | Ht 64.0 in | Wt 156.8 lb

## 2022-06-20 DIAGNOSIS — I739 Peripheral vascular disease, unspecified: Secondary | ICD-10-CM

## 2022-06-20 DIAGNOSIS — R252 Cramp and spasm: Secondary | ICD-10-CM

## 2022-06-20 DIAGNOSIS — E7800 Pure hypercholesterolemia, unspecified: Secondary | ICD-10-CM

## 2022-06-20 DIAGNOSIS — L819 Disorder of pigmentation, unspecified: Secondary | ICD-10-CM

## 2022-06-20 DIAGNOSIS — I10 Essential (primary) hypertension: Secondary | ICD-10-CM | POA: Diagnosis not present

## 2022-06-20 NOTE — Progress Notes (Signed)
Subjective:    Patient ID: Alyssa Mcclure, female    DOB: [birth year], 72 y.o.   MRN: [id number] Chief Complaint  Patient presents with   New Patient (Initial Visit)    np. ABI + consult. leg cramps + discoloration of skin of foot. referred by Alyssa Mcclure.    Alyssa Mcclure is a 72 year old female who presents today from her primary care provider, Dr. Merlinda Frederick in regards to evaluation for lower extremity circulation.  Patient has been having some pain and cramping in her calf area.  She notes that this pain and cramping is not consistent and it typically happens in the evening.  She denies any classic claudication-like symptoms with walking.  The patient notes that she walks about 3-4 blocks about 4 times daily and denies any pain in her lower extremities.  She also denies any weakness.  There are currently no open wounds or ulcerations.  She has no description of rest pain like symptoms.  Today the patient has right ABI of 1.22 a left of 1.13.  She has a TBI 0.58 on the right and 0.50 on the left.  She has triphasic tibial artery waveforms bilaterally with good toe waveforms bilaterally.    Review of Systems  All other systems reviewed and are negative.      Objective:   Physical Exam Vitals reviewed.  HENT:     Head: Normocephalic.  Cardiovascular:     Rate and Rhythm: Normal rate.     Pulses:          Dorsalis pedis pulses are 1+ on the right side and 1+ on the left side.       Posterior tibial pulses are 1+ on the right side and 1+ on the left side.  Pulmonary:     Effort: Pulmonary effort is normal.  Skin:    General: Skin is warm and dry.  Neurological:     Mental Status: She is alert and oriented to person, place, and time.  Psychiatric:        Mood and Affect: Mood normal.        Behavior: Behavior normal.        Thought Content: Thought content normal.        Judgment: Judgment normal.     BP 111/73 (BP Location: Right Arm)   Pulse 78   Resp 18   Ht 5'  4" (1.626 m)   Wt 156 lb 12.8 oz (71.1 kg)   BMI 26.91 kg/m   Past Medical History:  Diagnosis Date   Arthritis    neck and back and joints   B12 deficiency    Depression    GERD (gastroesophageal reflux disease)    H/O Bell's palsy    Heart murmur    causes no issues   HLD (hyperlipidemia)    HOH (hard of hearing)    bilateral   Hypertension    controlled on meds   Multiple thyroid nodules    Pre-diabetes    Sleep apnea    mild case per Dr.    Lelon Mast History   Socioeconomic History   Marital status: Divorced    Spouse name: Not on file   Number of children: Not on file   Years of education: Not on file   Highest education level: Not on file  Occupational History   Not on file  Tobacco Use   Smoking status: Former    Packs/day: 0.50    Years: 30.00    Additional  pack years: 0.00    Total pack years: 15.00    Types: Cigarettes    Quit date: 2005    Years since quitting: 19.4   Smokeless tobacco: Never   Tobacco comments:    quit 10 + years  Vaping Use   Vaping Use: Never used  Substance and Sexual Activity   Alcohol use: Yes    Alcohol/week: 1.0 standard drink of alcohol    Types: 1 Glasses of wine per week    Comment: occ   Drug use: No   Sexual activity: Not on file  Other Topics Concern   Not on file  Social History Narrative   Not on file   Social Determinants of Health   Financial Resource Strain: Not on file  Food Insecurity: Not on file  Transportation Needs: Not on file  Physical Activity: Not on file  Stress: Not on file  Social Connections: Not on file  Intimate Partner Violence: Not on file    Past Surgical History:  Procedure Laterality Date   ABDOMINAL HYSTERECTOMY     ABDOMINOPLASTY     BILATERAL CARPAL TUNNEL RELEASE Bilateral    bladder lift  2011   BROW LIFT Bilateral 06/12/2016   Procedure: BLEPHAROPLASTY UPPER EYELID WITH EXCESS SKIN;  Surgeon: Imagene Riches, MD;  Location: [facility];  Service: Ophthalmology;   Laterality: Bilateral;   CATARACT EXTRACTION W/PHACO Right 05/09/2015   Procedure: CATARACT EXTRACTION PHACO AND INTRAOCULAR LENS PLACEMENT (IOC);  Surgeon: Sallee Lange, MD;  Location: [facility];  Service: Ophthalmology;  Laterality: Right;  [place] 45.1 AP% 21.0 CDE 19.36 Fluid Pack Lot # [id number]   CATARACT EXTRACTION W/PHACO Left 03/28/2016   Procedure: CATARACT EXTRACTION PHACO AND INTRAOCULAR LENS PLACEMENT (IOC);  Surgeon: Sallee Lange, MD;  Location: [facility];  Service: Ophthalmology;  Laterality: Left;  [place] 01:05 AP% 21.3 CDE 24.21 fluid pack lot # [id number]   CESAREAN SECTION     ESOPHAGOGASTRODUODENOSCOPY (EGD) WITH PROPOFOL N/A 05/22/2019   Procedure: ESOPHAGOGASTRODUODENOSCOPY (EGD) WITH BIOPSY;  Surgeon: Pasty Spillers, MD;  Location: [facility];  Service: Endoscopy;  Laterality: N/A;   HERNIA REPAIR     KNEE ARTHROPLASTY Right 07/02/2018   Procedure: COMPUTER ASSISTED TOTAL KNEE ARTHROPLASTY RIGHT;  Surgeon: Donato Heinz, MD;  Location: [facility];  Service: Orthopedics;  Laterality: Right;   KNEE ARTHROPLASTY Left 11/12/2018   Procedure: COMPUTER ASSISTED TOTAL KNEE ARTHROPLASTY;  Surgeon: Donato Heinz, MD;  Location: [facility];  Service: Orthopedics;  Laterality: Left;   KNEE ARTHROSCOPY Bilateral    ROTATOR CUFF REPAIR Bilateral     Family History  Problem Relation Age of Onset   Hypertension Mother    Hyperlipidemia Mother    Heart disease Mother    Hyperlipidemia Father    Hypertension Father    Heart attack Father 50   Breast cancer Paternal Aunt    Breast cancer Daughter 50   Breast cancer Cousin        2 cousins 1 mat, 1 pat   Prostate cancer Neg Hx    Kidney cancer Neg Hx    Bladder Cancer Neg Hx     Allergies  Allergen Reactions   Penicillins Other (See Comments) and Hives    Other reaction(s): Other (See Comments) Has patient had a PCN reaction causing immediate rash, facial/tongue/throat swelling, SOB or lightheadedness with  hypotension: No Has patient had a PCN reaction causing severe rash involving mucus membranes or skin necrosis: No Has patient had a PCN  reaction that required hospitalization No Has patient had a PCN reaction occurring within the last 10 years: No If all of the above answers are "NO", then may proceed with Cephalosporin use.  blotches   Adhesive [Tape] Itching    Some bandaids   Losartan Cough    Patient states still on Losartan miscategorized   Statins Other (See Comments)    Leg cramps       Latest Ref Rng & Units 11/07/2018    9:14 AM 06/24/2018   11:36 AM 07/06/2009    5:25 AM  CBC  WBC 4.0 - 10.5 K/uL 6.5  6.7  7.0   Hemoglobin 12.0 - 15.0 g/dL 16.1  09.6  04.5 REPEATED TO VERIFY   Hematocrit 36.0 - 46.0 % 36.9  38.8  31.2   Platelets 150 - 400 K/uL 215  216  152 DELTA CHECK NOTED SPECIMEN CHECKED FOR CLOTS REPEATED TO VERIFY       CMP     Component Value Date/Time   NA 141 05/01/2021 1110   K CANCELED 05/01/2021 1110   CL 103 05/01/2021 1110   CO2 21 05/01/2021 1110   GLUCOSE CANCELED 05/01/2021 1110   GLUCOSE 133 (H) 11/07/2018 0914   BUN 14 05/01/2021 1110   CREATININE 0.74 05/01/2021 1110   CALCIUM 9.4 05/01/2021 1110   PROT 6.6 11/07/2018 0914   ALBUMIN 3.7 11/07/2018 0914   AST 19 11/07/2018 0914   ALT 21 11/07/2018 0914   ALKPHOS 103 11/07/2018 0914   BILITOT 0.5 11/07/2018 0914   EGFR 86 05/01/2021 1110   GFRNONAA >60 11/07/2018 0914     No results found.     Assessment & Plan:   1. PAD (peripheral artery disease) (HCC) The patient has some very mild evidence of PAD as evidenced by some diminished TBI's but otherwise there is no significant stenosis bilaterally.  Based on the noninvasive studies, no interventions recommended at this time.  We discussed different methods for treatment of lower extremity cramps.  Patient will follow-up annually.  2. Benign essential hypertension Continue antihypertensive medications as already ordered, these  medications have been reviewed and there are no changes at this time.  3. Pure hypercholesterolemia Continue statin as ordered and reviewed, no changes at this time   Current Outpatient Medications on File Prior to Visit  Medication Sig Dispense Refill   aspirin EC 81 MG tablet Take 81 mg by mouth daily. Swallow whole.     Biotin 1000 MCG tablet Take 1,000 mcg by mouth daily.     Cyanocobalamin (VITAMIN B 12 PO) Take 1,000 mcg by mouth daily.     DULoxetine (CYMBALTA) 60 MG capsule Take 60 mg by mouth every morning.      gabapentin (NEURONTIN) 300 MG capsule Take 300 mg by mouth as needed.     losartan (COZAAR) 100 MG tablet Take 100 mg by mouth every morning.      traZODone (DESYREL) 50 MG tablet Take 25 mg by mouth at bedtime as needed for sleep.      bisacodyl (BISACODYL) 5 MG EC tablet Take 2 tablets (10 mg total) by mouth daily as needed for moderate constipation. 30 tablet 0   Digestive Enzymes (DIGESTIVE ENZYME PO) Take 1 tablet by mouth daily. With prebiotic and probiotics     metoprolol tartrate (LOPRESSOR) 50 MG tablet Take 1 tablet (50 mg) 2 hours prior to your CT. 1 tablet 0   omeprazole (PRILOSEC) 20 MG capsule Take 1 capsule (20 mg total) by  mouth 2 (two) times daily before a meal. Take 1 capsule (20 MG total) by mouth two times a day for 30 days and then take 1 capsule (20 MG total) once a day for 30 days. 60 capsule 1   Probiotic Product (PROBIOTIC-10 PO) Take by mouth in the morning and at bedtime.     Psyllium (METAMUCIL FIBER) 51.7 % PACK Take 1 packet by mouth daily. 30 each 0   No current facility-administered medications on file prior to visit.    There are no Patient Instructions on file for this visit. No follow-ups on file.   Georgiana Spinner, NP

## 2022-06-21 ENCOUNTER — Encounter (INDEPENDENT_AMBULATORY_CARE_PROVIDER_SITE_OTHER): Payer: Self-pay | Admitting: Nurse Practitioner

## 2022-06-21 LAB — VAS US ABI WITH/WO TBI
Left ABI: 1.13
Right ABI: 1.22

## 2022-07-06 DIAGNOSIS — E538 Deficiency of other specified B group vitamins: Secondary | ICD-10-CM | POA: Diagnosis not present

## 2022-07-18 DIAGNOSIS — L821 Other seborrheic keratosis: Secondary | ICD-10-CM | POA: Diagnosis not present

## 2022-07-18 DIAGNOSIS — R233 Spontaneous ecchymoses: Secondary | ICD-10-CM | POA: Diagnosis not present

## 2022-08-06 DIAGNOSIS — E538 Deficiency of other specified B group vitamins: Secondary | ICD-10-CM | POA: Diagnosis not present

## 2022-08-21 NOTE — Progress Notes (Unsigned)
Date:  08/22/2022   ID:  [name], DOB 05/09/1950, MRN [id number]  Patient Location:  [address] [place] [address]   Provider location:   [facility], [place] office  PCP:  [name], MD  Cardiologist:  Hubbard Robinson [facility]  Chief Complaint  Patient presents with   Follow-up    Pt feels well. Meds reviewed.    History of Present Illness:    Alyssa Mcclure is a 72 y.o. 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 This was 41st percentile for age and sex matched control. Normal coronary origin with right dominance. Calcified plaque in the LAD and RCA causing minimal stenosis (<25%).  CAD-RADS 1. Minimal non-obstructive CAD (0-24%)  Lab work reviewed Total cholesterol 234 LDL 155 Numbers up from total cholesterol 182 A1c 5.6  Active , walks dog, weight down 10 pounds from clinic visit last year Recent travel to [place] to see gandson Has 3 grandchildren  EKG personally reviewed by myself on todays visit EKG Interpretation Date/Time:  Wednesday August 22 2022 09:10:37 EDT Ventricular Rate:  76 PR Interval:  172 QRS Duration:  76 QT Interval:  372 QTC Calculation: 418 R Axis:   31  Text Interpretation: Normal sinus rhythm Normal ECG When compared with ECG of 26-May-2019 08:51, No significant change was found Confirmed by Julien Nordmann [phone]) on 08/22/2022 9:17:12 AM   Past medical history reviewed Stress echocardiogram ordered for chronic low-grade chest  heaviness  performed at [facility] June 09, 2018 Read by Bethann Punches Exercise for 3 minutes 50 seconds Report indicating mild hypokinesis of the basal inferior wall Ejection fraction greater than 55% Achieved 6.3 METS Target heart rate achieved 160 bpm   Past Medical History:  Diagnosis Date   Arthritis    neck and back and joints   B12 deficiency    Depression    GERD (gastroesophageal reflux disease)    H/O Bell's palsy    Heart murmur    causes no issues   HLD (hyperlipidemia)    HOH (hard of hearing)    bilateral   Hypertension    controlled on meds   Multiple thyroid nodules    Pre-diabetes    Sleep apnea    mild case per Dr.   Past Surgical History:  Procedure Laterality Date   ABDOMINAL HYSTERECTOMY     ABDOMINOPLASTY     BILATERAL CARPAL TUNNEL RELEASE Bilateral    bladder lift  2011   BROW LIFT Bilateral 06/12/2016   Procedure: BLEPHAROPLASTY UPPER EYELID WITH EXCESS SKIN;  Surgeon: Imagene Riches, MD;  Location: [facility];  Service: Ophthalmology;  Laterality: Bilateral;   CATARACT EXTRACTION W/PHACO Right 05/09/2015   Procedure: CATARACT EXTRACTION PHACO AND INTRAOCULAR LENS PLACEMENT (IOC);  Surgeon: Sallee Lange, MD;  Location: [facility];  Service: Ophthalmology;  Laterality: Right;  [place] 45.1 AP% 21.0 CDE 19.36 Fluid Pack Lot # [id number]   CATARACT EXTRACTION W/PHACO Left 03/28/2016   Procedure: CATARACT EXTRACTION PHACO AND INTRAOCULAR LENS PLACEMENT (IOC);  Surgeon:  Sallee Lange, MD;  Location: [facility];  Service: Ophthalmology;  Laterality: Left;  [place] 01:05 AP% 21.3 CDE 24.21 fluid pack lot # [id number]   CESAREAN SECTION     ESOPHAGOGASTRODUODENOSCOPY (EGD) WITH PROPOFOL N/A 05/22/2019   Procedure: ESOPHAGOGASTRODUODENOSCOPY (EGD) WITH BIOPSY;  Surgeon: Pasty Spillers, MD;  Location: [facility];  Service: Endoscopy;  Laterality: N/A;   HERNIA REPAIR     KNEE ARTHROPLASTY Right 07/02/2018   Procedure: COMPUTER ASSISTED TOTAL  KNEE ARTHROPLASTY RIGHT;  Surgeon: Donato Heinz, MD;  Location: [facility];  Service: Orthopedics;  Laterality: Right;   KNEE ARTHROPLASTY Left 11/12/2018   Procedure: COMPUTER ASSISTED TOTAL KNEE ARTHROPLASTY;  Surgeon: Donato Heinz, MD;  Location: [facility];  Service: Orthopedics;  Laterality: Left;   KNEE ARTHROSCOPY Bilateral    ROTATOR CUFF REPAIR Bilateral      Current Meds  Medication Sig   aspirin EC 81 MG tablet Take 81 mg by mouth daily. Swallow whole.   Biotin 1000 MCG tablet Take 1,000 mcg by mouth daily.   Cyanocobalamin (VITAMIN B 12 PO) Take 1,000 mcg by mouth daily.   DULoxetine (CYMBALTA) 60 MG capsule Take 60 mg by mouth every morning.    gabapentin (NEURONTIN) 300 MG capsule Take 300 mg by mouth as needed.   losartan (COZAAR) 100 MG tablet Take 100 mg by mouth every morning.    omeprazole (PRILOSEC) 20 MG capsule Take 1 capsule (20 mg total) by mouth 2 (two) times daily before a meal. Take 1 capsule (20 MG total) by mouth two times a day for 30 days and then take 1 capsule (20 MG total) once a day for 30 days.   traZODone (DESYREL) 50 MG tablet Take 25 mg by mouth at bedtime as needed for sleep.      Allergies:   Penicillins, Adhesive [tape], Losartan, Proton pump inhibitors, Statins, and Latex   Social History   Tobacco Use   Smoking status: Former    Current packs/day: 0.00    Average packs/day: 0.5 packs/day for 30.0 years (15.0 ttl pk-yrs)    Types: Cigarettes    Start date: 54    Quit date: 2005    Years since quitting: 19.6   Smokeless tobacco: Never   Tobacco comments:    quit 10 + years  Vaping Use   Vaping status: Never Used  Substance Use Topics   Alcohol use: Yes    Alcohol/week: 1.0 standard drink of alcohol    Types: 1 Glasses of wine per week    Comment: occ   Drug use: No     Current Outpatient Medications on File Prior to Visit  Medication Sig Dispense Refill   aspirin EC 81 MG tablet Take 81 mg by mouth daily. Swallow whole.      Biotin 1000 MCG tablet Take 1,000 mcg by mouth daily.     Cyanocobalamin (VITAMIN B 12 PO) Take 1,000 mcg by mouth daily.     DULoxetine (CYMBALTA) 60 MG capsule Take 60 mg by mouth every morning.      gabapentin (NEURONTIN) 300 MG capsule Take 300 mg by mouth as needed.     losartan (COZAAR) 100 MG tablet Take 100 mg by mouth every morning.      omeprazole (PRILOSEC) 20 MG capsule Take 1 capsule (20 mg total) by mouth 2 (two) times daily before a meal. Take 1 capsule (20 MG total) by mouth two times a day for 30 days and then take 1 capsule (20  MG total) once a day for 30 days. 60 capsule 1   traZODone (DESYREL) 50 MG tablet Take 25 mg by mouth at bedtime as needed for sleep.      bisacodyl (BISACODYL) 5 MG EC tablet Take 2 tablets (10 mg total) by mouth daily as needed for moderate constipation. (Patient not taking: Reported on 08/22/2022) 30 tablet 0   Digestive Enzymes (DIGESTIVE ENZYME PO) Take 1 tablet by mouth daily. With prebiotic and probiotics (Patient not taking: Reported on 08/22/2022)     metoprolol tartrate (LOPRESSOR) 50 MG tablet Take 1 tablet (50 mg) 2 hours prior to your CT. (Patient not taking: Reported on 08/22/2022) 1 tablet 0   Probiotic Product (PROBIOTIC-10 PO) Take by mouth in the morning and at bedtime. (Patient not taking: Reported on 08/22/2022)     Psyllium (METAMUCIL FIBER) 51.7 % PACK Take 1 packet by mouth daily. (Patient not taking: Reported on 08/22/2022) 30 each 0   No current facility-administered medications on file prior to visit.     Family Hx: The patient's family history includes Breast cancer in her cousin and paternal aunt; Breast cancer (age of onset: 28) in her daughter; Heart attack (age of onset: 74) in her father; Heart disease in her mother; Hyperlipidemia in her father and mother; Hypertension in her father and mother. There is no history of Prostate cancer, Kidney cancer, or Bladder Cancer.  ROS:   Please see the history of present illness.    Review  of Systems  Constitutional: Negative.   HENT: Negative.    Respiratory: Negative.    Cardiovascular: Negative.   Gastrointestinal: Negative.   Musculoskeletal: Negative.   Neurological: Negative.   Psychiatric/Behavioral: Negative.    All other systems reviewed and are negative.    Labs/Other Tests and Data Reviewed:    Recent Labs: No results found for requested labs within last 365 days.   Recent Lipid Panel No results found for: "CHOL", "TRIG", "HDL", "CHOLHDL", "LDLCALC", "LDLDIRECT"  Wt Readings from Last 3 Encounters:  08/22/22 155 lb 6.4 oz (70.5 kg)  06/20/22 156 lb 12.8 oz (71.1 kg)  05/01/21 167 lb (75.8 kg)     Exam:    Vital Signs: Vital signs may also be detailed in the HPI BP (!) 147/86 (BP Location: Left Arm, Patient Position: Sitting, Cuff Size: Normal)   Pulse 76   Ht 5\' 4"  (1.626 m)   Wt 155 lb 6.4 oz (70.5 kg)   SpO2 96%   BMI 26.67 kg/m   Constitutional:  oriented to person, place, and time. No distress.  HENT:  Head: Grossly normal Eyes:  no discharge. No scleral icterus.  Neck: No JVD, no carotid bruits  Cardiovascular: Regular rate and rhythm, no murmurs appreciated Pulmonary/Chest: Clear to auscultation bilaterally, no wheezes or rails Abdominal: Soft.  no distension.  no tenderness.  Musculoskeletal: Normal range of motion Neurological:  normal muscle tone. Coordination normal. No atrophy Skin: Skin warm and dry Psychiatric: normal affect, pleasant   ASSESSMENT & PLAN:    Chest pain/angina Denies significant chest pain on exertion Prior workup last year including cardiac CTA showing nonobstructive coronary disease Discussed risk factors including hyperlipidemia, she would prefer not to be on a statin given history of statin myalgias We will start Zetia 10 mg daily   Benign essential hypertension Blood pressure running high, she will monitor blood pressure at home and call us with numbers, stay on losartan at current dose  Elevated  fasting blood sugar Weight trending downward, continue  walking program   Gastroesophageal reflux disease, esophagitis presence not specified Previously managed by GI  Pure hypercholesterolemia Total cholesterol 180 up to 220 Starting Zetia    Total encounter time more than 30 minutes  Greater than 50% was spent in counseling and coordination of care with the patient Question for you   Signed, Julien Nordmann, MD  08/22/2022 9:23 AM    [facility] [address], [place], [place] [address]

## 2022-08-22 ENCOUNTER — Ambulatory Visit: Payer: PPO | Attending: Cardiovascular Disease | Admitting: Cardiovascular Disease

## 2022-08-22 ENCOUNTER — Encounter: Payer: Self-pay | Admitting: Cardiovascular Disease

## 2022-08-22 VITALS — BP 147/86 | HR 76 | Ht 64.0 in | Wt 155.4 lb

## 2022-08-22 DIAGNOSIS — R0789 Other chest pain: Secondary | ICD-10-CM

## 2022-08-22 DIAGNOSIS — I10 Essential (primary) hypertension: Secondary | ICD-10-CM

## 2022-08-22 DIAGNOSIS — E782 Mixed hyperlipidemia: Secondary | ICD-10-CM

## 2022-08-22 MED ORDER — EZETIMIBE 10 MG PO TABS: 10.0000 mg | ORAL_TABLET | Freq: Every day | ORAL | 3 refills | Status: DC

## 2022-08-22 NOTE — Patient Instructions (Signed)
Medication Instructions:  Please start zetia 10 mg daily for cholesterol  Monitor blood pressure Goal is 130s on the top number  If you need a refill on your cardiac medications before your next appointment, please call your pharmacy.   Lab work: No new labs needed  Testing/Procedures: No new testing needed  Follow-Up: At [facility], you and your health needs are our priority.  As part of our continuing mission to provide you with exceptional heart care, we have created designated Provider Care Teams.  These Care Teams include your primary Cardiologist (physician) and Advanced Practice Providers (APPs -  Physician Assistants and Nurse Practitioners) who all work together to provide you with the care you need, when you need it.  You will need a follow up appointment in 12 months  Providers on your designated Care Team:   Nicolasa Ducking, NP Eula Listen, PA-C Cadence Fransico Michael, [place]  COVID-19 Vaccine Information can be found at: [email or web address] For questions related to vaccine distribution or appointments, please email [email or web address] or call [phone].

## 2022-08-27 DIAGNOSIS — M7541 Impingement syndrome of right shoulder: Secondary | ICD-10-CM | POA: Diagnosis not present

## 2022-08-27 DIAGNOSIS — M7581 Other shoulder lesions, right shoulder: Secondary | ICD-10-CM | POA: Diagnosis not present

## 2022-08-28 ENCOUNTER — Other Ambulatory Visit: Payer: Self-pay | Admitting: Surgery

## 2022-08-28 DIAGNOSIS — M7581 Other shoulder lesions, right shoulder: Secondary | ICD-10-CM

## 2022-08-28 DIAGNOSIS — M7541 Impingement syndrome of right shoulder: Secondary | ICD-10-CM

## 2022-08-29 ENCOUNTER — Ambulatory Visit
Admission: RE | Admit: 2022-08-29 | Discharge: 2022-08-29 | Disposition: A | Discharge status: Home / Self Care | Payer: PPO | Source: Ambulatory Visit | Attending: Surgery | Admitting: Surgery

## 2022-08-29 DIAGNOSIS — M7581 Other shoulder lesions, right shoulder: Secondary | ICD-10-CM | POA: Insufficient documentation

## 2022-08-29 DIAGNOSIS — M7541 Impingement syndrome of right shoulder: Secondary | ICD-10-CM | POA: Insufficient documentation

## 2022-08-29 DIAGNOSIS — M75111 Incomplete rotator cuff tear or rupture of right shoulder, not specified as traumatic: Secondary | ICD-10-CM | POA: Diagnosis not present

## 2022-09-06 DIAGNOSIS — R194 Change in bowel habit: Secondary | ICD-10-CM | POA: Diagnosis not present

## 2022-09-06 DIAGNOSIS — R1310 Dysphagia, unspecified: Secondary | ICD-10-CM | POA: Diagnosis not present

## 2022-09-06 DIAGNOSIS — K219 Gastro-esophageal reflux disease without esophagitis: Secondary | ICD-10-CM | POA: Diagnosis not present

## 2022-09-06 DIAGNOSIS — R9389 Abnormal findings on diagnostic imaging of other specified body structures: Secondary | ICD-10-CM | POA: Diagnosis not present

## 2022-09-06 DIAGNOSIS — R143 Flatulence: Secondary | ICD-10-CM | POA: Diagnosis not present

## 2022-09-06 DIAGNOSIS — E538 Deficiency of other specified B group vitamins: Secondary | ICD-10-CM | POA: Diagnosis not present

## 2022-09-06 DIAGNOSIS — K222 Esophageal obstruction: Secondary | ICD-10-CM | POA: Diagnosis not present

## 2022-09-06 DIAGNOSIS — R634 Abnormal weight loss: Secondary | ICD-10-CM | POA: Diagnosis not present

## 2022-09-07 ENCOUNTER — Other Ambulatory Visit: Payer: Self-pay | Admitting: Nurse Practitioner

## 2022-09-07 DIAGNOSIS — K219 Gastro-esophageal reflux disease without esophagitis: Secondary | ICD-10-CM

## 2022-09-12 ENCOUNTER — Ambulatory Visit
Admission: RE | Admit: 2022-09-12 | Discharge: 2022-09-12 | Disposition: A | Discharge status: Home / Self Care | Payer: PPO | Source: Ambulatory Visit | Attending: Nurse Practitioner | Admitting: Nurse Practitioner

## 2022-09-12 DIAGNOSIS — K219 Gastro-esophageal reflux disease without esophagitis: Secondary | ICD-10-CM | POA: Insufficient documentation

## 2022-09-12 DIAGNOSIS — K5730 Diverticulosis of large intestine without perforation or abscess without bleeding: Secondary | ICD-10-CM | POA: Diagnosis not present

## 2022-09-12 DIAGNOSIS — K449 Diaphragmatic hernia without obstruction or gangrene: Secondary | ICD-10-CM | POA: Diagnosis not present

## 2022-09-12 DIAGNOSIS — K2100 Gastro-esophageal reflux disease with esophagitis, without bleeding: Secondary | ICD-10-CM | POA: Diagnosis not present

## 2022-09-12 MED ORDER — IOHEXOL 300 MG/ML  SOLN
100.0000 mL | Freq: Once | INTRAMUSCULAR | Status: AC | PRN
  Administered 2022-09-12: 100 mL via INTRAVENOUS

## 2022-09-24 DIAGNOSIS — M7521 Bicipital tendinitis, right shoulder: Secondary | ICD-10-CM | POA: Diagnosis not present

## 2022-09-24 DIAGNOSIS — M7581 Other shoulder lesions, right shoulder: Secondary | ICD-10-CM | POA: Diagnosis not present

## 2022-09-24 DIAGNOSIS — M75121 Complete rotator cuff tear or rupture of right shoulder, not specified as traumatic: Secondary | ICD-10-CM | POA: Diagnosis not present

## 2022-09-24 DIAGNOSIS — M7541 Impingement syndrome of right shoulder: Secondary | ICD-10-CM | POA: Diagnosis not present

## 2022-10-02 ENCOUNTER — Other Ambulatory Visit: Payer: Self-pay | Admitting: Surgery

## 2022-10-08 ENCOUNTER — Encounter
Admission: RE | Admit: 2022-10-08 | Discharge: 2022-10-08 | Disposition: A | Discharge status: Home / Self Care | Payer: PPO | Source: Ambulatory Visit | Attending: Surgery | Admitting: Surgery

## 2022-10-08 ENCOUNTER — Other Ambulatory Visit: Payer: Self-pay

## 2022-10-08 HISTORY — DX: Bicipital tendinitis, right shoulder: M75.21

## 2022-10-08 HISTORY — DX: Peripheral vascular disease, unspecified: I73.9

## 2022-10-08 HISTORY — DX: Personal history of urinary calculi: Z87.442

## 2022-10-08 HISTORY — DX: Pneumonia, unspecified organism: J18.9

## 2022-10-08 HISTORY — DX: Other shoulder lesions, right shoulder: M75.81

## 2022-10-08 HISTORY — DX: Impingement syndrome of right shoulder: M75.41

## 2022-10-08 NOTE — Patient Instructions (Addendum)
Your procedure is scheduled on: 10/16/22 - Tuesday Report to the Registration Desk on the 1st floor of the [facility]. To find out your arrival time, please call [phone] between 1PM - 3PM on: 10/15/22 - Monday If your arrival time is 6:00 am, do not arrive before that time as the [facility] entrance doors do not open until 6:00 am.  REMEMBER: Instructions that are not followed completely may result in serious medical risk, up to and including death; or upon the discretion of your surgeon and anesthesiologist your surgery may need to be rescheduled.  Do not eat food after midnight the night before surgery.  No gum chewing or hard candies.  You may however, drink CLEAR liquids up to 2 hours before you are scheduled to arrive for your surgery. Do not drink anything within 2 hours of your scheduled arrival time.  Clear liquids include: - water  - apple juice without pulp - gatorade (not RED colors) - black coffee or tea (Do NOT add milk or creamers to the coffee or tea) Do NOT drink anything that is not on this list.  In addition, your doctor has ordered for you to drink the provided:  Ensure Pre-Surgery Clear Carbohydrate Drink  Drinking this carbohydrate drink up to two hours before surgery helps to reduce insulin resistance and improve patient outcomes. Please complete drinking 2 hours before scheduled arrival time.  One week prior to surgery:  Stop beginning 10/09/22, Anti-inflammatories (NSAIDS) such as Advil, Aleve, Ibuprofen, Motrin, Naproxen, Naprosyn and Aspirin based products such as Excedrin, Goody's Powder, BC Powder. You may however, continue to take Tylenol if needed for pain up until the day of surgery.  Stop beginning 10/09/22, ANY OVER THE COUNTER supplements until after surgery.  Continue taking all prescribed medications with the exception of the following:   1. Hold on the morning of losartan (COZAAR)    TAKE ONLY THESE MEDICATIONS THE MORNING OF SURGERY  WITH A SIP OF WATER:  omeprazole (PRILOSEC) - (take one the night before and one on the morning of surgery - helps to prevent nausea after surgery.) DULoxetine (CYMBALTA)    No Alcohol for 24 hours before or after surgery.  No Smoking including e-cigarettes for 24 hours before surgery.  No chewable tobacco products for at least 6 hours before surgery.  No nicotine patches on the day of surgery.  Do not use any "recreational" drugs for at least a week (preferably 2 weeks) before your surgery.  Please be advised that the combination of cocaine and anesthesia may have negative outcomes, up to and including death. If you test positive for cocaine, your surgery will be cancelled.  On the morning of surgery brush your teeth with toothpaste and water, you may rinse your mouth with mouthwash if you wish. Do not swallow any toothpaste or mouthwash.  Use CHG Soap or wipes as directed on instruction sheet.  Do not wear jewelry, make-up, hairpins, clips or nail polish.  For welded (permanent) jewelry: bracelets, anklets, waist bands, etc.  Please have this removed prior to surgery.  If it is not removed, there is a chance that hospital personnel will need to cut it off on the day of surgery.  Do not wear lotions, powders, or perfumes.   Do not shave body hair from the neck down 48 hours before surgery.  Contact lenses, hearing aids and dentures may not be worn into surgery.  Do not bring valuables to the hospital. [facility] is not responsible for  any missing/lost belongings or valuables.   Notify your doctor if there is any change in your medical condition (cold, fever, infection).  Wear comfortable clothing (specific to your surgery type) to the hospital.  After surgery, you can help prevent lung complications by doing breathing exercises.  Take deep breaths and cough every 1-2 hours. Your doctor may order a device called an Incentive Spirometer to help you take deep breaths. When  coughing or sneezing, hold a pillow firmly against your incision with both hands. This is called "splinting." Doing this helps protect your incision. It also decreases belly discomfort.  If you are being admitted to the hospital overnight, leave your suitcase in the car. After surgery it may be brought to your room.  In case of increased patient census, it may be necessary for you, the patient, to continue your postoperative care in the Same Day Surgery department.  If you are being discharged the day of surgery, you will not be allowed to drive home. You will need a responsible individual to drive you home and stay with you for 24 hours after surgery.   If you are taking public transportation, you will need to have a responsible individual with you.  Please call the Pre-admissions Testing Dept. at [phone] if you have any questions about these instructions.  Surgery Visitation Policy:  Patients having surgery or a procedure may have two visitors.  Children under the age of 29 must have an adult with them who is not the patient.  Inpatient Visitation:    Visiting hours are 7 a.m. to 8 p.m. Up to four visitors are allowed at one time in a patient room. The visitors may rotate out with other people during the day.  One visitor age 22 or older may stay with the patient overnight and must be in the room by 8 p.m.     Preparing for Surgery with CHLORHEXIDINE GLUCONATE (CHG) Soap  Chlorhexidine Gluconate (CHG) Soap  o An antiseptic cleaner that kills germs and bonds with the skin to continue killing germs even after washing  o Used for showering the night before surgery and morning of surgery  Before surgery, you can play an important role by reducing the number of germs on your skin.  CHG (Chlorhexidine gluconate) soap is an antiseptic cleanser which kills germs and bonds with the skin to continue killing germs even after washing.  Please do not use if you have an allergy to  CHG or antibacterial soaps. If your skin becomes reddened/irritated stop using the CHG.  1. Shower the NIGHT BEFORE SURGERY and the MORNING OF SURGERY with CHG soap.  2. If you choose to wash your hair, wash your hair first as usual with your normal shampoo.  3. After shampooing, rinse your hair and body thoroughly to remove the shampoo.  4. Use CHG as you would any other liquid soap. You can apply CHG directly to the skin and wash gently with a scrungie or a clean washcloth.  5. Apply the CHG soap to your body only from the neck down. Do not use on open wounds or open sores. Avoid contact with your eyes, ears, mouth, and genitals (private parts). Wash face and genitals (private parts) with your normal soap.  6. Wash thoroughly, paying special attention to the area where your surgery will be performed.  7. Thoroughly rinse your body with warm water.  8. Do not shower/wash with your normal soap after using and rinsing off the CHG soap.  9. Pat yourself dry with a clean towel.  10. Wear clean pajamas to bed the night before surgery.  12. Place clean sheets on your bed the night of your first shower and do not sleep with pets.  13. Shower again with the CHG soap on the day of surgery prior to arriving at the hospital.  14. Do not apply any deodorants/lotions/powders.  15. Please wear clean clothes to the hospital.  How to Use an Incentive Spirometer  An incentive spirometer is a tool that measures how well you are filling your lungs with each breath. Learning to take long, deep breaths using this tool can help you keep your lungs clear and active. This may help to reverse or lessen your chance of developing breathing (pulmonary) problems, especially infection. You may be asked to use a spirometer: After a surgery. If you have a lung problem or a history of smoking. After a long period of time when you have been unable to move or be active. If the spirometer includes an indicator to  show the highest number that you have reached, your health care provider or respiratory therapist will help you set a goal. Keep a log of your progress as told by your health care provider. What are the risks? Breathing too quickly may cause dizziness or cause you to pass out. Take your time so you do not get dizzy or light-headed. If you are in pain, you may need to take pain medicine before doing incentive spirometry. It is harder to take a deep breath if you are having pain. How to use your incentive spirometer  Sit up on the edge of your bed or on a chair. Hold the incentive spirometer so that it is in an upright position. Before you use the spirometer, breathe out normally. Place the mouthpiece in your mouth. Make sure your lips are closed tightly around it. Breathe in slowly and as deeply as you can through your mouth, causing the piston or the ball to rise toward the top of the chamber. Hold your breath for 3-5 seconds, or for as long as possible. If the spirometer includes a coach indicator, use this to guide you in breathing. Slow down your breathing if the indicator goes above the marked areas. Remove the mouthpiece from your mouth and breathe out normally. The piston or ball will return to the bottom of the chamber. Rest for a few seconds, then repeat the steps 10 or more times. Take your time and take a few normal breaths between deep breaths so that you do not get dizzy or light-headed. Do this every 1-2 hours when you are awake. If the spirometer includes a goal marker to show the highest number you have reached (best effort), use this as a goal to work toward during each repetition. After each set of 10 deep breaths, cough a few times. This will help to make sure that your lungs are clear. If you have an incision on your chest or abdomen from surgery, place a pillow or a rolled-up towel firmly against the incision when you cough. This can help to reduce pain while taking deep  breaths and coughing. General tips When you are able to get out of bed: Walk around often. Continue to take deep breaths and cough in order to clear your lungs. Keep using the incentive spirometer until your health care provider says it is okay to stop using it. If you have been in the hospital, you may be told to keep  using the spirometer at home. Contact a health care provider if: You are having difficulty using the spirometer. You have trouble using the spirometer as often as instructed. Your pain medicine is not giving enough relief for you to use the spirometer as told. You have a fever. Get help right away if: You develop shortness of breath. You develop a cough with bloody mucus from the lungs. You have fluid or blood coming from an incision site after you cough. Summary An incentive spirometer is a tool that can help you learn to take long, deep breaths to keep your lungs clear and active. You may be asked to use a spirometer after a surgery, if you have a lung problem or a history of smoking, or if you have been inactive for a long period of time. Use your incentive spirometer as instructed every 1-2 hours while you are awake. If you have an incision on your chest or abdomen, place a pillow or a rolled-up towel firmly against your incision when you cough. This will help to reduce pain. Get help right away if you have shortness of breath, you cough up bloody mucus, or blood comes from your incision when you cough. This information is not intended to replace advice given to you by your health care provider. Make sure you discuss any questions you have with your health care provider. Document Revised: 03/16/2019 Document Reviewed: 03/16/2019 Elsevier Patient Education  2023 [employer].

## 2022-10-10 DIAGNOSIS — E538 Deficiency of other specified B group vitamins: Secondary | ICD-10-CM | POA: Diagnosis not present

## 2022-10-15 MED ORDER — CEFAZOLIN SODIUM-DEXTROSE 2-4 GM/100ML-% IV SOLN
2.0000 g | INTRAVENOUS | Status: AC
  Administered 2022-10-16: 2 g via INTRAVENOUS

## 2022-10-16 ENCOUNTER — Encounter: Payer: Self-pay | Admitting: Surgery

## 2022-10-16 ENCOUNTER — Ambulatory Visit
Admission: RE | Admit: 2022-10-16 | Discharge: 2022-10-16 | Disposition: A | Discharge status: Home / Self Care | Payer: PPO | Source: Ambulatory Visit | Attending: Surgery | Admitting: Surgery

## 2022-10-16 ENCOUNTER — Ambulatory Visit: Payer: Self-pay

## 2022-10-16 ENCOUNTER — Other Ambulatory Visit: Payer: Self-pay

## 2022-10-16 ENCOUNTER — Ambulatory Visit: Payer: PPO

## 2022-10-16 ENCOUNTER — Ambulatory Visit: Payer: PPO | Admitting: Urgent Care

## 2022-10-16 ENCOUNTER — Encounter
Admission: RE | Disposition: A | Discharge status: Home / Self Care | Payer: Self-pay | Source: Ambulatory Visit | Attending: Surgery

## 2022-10-16 DIAGNOSIS — M7541 Impingement syndrome of right shoulder: Secondary | ICD-10-CM | POA: Insufficient documentation

## 2022-10-16 DIAGNOSIS — M779 Enthesopathy, unspecified: Secondary | ICD-10-CM | POA: Insufficient documentation

## 2022-10-16 DIAGNOSIS — M19011 Primary osteoarthritis, right shoulder: Secondary | ICD-10-CM | POA: Insufficient documentation

## 2022-10-16 DIAGNOSIS — G4733 Obstructive sleep apnea (adult) (pediatric): Secondary | ICD-10-CM | POA: Diagnosis not present

## 2022-10-16 DIAGNOSIS — G8929 Other chronic pain: Secondary | ICD-10-CM | POA: Insufficient documentation

## 2022-10-16 DIAGNOSIS — M24111 Other articular cartilage disorders, right shoulder: Secondary | ICD-10-CM | POA: Diagnosis not present

## 2022-10-16 DIAGNOSIS — M75121 Complete rotator cuff tear or rupture of right shoulder, not specified as traumatic: Secondary | ICD-10-CM | POA: Diagnosis not present

## 2022-10-16 DIAGNOSIS — M810 Age-related osteoporosis without current pathological fracture: Secondary | ICD-10-CM | POA: Insufficient documentation

## 2022-10-16 DIAGNOSIS — Z87891 Personal history of nicotine dependence: Secondary | ICD-10-CM | POA: Insufficient documentation

## 2022-10-16 DIAGNOSIS — M7521 Bicipital tendinitis, right shoulder: Secondary | ICD-10-CM | POA: Diagnosis not present

## 2022-10-16 DIAGNOSIS — I10 Essential (primary) hypertension: Secondary | ICD-10-CM | POA: Insufficient documentation

## 2022-10-16 DIAGNOSIS — K219 Gastro-esophageal reflux disease without esophagitis: Secondary | ICD-10-CM | POA: Diagnosis not present

## 2022-10-16 DIAGNOSIS — Z96653 Presence of artificial knee joint, bilateral: Secondary | ICD-10-CM | POA: Insufficient documentation

## 2022-10-16 DIAGNOSIS — M7581 Other shoulder lesions, right shoulder: Secondary | ICD-10-CM | POA: Diagnosis not present

## 2022-10-16 DIAGNOSIS — G8918 Other acute postprocedural pain: Secondary | ICD-10-CM | POA: Diagnosis not present

## 2022-10-16 HISTORY — PX: SHOULDER ARTHROSCOPY WITH SUBACROMIAL DECOMPRESSION, ROTATOR CUFF REPAIR AND BICEP TENDON REPAIR: SHX5687

## 2022-10-16 SURGERY — SHOULDER ARTHROSCOPY WITH SUBACROMIAL DECOMPRESSION, ROTATOR CUFF REPAIR AND BICEP TENDON REPAIR
Anesthesia: General | Site: Shoulder | Laterality: Right

## 2022-10-16 MED ORDER — EPINEPHRINE PF 1 MG/ML IJ SOLN
INTRAMUSCULAR | Status: AC
  Filled 2022-10-16: qty 1

## 2022-10-16 MED ORDER — DEXAMETHASONE SODIUM PHOSPHATE 10 MG/ML IJ SOLN
INTRAMUSCULAR | Status: AC
  Filled 2022-10-16: qty 1

## 2022-10-16 MED ORDER — DEXAMETHASONE SODIUM PHOSPHATE 10 MG/ML IJ SOLN
INTRAMUSCULAR | Status: DC | PRN
  Administered 2022-10-16: 10 mg via INTRAVENOUS

## 2022-10-16 MED ORDER — FENTANYL CITRATE (PF) 100 MCG/2ML IJ SOLN
INTRAMUSCULAR | Status: DC | PRN
  Administered 2022-10-16 (×2): 50 ug via INTRAVENOUS

## 2022-10-16 MED ORDER — HYDROCODONE-ACETAMINOPHEN 5-325 MG PO TABS
1.0000 | ORAL_TABLET | Freq: Four times a day (QID) | ORAL | 0 refills | Status: AC | PRN
Start: 2022-10-16 — End: 2023-10-16

## 2022-10-16 MED ORDER — BUPIVACAINE LIPOSOME 1.3 % IJ SUSP
INTRAMUSCULAR | Status: DC | PRN
Start: 2022-10-16 — End: 2022-10-16
  Administered 2022-10-16: 10 mL

## 2022-10-16 MED ORDER — FENTANYL CITRATE (PF) 100 MCG/2ML IJ SOLN: 25.0000 ug | INTRAMUSCULAR | Status: DC | PRN

## 2022-10-16 MED ORDER — ONDANSETRON HCL 4 MG/2ML IJ SOLN
INTRAMUSCULAR | Status: DC | PRN
  Administered 2022-10-16: 4 mg via INTRAVENOUS

## 2022-10-16 MED ORDER — OXYCODONE HCL 5 MG PO TABS: 5.0000 mg | ORAL_TABLET | Freq: Once | ORAL | Status: DC | PRN

## 2022-10-16 MED ORDER — LACTATED RINGERS IV SOLN: INTRAVENOUS | Status: DC | PRN

## 2022-10-16 MED ORDER — PHENYLEPHRINE HCL-NACL 20-0.9 MG/250ML-% IV SOLN
INTRAVENOUS | Status: DC | PRN
Start: 2022-10-16 — End: 2022-10-16
  Administered 2022-10-16: 10 ug/min via INTRAVENOUS

## 2022-10-16 MED ORDER — KETOROLAC TROMETHAMINE 15 MG/ML IJ SOLN
INTRAMUSCULAR | Status: AC
  Filled 2022-10-16: qty 1

## 2022-10-16 MED ORDER — OXYCODONE HCL 5 MG/5ML PO SOLN: 5.0000 mg | Freq: Once | ORAL | Status: DC | PRN

## 2022-10-16 MED ORDER — PROPOFOL 1000 MG/100ML IV EMUL
INTRAVENOUS | Status: AC
  Filled 2022-10-16: qty 100

## 2022-10-16 MED ORDER — KETOROLAC TROMETHAMINE 30 MG/ML IJ SOLN
INTRAMUSCULAR | Status: AC
  Filled 2022-10-16: qty 1

## 2022-10-16 MED ORDER — RINGERS IRRIGATION IR SOLN
Status: DC | PRN
  Administered 2022-10-16: 3000 mL

## 2022-10-16 MED ORDER — OXYCODONE HCL 5 MG PO TABS: 5.0000 mg | ORAL_TABLET | ORAL | Status: DC | PRN

## 2022-10-16 MED ORDER — BUPIVACAINE HCL (PF) 0.5 % IJ SOLN
INTRAMUSCULAR | Status: DC | PRN
Start: 2022-10-16 — End: 2022-10-16
  Administered 2022-10-16: 10 mL

## 2022-10-16 MED ORDER — DEXMEDETOMIDINE HCL IN NACL 200 MCG/50ML IV SOLN
INTRAVENOUS | Status: DC | PRN
Start: 2022-10-16 — End: 2022-10-16
  Administered 2022-10-16: 12 ug via INTRAVENOUS

## 2022-10-16 MED ORDER — LIDOCAINE HCL (CARDIAC) PF 100 MG/5ML IV SOSY
PREFILLED_SYRINGE | INTRAVENOUS | Status: DC | PRN
  Administered 2022-10-16: 80 mg via INTRAVENOUS

## 2022-10-16 MED ORDER — ACETAMINOPHEN 10 MG/ML IV SOLN
INTRAVENOUS | Status: AC
  Filled 2022-10-16: qty 100

## 2022-10-16 MED ORDER — ROCURONIUM BROMIDE 100 MG/10ML IV SOLN
INTRAVENOUS | Status: DC | PRN
  Administered 2022-10-16: 20 mg via INTRAVENOUS
  Administered 2022-10-16: 50 mg via INTRAVENOUS

## 2022-10-16 MED ORDER — LIDOCAINE HCL (PF) 1 % IJ SOLN
INTRAMUSCULAR | Status: AC
  Filled 2022-10-16: qty 5

## 2022-10-16 MED ORDER — BUPIVACAINE LIPOSOME 1.3 % IJ SUSP
INTRAMUSCULAR | Status: AC
  Filled 2022-10-16: qty 20

## 2022-10-16 MED ORDER — LIDOCAINE HCL (PF) 1 % IJ SOLN
INTRAMUSCULAR | Status: DC | PRN
Start: 2022-10-16 — End: 2022-10-16
  Administered 2022-10-16: 3 mL

## 2022-10-16 MED ORDER — ROCURONIUM BROMIDE 10 MG/ML (PF) SYRINGE
PREFILLED_SYRINGE | INTRAVENOUS | Status: AC
  Filled 2022-10-16: qty 10

## 2022-10-16 MED ORDER — BUPIVACAINE-EPINEPHRINE 0.5% -1:200000 IJ SOLN
INTRAMUSCULAR | Status: DC | PRN
  Administered 2022-10-16: 30 mL

## 2022-10-16 MED ORDER — FENTANYL CITRATE (PF) 100 MCG/2ML IJ SOLN
INTRAMUSCULAR | Status: AC
  Filled 2022-10-16: qty 2

## 2022-10-16 MED ORDER — PROPOFOL 10 MG/ML IV BOLUS
INTRAVENOUS | Status: AC
  Filled 2022-10-16: qty 20

## 2022-10-16 MED ORDER — LACTATED RINGERS IR SOLN
Status: DC | PRN
  Administered 2022-10-16: 3001 mL

## 2022-10-16 MED ORDER — FENTANYL CITRATE PF 50 MCG/ML IJ SOSY
PREFILLED_SYRINGE | INTRAMUSCULAR | Status: AC
  Filled 2022-10-16: qty 1

## 2022-10-16 MED ORDER — BUPIVACAINE-EPINEPHRINE (PF) 0.5% -1:200000 IJ SOLN
INTRAMUSCULAR | Status: AC
  Filled 2022-10-16: qty 30

## 2022-10-16 MED ORDER — CEFAZOLIN SODIUM-DEXTROSE 2-4 GM/100ML-% IV SOLN
INTRAVENOUS | Status: AC
  Filled 2022-10-16: qty 100

## 2022-10-16 MED ORDER — FENTANYL CITRATE PF 50 MCG/ML IJ SOSY
50.0000 ug | PREFILLED_SYRINGE | Freq: Once | INTRAMUSCULAR | Status: AC
  Administered 2022-10-16: 50 ug via INTRAVENOUS

## 2022-10-16 MED ORDER — KETOROLAC TROMETHAMINE 15 MG/ML IJ SOLN
15.0000 mg | Freq: Once | INTRAMUSCULAR | Status: AC
  Administered 2022-10-16: 15 mg via INTRAVENOUS

## 2022-10-16 MED ORDER — ACETAMINOPHEN 10 MG/ML IV SOLN
INTRAVENOUS | Status: DC | PRN
  Administered 2022-10-16: 1000 mg via INTRAVENOUS

## 2022-10-16 MED ORDER — PHENYLEPHRINE HCL-NACL 20-0.9 MG/250ML-% IV SOLN
INTRAVENOUS | Status: AC
  Filled 2022-10-16: qty 250

## 2022-10-16 MED ORDER — SEVOFLURANE IN SOLN
RESPIRATORY_TRACT | Status: AC
  Filled 2022-10-16: qty 250

## 2022-10-16 MED ORDER — PROPOFOL 10 MG/ML IV BOLUS
INTRAVENOUS | Status: DC | PRN
  Administered 2022-10-16: 170 mg via INTRAVENOUS

## 2022-10-16 MED ORDER — SUGAMMADEX SODIUM 200 MG/2ML IV SOLN
INTRAVENOUS | Status: DC | PRN
  Administered 2022-10-16: 200 mg via INTRAVENOUS

## 2022-10-16 MED ORDER — ONDANSETRON HCL 4 MG/2ML IJ SOLN
INTRAMUSCULAR | Status: AC
  Filled 2022-10-16: qty 2

## 2022-10-16 SURGICAL SUPPLY — 55 items
ANCH SUT 2 2.9 2 LD TPR NDL (Anchor) ×1 IMPLANT
ANCH SUT 2 2/0 ABS BRD STRL (Anchor) ×2 IMPLANT
ANCH SUT 5.5 KNTLS (Anchor) ×2 IMPLANT
ANCHOR HEALICOIL REGEN 5.5 (Anchor) IMPLANT
ANCHOR JUGGERKNOT WTAP NDL 2.9 (Anchor) IMPLANT
ANCHOR QFIX 2.8 SUT MINI TAPE (Anchor) IMPLANT
ANCHOR SUT W/ ORTHOCORD (Anchor) IMPLANT
APL PRP STRL LF DISP 70% ISPRP (MISCELLANEOUS) ×1
BIT DRILL JUGRKNT W/NDL BIT2.9 (DRILL) IMPLANT
BLADE FULL RADIUS 3.5 (BLADE) ×1 IMPLANT
BUR ACROMIONIZER 4.0 (BURR) ×1 IMPLANT
CHLORAPREP W/TINT 26 (MISCELLANEOUS) ×1 IMPLANT
COVER MAYO STAND STRL (DRAPES) ×1 IMPLANT
DILATOR 5.5 THREADED HEALICOIL (MISCELLANEOUS) IMPLANT
DRILL JUGGERKNOT W/NDL BIT 2.9 (DRILL) ×1
ELECT CAUTERY BLADE 6.4 (BLADE) ×1 IMPLANT
ELECT REM PT RETURN 9FT ADLT (ELECTROSURGICAL) ×1
ELECTRODE REM PT RTRN 9FT ADLT (ELECTROSURGICAL) ×1 IMPLANT
GAUZE SPONGE 4X4 12PLY STRL (GAUZE/BANDAGES/DRESSINGS) ×1 IMPLANT
GAUZE XEROFORM 1X8 LF (GAUZE/BANDAGES/DRESSINGS) ×1 IMPLANT
GLOVE BIO SURGEON STRL SZ7.5 (GLOVE) ×2 IMPLANT
GLOVE BIO SURGEON STRL SZ8 (GLOVE) ×2 IMPLANT
GLOVE BIOGEL PI IND STRL 8 (GLOVE) ×1 IMPLANT
GLOVE INDICATOR 8.0 STRL GRN (GLOVE) ×1 IMPLANT
GOWN STRL REUS W/ TWL LRG LVL3 (GOWN DISPOSABLE) ×1 IMPLANT
GOWN STRL REUS W/ TWL XL LVL3 (GOWN DISPOSABLE) ×1 IMPLANT
GOWN STRL REUS W/TWL LRG LVL3 (GOWN DISPOSABLE) ×1
GOWN STRL REUS W/TWL XL LVL3 (GOWN DISPOSABLE) ×1
GRASPER SUT 15 45D LOW PRO (SUTURE) IMPLANT
IV LACTATED RINGER IRRG 3000ML (IV SOLUTION) ×2
IV LR IRRIG 3000ML ARTHROMATIC (IV SOLUTION) ×2 IMPLANT
KIT CANNULA 8X76-LX IN CANNULA (CANNULA) ×1 IMPLANT
KIT SUTURE 2.8 Q-FIX DISP (MISCELLANEOUS) IMPLANT
MANIFOLD NEPTUNE II (INSTRUMENTS) ×2 IMPLANT
MASK FACE SPIDER DISP (MASK) ×1 IMPLANT
MAT ABSORB FLUID 56X50 GRAY (MISCELLANEOUS) ×1 IMPLANT
PACK ARTHROSCOPY SHOULDER (MISCELLANEOUS) ×1 IMPLANT
PAD ABD DERMACEA PRESS 5X9 (GAUZE/BANDAGES/DRESSINGS) ×2 IMPLANT
PASSER SUT FIRSTPASS SELF (INSTRUMENTS) IMPLANT
SLEEVE REMOTE CONTROL 5X12 (DRAPES) IMPLANT
SLING ARM LRG DEEP (SOFTGOODS) ×1 IMPLANT
SLING ULTRA II LG (MISCELLANEOUS) ×1 IMPLANT
SPONGE T-LAP 18X18 ~~LOC~~+RFID (SPONGE) ×1 IMPLANT
STAPLER SKIN PROX 35W (STAPLE) ×1 IMPLANT
STRAP SAFETY 5IN WIDE (MISCELLANEOUS) ×1 IMPLANT
SUT ETHIBOND 0 MO6 C/R (SUTURE) ×1 IMPLANT
SUT ULTRABRAID 2 COBRAID 38 (SUTURE) IMPLANT
SUT VIC AB 2-0 CT1 27 (SUTURE) ×2
SUT VIC AB 2-0 CT1 TAPERPNT 27 (SUTURE) ×2 IMPLANT
TAPE MICROFOAM 4IN (TAPE) ×1 IMPLANT
TRAP FLUID SMOKE EVACUATOR (MISCELLANEOUS) ×1 IMPLANT
TUBE SET DOUBLEFLO INFLOW (TUBING) ×1 IMPLANT
TUBING CONNECTING 10 (TUBING) ×1 IMPLANT
WAND WEREWOLF FLOW 90D (MISCELLANEOUS) ×1 IMPLANT
WATER STERILE IRR 500ML POUR (IV SOLUTION) ×1 IMPLANT

## 2022-10-16 NOTE — Transfer of Care (Signed)
Immediate Anesthesia Transfer of Care Note  Patient: Alyssa Mcclure  Procedure(s) Performed: RIGHT SHOULDER ARTHROSCOPY WITH DEBRIDEMENT, DECOMPRESSION, ROTATOR CUFF REPAIR, AND BICEPS TENODESIS. (Right: Shoulder)  Patient Location: PACU  Anesthesia Type:General  Level of Consciousness: drowsy and responds to stimulation  Airway & Oxygen Therapy: Patient Spontanous Breathing and Patient connected to face mask oxygen  Post-op Assessment: Report given to RN and Post -op Vital signs reviewed and stable  Post vital signs: Reviewed and stable  Last Vitals:  Vitals Value Taken Time  BP    Temp    Pulse 47 10/16/22 1541  Resp 13 10/16/22 1541  SpO2 98 % 10/16/22 1541  Vitals shown include unfiled device data.  Last Pain:  Vitals:   10/16/22 1213  TempSrc: Temporal      Patients Stated Pain Goal: 0 (10/16/22 1213)  Complications: No notable events documented.

## 2022-10-16 NOTE — Discharge Instructions (Addendum)
Orthopedic discharge instructions: Keep dressing dry and intact.  May shower after dressing changed on post-op day #4 (Saturday).  Cover staples with Band-Aids after drying off. Apply ice frequently to shoulder. Take ibuprofen 600-800 mg TID with meals for 3-5 days, then as necessary. Take pain medication as prescribed or ES Tylenol if necessary. May resume daily aspirin tomorrow morning. Keep shoulder immobilizer on at all times except may remove for bathing purposes. Follow-up in 10-14 days or as scheduled.  AMBULATORY SURGERY  DISCHARGE INSTRUCTIONS   The drugs that you were given will stay in your system until tomorrow so for the next 24 hours you should not:  Drive an automobile Make any legal decisions Drink any alcoholic beverage   You may resume regular meals tomorrow.  Today it is better to start with liquids and gradually work up to solid foods.  You may eat anything you prefer, but it is better to start with liquids, then soup and crackers, and gradually work up to solid foods.   Please notify your doctor immediately if you have any unusual bleeding, trouble breathing, redness and pain at the surgery site, drainage, fever, or pain not relieved by medication.    Additional Instructions: PLEASE LEAVE EXPAREL (TEAL) ARMBAND ON FOR 4 DAYS     Please contact your physician with any problems or Same Day Surgery at [phone], Monday through Friday 6 am to 4 pm, or [place] at [facility] number at [phone].     Interscalene Nerve Block with Exparel   For your surgery you have received an Interscalene Nerve Block with Exparel. Nerve Blocks affect many types of nerves, including nerves that control movement, pain and normal sensation.  You may experience feelings such as numbness, tingling, heaviness, weakness or the inability to move your arm or the feeling or sensation that your arm has "fallen asleep". A nerve block with Exparel can last up to 5 days.   Usually the weakness wears off first.  The tingling and heaviness usually wear off next.  Finally you may start to notice pain.  Keep in mind that this may occur in any order.  Once a nerve block starts to wear off it is usually completely gone within 60 minutes. ISNB may cause mild shortness of breath, a hoarse voice, blurry vision, unequal pupils, or drooping of the face on the same side as the nerve block.  These symptoms will usually resolve with the numbness.  Very rarely the procedure itself can cause mild seizures. If needed, your surgeon will give you a prescription for pain medication.  It will take about 60 minutes for the oral pain medication to become fully effective.  So, it is recommended that you start taking this medication before the nerve block first begins to wear off, or when you first begin to feel discomfort. Take your pain medication only as prescribed.  Pain medication can cause sedation and decrease your breathing if you take more than you need for the level of pain that you have. Nausea is a common side effect of many pain medications.  You may want to eat something before taking your pain medicine to prevent nausea. After an Interscalene nerve block, you cannot feel pain, pressure or extremes in temperature in the effected arm.  Because your arm is numb it is at an increased risk for injury.  To decrease the possibility of injury, please practice the following:  While you are awake change the position of your arm frequently to prevent too  much pressure on any one area for prolonged periods of time.  If you have a cast or tight dressing, check the color or your fingers every couple of hours.  Call your surgeon with the appearance of any discoloration (white or blue). If you are given a sling to wear before you go home, please wear it  at all times until the block has completely worn off.  Do not get up at night without your sling. Please contact [facility] or your surgeon if  you do not begin to regain sensation after 7 days from the surgery.  Anesthesia may be contacted by calling the Same Day Surgery Department, Mon. through Fri., 6 am to 4 pm at [phone].   If you experience any other problems or concerns, please contact your surgeon's office. If you experience severe or prolonged shortness of breath go to the nearest emergency department. SHOULDER SLING IMMOBILIZER   VIDEO Slingshot 2 Shoulder Brace Application - YouTube ---[email or web address]/  INSTRUCTIONS While supporting the injured arm, slide the forearm into the sling. Wrap the adjustable shoulder strap around the neck and shoulders and attach the strap end to the sling using  the "alligator strap tab."  Adjust the shoulder strap to the required length. Position the shoulder pad behind the neck. To secure the shoulder pad location (optional), pull the shoulder strap away from the shoulder pad, unfold the hook material on the top of the pad, then press the shoulder strap back onto the hook material to secure the pad in place. Attach the closure strap across the open top of the sling. Position the strap so that it holds the arm securely in the sling. Next, attach the thumb strap to the open end of the sling between the thumb and fingers. After sling has been fit, it may be easily removed and reapplied using the quick release buckle on shoulder strap. If a neutral pillow or 15 abduction pillow is included, place the pillow at the waistline. Attach the sling to the pillow, lining up hook material on the pillow with the loop on sling. Adjust the waist strap to fit.  If waist strap is too long, cut it to fit. Use the small piece of double sided hook material (located on top of the pillow) to secure the strap end. Place the double sided hook material on the inside of the cut strap end and secure it to the waist strap.     If no pillow is included, attach the waist strap to the sling and  adjust to fit.    Washing Instructions: Straps and sling must be removed and cleaned regularly depending on your activity level and perspiration. Hand wash straps and sling in cold water with mild detergent, rinse, air dry

## 2022-10-16 NOTE — Anesthesia Preprocedure Evaluation (Signed)
Anesthesia Evaluation  Patient identified by MRN, date of birth, ID band Patient awake    Reviewed: Allergy & Precautions, NPO status , Patient's Chart, lab work & pertinent test results  History of Anesthesia Complications Negative for: history of anesthetic complications  Airway Mallampati: III  TM Distance: >3 FB Neck ROM: full    Dental  (+) Chipped   Pulmonary sleep apnea , former smoker   Pulmonary exam normal        Cardiovascular hypertension, On Medications + CAD and + Peripheral Vascular Disease  Normal cardiovascular exam     Neuro/Psych  PSYCHIATRIC DISORDERS  Depression    negative neurological ROS     GI/Hepatic Neg liver ROS,GERD  Controlled,,  Endo/Other  negative endocrine ROS    Renal/GU      Musculoskeletal   Abdominal   Peds  Hematology negative hematology ROS (+)   Anesthesia Other Findings Past Medical History: No date: Arthritis     Comment:  neck and back and joints No date: B12 deficiency No date: Depression No date: GERD (gastroesophageal reflux disease) No date: H/O Bell's palsy No date: Heart murmur     Comment:  causes no issues No date: History of kidney stones No date: HLD (hyperlipidemia) No date: HOH (hard of hearing)     Comment:  bilateral No date: Hypertension     Comment:  controlled on meds No date: Impingement syndrome of shoulder, right No date: Multiple thyroid nodules No date: Peripheral vascular disease (HCC) No date: Pneumonia No date: Pre-diabetes No date: Rotator cuff tendinitis, right No date: Sleep apnea     Comment:  mild case per Dr. patient staes that she does not have               this No date: Tendinitis of upper biceps tendon of right shoulder  Past Surgical History: No date: ABDOMINAL HYSTERECTOMY No date: ABDOMINOPLASTY No date: BILATERAL CARPAL TUNNEL RELEASE; Bilateral 2011: bladder lift 06/12/2016: BROW LIFT; Bilateral     Comment:   Procedure: BLEPHAROPLASTY UPPER EYELID WITH EXCESS SKIN;              Surgeon: Imagene Riches, MD;  Location: [facility]               CNTR;  Service: Ophthalmology;  Laterality: Bilateral; 05/09/2015: CATARACT EXTRACTION W/PHACO; Right     Comment:  Procedure: CATARACT EXTRACTION PHACO AND INTRAOCULAR               LENS PLACEMENT (IOC);  Surgeon: Sallee Lange, MD;                Location: [facility] ORS;  Service: Ophthalmology;  Laterality:              Right;  [place] 45.1 AP% 21.0 CDE 19.36 Fluid Pack Lot #               [id number] 03/28/2016: CATARACT EXTRACTION W/PHACO; Left     Comment:  Procedure: CATARACT EXTRACTION PHACO AND INTRAOCULAR               LENS PLACEMENT (IOC);  Surgeon: Sallee Lange, MD;                Location: [facility] ORS;  Service: Ophthalmology;  Laterality:              Left;  [place] 01:05 AP% 21.3 CDE 24.21 fluid pack lot #  [id number] No date: CESAREAN SECTION No date: COLONOSCOPY 05/22/2019: ESOPHAGOGASTRODUODENOSCOPY (EGD) WITH PROPOFOL; N/A     Comment:  Procedure: ESOPHAGOGASTRODUODENOSCOPY (EGD) WITH BIOPSY;              Surgeon: Pasty Spillers, MD;  Location: [facility]               SURGERY CNTR;  Service: Endoscopy;  Laterality: N/A; No date: HERNIA REPAIR 07/02/2018: KNEE ARTHROPLASTY; Right     Comment:  Procedure: COMPUTER ASSISTED TOTAL KNEE ARTHROPLASTY               RIGHT;  Surgeon: Donato Heinz, MD;  Location: [facility]               ORS;  Service: Orthopedics;  Laterality: Right; 11/12/2018: KNEE ARTHROPLASTY; Left     Comment:  Procedure: COMPUTER ASSISTED TOTAL KNEE ARTHROPLASTY;                Surgeon: Donato Heinz, MD;  Location: [facility] ORS;                Service: Orthopedics;  Laterality: Left; No date: KNEE ARTHROSCOPY; Bilateral No date: ROTATOR CUFF REPAIR; Bilateral  BMI    Body Mass Index: 25.23 kg/m      Reproductive/Obstetrics negative OB ROS                             Anesthesia  Physical Anesthesia Plan  ASA: 2  Anesthesia Plan: General ETT   Post-op Pain Management: Regional block*   Induction: Intravenous  PONV Risk Score and Plan: 3 and Ondansetron, Dexamethasone and Treatment may vary due to age or medical condition  Airway Management Planned: Oral ETT  Additional Equipment:   Intra-op Plan:   Post-operative Plan: Extubation in OR  Informed Consent: I have reviewed the patients History and Physical, chart, labs and discussed the procedure including the risks, benefits and alternatives for the proposed anesthesia with the patient or authorized representative who has indicated his/her understanding and acceptance.     Dental Advisory Given  Plan Discussed with: Anesthesiologist, CRNA and Surgeon  Anesthesia Plan Comments: (Patient consented for risks of anesthesia including but not limited to:  - adverse reactions to medications - damage to eyes, teeth, lips or other oral mucosa - nerve damage due to positioning  - sore throat or hoarseness - Damage to heart, brain, nerves, lungs, other parts of body or loss of life  Patient voiced understanding and assent.)        Anesthesia Quick Evaluation

## 2022-10-16 NOTE — Anesthesia Postprocedure Evaluation (Signed)
Anesthesia Post Note  Patient: Alyssa Mcclure  Procedure(s) Performed: RIGHT SHOULDER ARTHROSCOPY WITH DEBRIDEMENT, DECOMPRESSION, ROTATOR CUFF REPAIR, AND BICEPS TENODESIS. (Right: Shoulder)  Patient location during evaluation: PACU Anesthesia Type: General Level of consciousness: awake and alert Pain management: pain level controlled Vital Signs Assessment: post-procedure vital signs reviewed and stable Respiratory status: spontaneous breathing, nonlabored ventilation, respiratory function stable and patient connected to nasal cannula oxygen Cardiovascular status: blood pressure returned to baseline and stable Postop Assessment: no apparent nausea or vomiting Anesthetic complications: no   No notable events documented.   Last Vitals:  Vitals:   10/16/22 1600 10/16/22 1612  BP: 132/63 (!) 151/70  Pulse: 78 78  Resp: 14 18  Temp: (!) 36.1 C   SpO2: 99% 93%    Last Pain:  Vitals:   10/16/22 1612  TempSrc:   PainSc: 0-No pain                 Corinda Gubler

## 2022-10-16 NOTE — Op Note (Signed)
10/16/2022  3:28 PM  Patient:   Alyssa Mcclure  Pre-Op Diagnosis:   Impingement/tendinopathy with rotator cuff tear and biceps tendinopathy, right shoulder.  Post-Op Diagnosis:   Impingement/tendinopathy with full-thickness rotator cuff tear, degenerative labral tear, degenerative joint disease of glenohumeral joint, and biceps tendinopathy, right shoulder.  Procedure:   Extensive arthroscopic debridement, arthroscopic subscapularis tendon repair, arthroscopic subacromial decompression, mini-open repair of supraspinatus/infraspinatus tendon tears, and mini-open biceps tenodesis, right shoulder.  Anesthesia:   General endotracheal with interscalene block using Exparel placed preoperatively by the anesthesiologist.  Surgeon:   Maryagnes Amos, MD  Assistant:   Horris Latino  Findings:   As above. There was a near full-thickness tear involving the superior insertional fibers of the subscapularis tendon. There was a full-thickness tear involving the the entire supraspinatus tendon and extending into the anterior portion of the infraspinatus tendon. There were grade 2-3 chondromalacial changes involving the glenoid and humerus articular surfaces. There was extensive labral fraying/tearing involving the anterior and superior portions of the labrum without frank detachment from the glenoid rim.  Complications:   None  Fluids:   800 cc  Estimated blood loss:   10 cc  Tourniquet time:   None  Drains:   None  Closure:   Staples      Brief clinical note:   The patient is a 72 year old female with a history of progressively worsening pain and weakness of her right shoulder. The patient's symptoms have progressed despite medications, activity modification, etc. The patient's history and examination are consistent with impingement/tendinopathy with a rotator cuff tear. These findings were confirmed by MRI scan. The patient presents at this time for definitive management of these shoulder  symptoms.  Procedure:   The patient underwent placement of an interscalene block using Exparel by the anesthesiologist in the preoperative holding area before being brought into the operating room and lain in the supine position. The patient then underwent general endotracheal intubation and anesthesia before being repositioned in the beach chair position using the beach chair positioner. The right shoulder and upper extremity were prepped with ChloraPrep solution before being draped sterilely. Preoperative antibiotics were administered. A timeout was performed to confirm the proper surgical site before the expected portal sites and incision site were injected with 0.5% Sensorcaine with epinephrine.   A posterior portal was created and the glenohumeral joint thoroughly inspected with the findings as described above. An anterior portal was created using an outside-in technique. The labrum and rotator cuff were further probed, again confirming the above-noted findings. The areas of labral fraying were debrided back to stable margins using the full-radius resector, as were the torn portions of the subscapularis tendon, supraspinatus tendon, and infraspinatus tendons. Areas of chondromalacia on the glenoid and humeral surfaces also were debrided back to stable margins using the full-radius resector. Finally, areas of synovitis were debrided back to stable margins using the full-radius resector. The ArthroCare wand was inserted and used to release the biceps tendon from its labral anchor. It also was used to obtain hemostasis as well as to "anneal" the labrum superiorly and anteriorly.   The subscapularis tendon tear was repaired arthroscopically. A separate superolateral portal was created using an outside-in technique to serve as a working portal.  The exposed portion of the lesser tuberosity was roughened with a full-radius resector to stimulate healing before the subscapularis tendon was repaired using two  Mitek BioKnotless anchors placed through the anterior portal. The adequacy of repair was assessed both by probing as well  as with passive external rotation of the humerus and found to be excellent. The instruments were removed from the joint after suctioning the excess fluid.  The camera was repositioned through the posterior portal into the subacromial space. A separate lateral portal was created using an outside-in technique. The 3.5 mm full-radius resector was introduced and used to perform a subtotal bursectomy. The ArthroCare wand was then inserted and used to remove the periosteal tissue off the undersurface of the anterior third of the acromion as well as to recess the coracoacromial ligament from its attachment along the anterior and lateral margins of the acromion. The 4.0 mm acromionizing bur was introduced and used to complete the decompression by removing the undersurface of the anterior third of the acromion. The full radius resector was reintroduced to remove any residual bony debris before the ArthroCare wand was reintroduced to obtain hemostasis. The instruments were then removed from the subacromial space after suctioning the excess fluid.  An approximately 4-5 cm incision was made over the anterolateral aspect of the shoulder beginning at the anterolateral corner of the acromion and extending distally in line with the bicipital groove. This incision was carried down through the subcutaneous tissues to expose the deltoid fascia. The raphae between the anterior and middle thirds was identified and this plane developed to provide access into the subacromial space. Additional bursal tissues were debrided sharply using Metzenbaum scissors. The rotator cuff tear was readily identified. The margins were debrided sharply with a #15 blade and the exposed greater tuberosity roughened with a rongeur. The tear was repaired using two Smith [employer]hew 2.9 mm Q-Fix anchors. These sutures were then brought back  laterally and secured using two Smith [employer] Nephew Healicoil knotless RegeneSorb anchors to create a two-layer closure. Several #0 Ethibond interrupted sutures were placed in a side-to-side fashion to help reapproximate the longitudinal portion of the tear. An apparent watertight closure was obtained.  The bicipital groove was identified by palpation and opened for 1-1.5 cm. The biceps tendon stump was retrieved through this defect. The floor of the bicipital groove was roughened with a curet before a single Biomet 2.9 mm JuggerKnot anchor was inserted. Both sets of sutures were passed through the biceps tendon and tied securely to effect the tenodesis. The bicipital sheath was reapproximated using two #0 Ethibond interrupted sutures, incorporating the biceps tendon to further reinforce the tenodesis.  The wound was copiously irrigated with sterile saline solution before the deltoid raphae was reapproximated using 2-0 Vicryl interrupted sutures. The subcutaneous tissues were closed in two layers using 2-0 Vicryl interrupted sutures before the skin was closed using staples. The portal sites also were closed using staples. A sterile bulky dressing was applied to the shoulder before the arm was placed into a shoulder immobilizer. The patient was then awakened, extubated, and returned to the recovery room in satisfactory condition after tolerating the procedure well.

## 2022-10-16 NOTE — Anesthesia Procedure Notes (Signed)
Procedure Name: Intubation Date/Time: 10/16/2022 11:31 AM  Performed by: Carolynne Edouard, RNPre-anesthesia Checklist: Patient identified, Emergency Drugs available, Suction available and Patient being monitored Patient Re-evaluated:Patient Re-evaluated prior to induction Oxygen Delivery Method: Circle system utilized Preoxygenation: Pre-oxygenation with 100% oxygen Induction Type: IV induction Ventilation: Mask ventilation without difficulty and Oral airway inserted - appropriate to patient size Laryngoscope Size: McGraph and 3 Grade View: Grade I Tube type: Oral Tube size: 7.0 mm Number of attempts: 1 Airway Equipment and Method: Stylet and Oral airway Placement Confirmation: ETT inserted through vocal cords under direct vision, positive ETCO2 and breath sounds checked- equal and bilateral Secured at: 21 cm Tube secured with: Tape Dental Injury: Teeth and Oropharynx as per pre-operative assessment

## 2022-10-16 NOTE — Anesthesia Procedure Notes (Signed)
Anesthesia Regional Block: Interscalene brachial plexus block   Pre-Anesthetic Checklist: , timeout performed,  Correct Patient, Correct Site, Correct Laterality,  Correct Procedure, Correct Position, site marked,  Risks and benefits discussed,  Surgical consent,  Pre-op evaluation,  At surgeon's request and post-op pain management  Laterality: Right  Prep: alcohol swabs       Needles:  Injection technique: Single-shot  Needle Type: Echogenic Needle     Needle Length: 4cm  Needle Gauge: 25     Additional Needles:   Procedures:,,,, ultrasound used (permanent image in chart),,    Narrative:  Start time: 10/16/2022 12:55 PM End time: 10/16/2022 12:57 PM Injection made incrementally with aspirations every 5 mL.  Performed by: Personally  Anesthesiologist: Louie Boston, MD  Additional Notes: Patient's chart reviewed and they were deemed appropriate candidate for procedure, at surgeon's request. Patient educated about risks, benefits, and alternatives of the block including but not limited to: temporary or permanent nerve damage, bleeding, infection, damage to surround tissues, pneumothorax, hemidiaphragmatic paralysis, unilateral Horner's syndrome, block failure, local anesthetic toxicity. Patient expressed understanding. A formal time-out was conducted consistent with institution rules.  Monitors were applied, and minimal sedation used (see nursing record). The site was prepped with skin prep and allowed to dry, and sterile gloves were used. A high frequency linear ultrasound probe with probe cover was utilized throughout. C5-7 nerve roots located and appeared anatomically normal, local anesthetic injected around them, and echogenic block needle trajectory was monitored throughout. Aspiration performed every 5ml. Lung and blood vessels were avoided. All injections were performed without resistance and free of blood and paresthesias. The patient tolerated the procedure  well.  Injectate: 20ml exparel

## 2022-10-16 NOTE — H&P (Signed)
History of Present Illness:  Alyssa Mcclure is a 72 y.o. female who presents for follow-up of her right shoulder pain secondary to impingement/tendinopathy with a probable rotator cuff tear. The patient was last seen for these symptoms 4 weeks ago. At this visit, she received a steroid injection which she states provided little if any relief of her symptoms. She still notes moderate to severe pain in her shoulder which she rates at 8/10 on today's visit. She has been taking Tylenol as necessary with limited relief. Her symptoms are worse with activities at or above shoulder level, as well as when trying to reach behind her back. She denies any reinjury to the shoulder and denies any numbness or paresthesias down her arm to her hand. Since her last visit, she has undergone an MRI scan of presents today to review these results.  Current Outpatient Medications:   aspirin 81 mg tablet Take 1 tablet (81 mg total) by mouth once daily  BIOTIN ORAL Take 1,500 mcg by mouth once daily  CYANOCOBALAMIN, VITAMIN B-12, ORAL Take 1,000 mcg by mouth once daily  DULoxetine (CYMBALTA) 60 MG DR capsule Take 1 capsule (60 mg total) by mouth once daily 90 capsule 2  ezetimibe (ZETIA) 10 mg tablet Take 1 tablet by mouth once daily  gabapentin (NEURONTIN) 300 MG capsule Take by mouth  HYDROcodone-chlorpheniramine (TUSSIONEX) 10-8 mg/5 mL ER suspension Take 5 mLs by mouth every 12 (twelve) hours as needed for Cough 120 mL 0  losartan (COZAAR) 100 MG tablet TAKE 1 TABLET BY MOUTH EVERY DAY 90 tablet 1  sod sulf-pot chloride-mag sulf 1.479-0.188- 0.225 gram Tab Take 24 tablets by mouth as directed 24 tablet 0   Current Facility-Administered Medications:  cyanocobalamin (VITAMIN B12) injection 1,000 mcg 1,000 mcg Intramuscular Q30 Days Maurine Minister [place], [place] 1,000 mcg at 09/06/22 1127   Allergies:  Penicillins Hives  Omeprazole Other (Sores in her mouth)  Proton Pump Inhibitors Other (Mouth sores)  Adhesive Rash  Some  banaids  Losartan Cough and Other (Patient states still on Losartan miscategorized)  Statins-Hmg-Coa Reductase Inhibitors Muscle Pain   Past Medical History:  Allergy  B12 deficiency  diagnosed November 2013.  DDD (degenerative disc disease), cervical  Depression  chronic pain  Derangement of posterior horn of medial meniscus 06/10/2013  Essential hypertension, benign 06/10/2013  GERD (gastroesophageal reflux disease)  Heart murmur  Hyperlipidemia  Hypertension  Impingement syndrome of shoulder, right 02/16/2014  Obstructive sleep apnea  Osteoporosis  Pure hypercholesterolemia 10/20/2013  Surgical menopause   Past Surgical History:  HYSTERECTOMY 1989 for endometriosis and menorrhagia  HERNIA REPAIR 2000  ABDOMINOPLASTY 2001  RIGHT INGUINAL HERNIA REPAIR 2002  RIGHT KNEE SURGERY 2006 (Arthroscopy)  COLONOSCOPY 2006 (([facility], ): CBF 2016)  KNEE ARTHROSCOPY 07/29/2009 (Left knee arthroscopy, partial medial menisectomy and chondroplasty of the medial, lateral and patellofemoral compartments)  COLONOSCOPY 04/19/2014 (Int Hemorrhoids: CBF 04/2024)  EGD 04/19/2014 (No repeat per RTE)  EGD 03/14/2017 (Gastritis: No repeat per RTE)  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 (Dr Ernest Pine)  Left total knee arthroplasty using computer-assisted navigation 11/12/2018 (Dr Ernest Pine)  BILATERAL CARPAL TUNNEL SURGERY  BILATERAL ELBOW SURGERY  BILATERAL SHOULDER SURGERY X 2  CATARACT EXTRACTION Bilateral  CESAREAN SECTION (Prolapsed bladder fix April/May 2011)  REMOVED LUMP FROM RIGHT WRIST VEIN   Family History:  High blood pressure (Hypertension) Mother Luther Hearing  X  Heart disease Mother Luther Hearing  Diabetes Mother Luther Hearing  Arthritis Mother Luther Hearing  Coronary Artery Disease (Blocked  arteries around heart) Mother Luther Hearing  Heart disease on both side of family  Myocardial Infarction (Heart attack) Father  Arthritis Father   Epilepsy Other  Siblings  Heart disease Paternal Uncle  Heart disease Paternal Grandfather  Breast cancer Daughter [place]   Social History:   Socioeconomic History:  Marital status: Divorced  Number of children: 1  Years of education: 12+  Highest education level: High school graduate  Occupational History  Occupation: Retired  Tobacco Use  Smoking status: Former  Current packs/day: 0.00  Average packs/day: 1 pack/day for 35.0 years (35.0 ttl pk-yrs)  Types: Cigarettes  Start date: 01/09/1968  Quit date: 01/09/2003  Years since quitting: 19.7  Smokeless tobacco: Never  Tobacco comments:  15 yrs no smoking  Vaping Use  Vaping status: Never Used  Substance and Sexual Activity  Alcohol use: Yes  Alcohol/week: 1.0 standard drink of alcohol  Types: 1 Glasses of wine per week  Comment: 1 glass 3 times a week  Drug use: No  Sexual activity: Not Currently  Partners: Male  Birth control/protection: None  Social History Narrative  Lives alone. Has 47 yo son in [place], has daughter in [place] with breat ca (2017) and niece locally. 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   Tobacco quit 2000, 15 pk yrs. Occ etoh.   Social Determinants of Health:   Financial Resource Strain: Low Risk (05/28/2022)  Overall Financial Resource Strain (CARDIA)  Difficulty of Paying Living Expenses: Not hard at all  Food Insecurity: No Food Insecurity (05/28/2022)  Hunger Vital Sign  Worried About Running Out of Food in the Last Year: Never true  Ran Out of Food in the Last Year: Never true  Transportation Needs: No Transportation Needs (05/28/2022)  PRAPARE - Risk analyst (Medical): No  Lack of Transportation (Non-Medical): No   Review of Systems:  A comprehensive 14 point ROS was performed, reviewed, and the pertinent orthopaedic findings are documented in the HPI.  Physical Exam: Vitals:  09/24/22 0952  BP: 112/70  Weight: 71.5 kg (157 lb 9.6 oz)   Height: 160 cm (5\' 3" )  PainSc: 8  PainLoc: Shoulder   General/Constitutional: The patient appears to be well-nourished, well-developed, and in no acute distress. Neuro/Psych: Normal mood and affect, oriented to person, place and time. Eyes: Non-icteric. Pupils are equal, round, and reactive to light, and exhibit synchronous movement. ENT: Unremarkable. Lymphatic: No palpable adenopathy. Respiratory: Lungs clear to auscultation, Normal chest excursion, No wheezes, and Non-labored breathing Cardiovascular: Regular rate and rhythm. No murmurs. and No edema, swelling or tenderness, except as noted in detailed exam. Integumentary: No impressive skin lesions present, except as noted in detailed exam. Musculoskeletal: Unremarkable, except as noted in detailed exam.  Right shoulder exam: SKIN: Well-healed arthroscopic portal sites, otherwise unremarkable SWELLING: None WARMTH: None LYMPH NODES: No adenopathy palpable CREPITUS: None TENDERNESS: Mild tenderness over anterolateral shoulder ROM (active):  Forward flexion: 135 degrees Abduction: 135 degrees Internal rotation: L2 ROM (passive):  Forward flexion: 150 degrees Abduction: 145 degrees  ER/IR at 90 abd: 90 degrees/60 degrees  She describes mild-moderate pain with forward flexion, abduction, and internal rotation.  STRENGTH: Forward flexion: 4-4+/5 Abduction: 4/5 External rotation: 4-4+/5 Internal rotation: 4+-5/5 Pain with RC testing: Moderate pain with resisted abduction and mild pain with resisted forward flexion more so than with resisted internal rotation  STABILITY: Normal  SPECIAL TESTS: Juanetta Gosling' test: Mildly positive Speed's test: Positive Capsulitis - pain w/ passive ER: No Crossed arm  test: Mildly positive Crank: Not evaluated Anterior apprehension: Negative Posterior apprehension: Not evaluated  She is neurovascularly intact to the right upper extremity.  X-rays/MRI/Lab data:  A recent MRI scan of the  right shoulder is available for review and has been reviewed by myself. By report, the study demonstrates evidence of a large articular sided partial-thickness tear involving the entire supraspinatus tendon with a small full-thickness component anteriorly. Mild tendinopathic changes of the infraspinatus and subscapularis tendons also are noted. Moderate tendinopathic changes of the long head of the biceps tendon are present as well. No significant degenerative changes of the glenohumeral joint are identified. Both the films and report were reviewed by myself and discussed with the patient.  Assessment: 1. Tendinitis of upper biceps tendon of right shoulder.  2. Rotator cuff tendinitis, right.  3. Nontraumatic complete tear of right rotator cuff.  4. Impingement syndrome of shoulder, right.   Plan: The treatment options were discussed with the patient. In addition, patient educational materials were provided regarding the diagnosis and treatment options. The patient is quite frustrated by her symptoms and functional limitations, and is ready to consider more aggressive treatment options. Therefore, I have recommended a surgical procedure, specifically a right shoulder arthroscopy with debridement, decompression, rotator cuff repair, and biceps tenodesis. The procedure was discussed with the patient, as were the potential risks (including bleeding, infection, nerve and/or blood vessel injury, persistent or recurrent pain, failure of the repair, progression of arthritis, need for further surgery, blood clots, strokes, heart attacks and/or arhythmias, pneumonia, etc.) and benefits. The patient states her understanding and wishes to proceed. All of the patient's questions and concerns were answered. She can call any time with further concerns. She has been fitted with a shoulder immobilizer in preparation for her surgery. She is instructed to bring this with her on the day of surgery. She will follow up  post-surgery, routine.    H&P reviewed and patient re-examined. No changes.

## 2022-10-17 ENCOUNTER — Encounter: Payer: Self-pay | Admitting: Surgery

## 2022-10-22 DIAGNOSIS — Z9889 Other specified postprocedural states: Secondary | ICD-10-CM | POA: Diagnosis not present

## 2022-10-22 DIAGNOSIS — M25511 Pain in right shoulder: Secondary | ICD-10-CM | POA: Diagnosis not present

## 2022-11-06 DIAGNOSIS — M25511 Pain in right shoulder: Secondary | ICD-10-CM | POA: Diagnosis not present

## 2022-11-06 DIAGNOSIS — Z9889 Other specified postprocedural states: Secondary | ICD-10-CM | POA: Diagnosis not present

## 2022-11-12 DIAGNOSIS — E538 Deficiency of other specified B group vitamins: Secondary | ICD-10-CM | POA: Diagnosis not present

## 2022-11-14 DIAGNOSIS — Z9889 Other specified postprocedural states: Secondary | ICD-10-CM | POA: Diagnosis not present

## 2022-11-14 DIAGNOSIS — M25511 Pain in right shoulder: Secondary | ICD-10-CM | POA: Diagnosis not present

## 2022-11-21 DIAGNOSIS — E782 Mixed hyperlipidemia: Secondary | ICD-10-CM | POA: Diagnosis not present

## 2022-11-21 DIAGNOSIS — Z9889 Other specified postprocedural states: Secondary | ICD-10-CM | POA: Diagnosis not present

## 2022-11-21 DIAGNOSIS — M25511 Pain in right shoulder: Secondary | ICD-10-CM | POA: Diagnosis not present

## 2022-11-21 DIAGNOSIS — I10 Essential (primary) hypertension: Secondary | ICD-10-CM | POA: Diagnosis not present

## 2022-11-21 DIAGNOSIS — R7303 Prediabetes: Secondary | ICD-10-CM | POA: Diagnosis not present

## 2022-11-27 ENCOUNTER — Encounter: Payer: Self-pay | Admitting: Gastroenterology

## 2022-11-28 DIAGNOSIS — M25511 Pain in right shoulder: Secondary | ICD-10-CM | POA: Diagnosis not present

## 2022-11-28 DIAGNOSIS — K1379 Other lesions of oral mucosa: Secondary | ICD-10-CM | POA: Diagnosis not present

## 2022-11-28 DIAGNOSIS — Z87898 Personal history of other specified conditions: Secondary | ICD-10-CM | POA: Diagnosis not present

## 2022-11-28 DIAGNOSIS — E782 Mixed hyperlipidemia: Secondary | ICD-10-CM | POA: Diagnosis not present

## 2022-11-28 DIAGNOSIS — I10 Essential (primary) hypertension: Secondary | ICD-10-CM | POA: Diagnosis not present

## 2022-11-28 DIAGNOSIS — E538 Deficiency of other specified B group vitamins: Secondary | ICD-10-CM | POA: Diagnosis not present

## 2022-11-28 DIAGNOSIS — Z1211 Encounter for screening for malignant neoplasm of colon: Secondary | ICD-10-CM | POA: Diagnosis not present

## 2022-11-28 DIAGNOSIS — Z9889 Other specified postprocedural states: Secondary | ICD-10-CM | POA: Diagnosis not present

## 2022-11-28 DIAGNOSIS — K219 Gastro-esophageal reflux disease without esophagitis: Secondary | ICD-10-CM | POA: Diagnosis not present

## 2022-12-05 DIAGNOSIS — Z9889 Other specified postprocedural states: Secondary | ICD-10-CM | POA: Diagnosis not present

## 2022-12-05 DIAGNOSIS — M25511 Pain in right shoulder: Secondary | ICD-10-CM | POA: Diagnosis not present

## 2022-12-12 DIAGNOSIS — Z9889 Other specified postprocedural states: Secondary | ICD-10-CM | POA: Diagnosis not present

## 2022-12-12 DIAGNOSIS — M25511 Pain in right shoulder: Secondary | ICD-10-CM | POA: Diagnosis not present

## 2022-12-13 DIAGNOSIS — E538 Deficiency of other specified B group vitamins: Secondary | ICD-10-CM | POA: Diagnosis not present

## 2022-12-14 DIAGNOSIS — M25511 Pain in right shoulder: Secondary | ICD-10-CM | POA: Diagnosis not present

## 2022-12-14 DIAGNOSIS — Z9889 Other specified postprocedural states: Secondary | ICD-10-CM | POA: Diagnosis not present

## 2022-12-17 ENCOUNTER — Ambulatory Visit: Admit: 2022-12-17 | Payer: PPO | Admitting: Gastroenterology

## 2022-12-17 HISTORY — DX: Other specified disorders of nose and nasal sinuses: J34.89

## 2022-12-17 HISTORY — DX: Nasal valve collapse, unspecified: J34.829

## 2022-12-17 SURGERY — COLONOSCOPY WITH PROPOFOL
Anesthesia: General

## 2022-12-21 DIAGNOSIS — M542 Cervicalgia: Secondary | ICD-10-CM | POA: Diagnosis not present

## 2022-12-21 DIAGNOSIS — M436 Torticollis: Secondary | ICD-10-CM | POA: Diagnosis not present

## 2022-12-21 DIAGNOSIS — Z9889 Other specified postprocedural states: Secondary | ICD-10-CM | POA: Diagnosis not present

## 2022-12-21 DIAGNOSIS — M5030 Other cervical disc degeneration, unspecified cervical region: Secondary | ICD-10-CM | POA: Diagnosis not present

## 2022-12-21 DIAGNOSIS — M25511 Pain in right shoulder: Secondary | ICD-10-CM | POA: Diagnosis not present

## 2022-12-24 DIAGNOSIS — Z9889 Other specified postprocedural states: Secondary | ICD-10-CM | POA: Diagnosis not present

## 2022-12-24 DIAGNOSIS — M25511 Pain in right shoulder: Secondary | ICD-10-CM | POA: Diagnosis not present

## 2022-12-26 DIAGNOSIS — Z9889 Other specified postprocedural states: Secondary | ICD-10-CM | POA: Diagnosis not present

## 2022-12-26 DIAGNOSIS — M25511 Pain in right shoulder: Secondary | ICD-10-CM | POA: Diagnosis not present

## 2023-01-04 DIAGNOSIS — M25511 Pain in right shoulder: Secondary | ICD-10-CM | POA: Diagnosis not present

## 2023-01-04 DIAGNOSIS — Z1211 Encounter for screening for malignant neoplasm of colon: Secondary | ICD-10-CM | POA: Diagnosis not present

## 2023-01-04 DIAGNOSIS — Z9889 Other specified postprocedural states: Secondary | ICD-10-CM | POA: Diagnosis not present

## 2023-01-08 DIAGNOSIS — M25511 Pain in right shoulder: Secondary | ICD-10-CM | POA: Diagnosis not present

## 2023-01-08 DIAGNOSIS — Z9889 Other specified postprocedural states: Secondary | ICD-10-CM | POA: Diagnosis not present

## 2023-01-09 LAB — EXTERNAL GENERIC LAB PROCEDURE: COLOGUARD: NEGATIVE

## 2023-01-09 LAB — COLOGUARD
COLOGUARD: NEGATIVE
COLOGUARD: NEGATIVE

## 2023-01-14 DIAGNOSIS — M542 Cervicalgia: Secondary | ICD-10-CM | POA: Diagnosis not present

## 2023-01-15 ENCOUNTER — Other Ambulatory Visit: Payer: Self-pay | Admitting: Physical Medicine & Rehabilitation

## 2023-01-15 DIAGNOSIS — M542 Cervicalgia: Secondary | ICD-10-CM

## 2023-01-15 DIAGNOSIS — E538 Deficiency of other specified B group vitamins: Secondary | ICD-10-CM | POA: Diagnosis not present

## 2023-01-16 DIAGNOSIS — M25511 Pain in right shoulder: Secondary | ICD-10-CM | POA: Diagnosis not present

## 2023-01-16 DIAGNOSIS — Z9889 Other specified postprocedural states: Secondary | ICD-10-CM | POA: Diagnosis not present

## 2023-01-23 ENCOUNTER — Ambulatory Visit
Admission: RE | Admit: 2023-01-23 | Discharge: 2023-01-23 | Disposition: A | Discharge status: Home / Self Care | Payer: PPO | Source: Ambulatory Visit | Attending: Physical Medicine & Rehabilitation | Admitting: Physical Medicine & Rehabilitation

## 2023-01-23 DIAGNOSIS — M542 Cervicalgia: Secondary | ICD-10-CM

## 2023-01-23 DIAGNOSIS — M4802 Spinal stenosis, cervical region: Secondary | ICD-10-CM | POA: Diagnosis not present

## 2023-01-23 DIAGNOSIS — Z9889 Other specified postprocedural states: Secondary | ICD-10-CM | POA: Diagnosis not present

## 2023-01-23 DIAGNOSIS — M47812 Spondylosis without myelopathy or radiculopathy, cervical region: Secondary | ICD-10-CM | POA: Diagnosis not present

## 2023-01-23 DIAGNOSIS — M25511 Pain in right shoulder: Secondary | ICD-10-CM | POA: Diagnosis not present

## 2023-02-06 DIAGNOSIS — Z9889 Other specified postprocedural states: Secondary | ICD-10-CM | POA: Diagnosis not present

## 2023-02-06 DIAGNOSIS — M25511 Pain in right shoulder: Secondary | ICD-10-CM | POA: Diagnosis not present

## 2023-02-08 DIAGNOSIS — M9931 Osseous stenosis of neural canal of cervical region: Secondary | ICD-10-CM | POA: Diagnosis not present

## 2023-02-08 DIAGNOSIS — M542 Cervicalgia: Secondary | ICD-10-CM | POA: Diagnosis not present

## 2023-02-08 DIAGNOSIS — M5412 Radiculopathy, cervical region: Secondary | ICD-10-CM | POA: Diagnosis not present

## 2023-02-15 DIAGNOSIS — R7303 Prediabetes: Secondary | ICD-10-CM | POA: Diagnosis not present

## 2023-02-15 DIAGNOSIS — M5412 Radiculopathy, cervical region: Secondary | ICD-10-CM | POA: Diagnosis not present

## 2023-02-18 DIAGNOSIS — E538 Deficiency of other specified B group vitamins: Secondary | ICD-10-CM | POA: Diagnosis not present

## 2023-02-20 DIAGNOSIS — M25511 Pain in right shoulder: Secondary | ICD-10-CM | POA: Diagnosis not present

## 2023-02-20 DIAGNOSIS — Z9889 Other specified postprocedural states: Secondary | ICD-10-CM | POA: Diagnosis not present

## 2023-02-22 DIAGNOSIS — M19011 Primary osteoarthritis, right shoulder: Secondary | ICD-10-CM | POA: Diagnosis not present

## 2023-02-22 DIAGNOSIS — M7581 Other shoulder lesions, right shoulder: Secondary | ICD-10-CM | POA: Diagnosis not present

## 2023-02-22 DIAGNOSIS — M7521 Bicipital tendinitis, right shoulder: Secondary | ICD-10-CM | POA: Diagnosis not present

## 2023-02-22 DIAGNOSIS — Z9889 Other specified postprocedural states: Secondary | ICD-10-CM | POA: Diagnosis not present

## 2023-02-22 DIAGNOSIS — M24111 Other articular cartilage disorders, right shoulder: Secondary | ICD-10-CM | POA: Diagnosis not present

## 2023-02-22 DIAGNOSIS — M75121 Complete rotator cuff tear or rupture of right shoulder, not specified as traumatic: Secondary | ICD-10-CM | POA: Diagnosis not present

## 2023-03-01 DIAGNOSIS — M5412 Radiculopathy, cervical region: Secondary | ICD-10-CM | POA: Diagnosis not present

## 2023-03-01 DIAGNOSIS — M9931 Osseous stenosis of neural canal of cervical region: Secondary | ICD-10-CM | POA: Diagnosis not present

## 2023-03-01 DIAGNOSIS — M542 Cervicalgia: Secondary | ICD-10-CM | POA: Diagnosis not present

## 2023-03-04 DIAGNOSIS — M25511 Pain in right shoulder: Secondary | ICD-10-CM | POA: Diagnosis not present

## 2023-03-04 DIAGNOSIS — Z9889 Other specified postprocedural states: Secondary | ICD-10-CM | POA: Diagnosis not present

## 2023-03-21 DIAGNOSIS — E538 Deficiency of other specified B group vitamins: Secondary | ICD-10-CM | POA: Diagnosis not present

## 2023-03-27 ENCOUNTER — Other Ambulatory Visit: Payer: Self-pay | Admitting: Physician Assistant

## 2023-03-27 ENCOUNTER — Ambulatory Visit
Admission: RE | Admit: 2023-03-27 | Discharge: 2023-03-27 | Disposition: A | Discharge status: Home / Self Care | Source: Ambulatory Visit | Attending: Physician Assistant | Admitting: Physician Assistant

## 2023-03-27 DIAGNOSIS — R519 Headache, unspecified: Secondary | ICD-10-CM

## 2023-03-27 DIAGNOSIS — R410 Disorientation, unspecified: Secondary | ICD-10-CM | POA: Diagnosis not present

## 2023-03-27 DIAGNOSIS — R4189 Other symptoms and signs involving cognitive functions and awareness: Secondary | ICD-10-CM

## 2023-03-27 DIAGNOSIS — R531 Weakness: Secondary | ICD-10-CM | POA: Diagnosis not present

## 2023-03-27 DIAGNOSIS — Z79899 Other long term (current) drug therapy: Secondary | ICD-10-CM | POA: Diagnosis not present

## 2023-04-05 DIAGNOSIS — S46811D Strain of other muscles, fascia and tendons at shoulder and upper arm level, right arm, subsequent encounter: Secondary | ICD-10-CM | POA: Diagnosis not present

## 2023-04-05 DIAGNOSIS — Z9889 Other specified postprocedural states: Secondary | ICD-10-CM | POA: Diagnosis not present

## 2023-04-05 DIAGNOSIS — M19011 Primary osteoarthritis, right shoulder: Secondary | ICD-10-CM | POA: Diagnosis not present

## 2023-04-05 DIAGNOSIS — M542 Cervicalgia: Secondary | ICD-10-CM | POA: Diagnosis not present

## 2023-04-05 DIAGNOSIS — M7581 Other shoulder lesions, right shoulder: Secondary | ICD-10-CM | POA: Diagnosis not present

## 2023-04-05 DIAGNOSIS — M7521 Bicipital tendinitis, right shoulder: Secondary | ICD-10-CM | POA: Diagnosis not present

## 2023-04-27 ENCOUNTER — Emergency Department
Admission: EM | Admit: 2023-04-27 | Discharge: 2023-04-27 | Disposition: A | Discharge status: Home / Self Care | Attending: Emergency Medicine | Admitting: Emergency Medicine

## 2023-04-27 ENCOUNTER — Other Ambulatory Visit: Payer: Self-pay

## 2023-04-27 DIAGNOSIS — R197 Diarrhea, unspecified: Secondary | ICD-10-CM | POA: Diagnosis not present

## 2023-04-27 DIAGNOSIS — R531 Weakness: Secondary | ICD-10-CM | POA: Diagnosis not present

## 2023-04-27 DIAGNOSIS — I10 Essential (primary) hypertension: Secondary | ICD-10-CM | POA: Insufficient documentation

## 2023-04-27 LAB — COMPREHENSIVE METABOLIC PANEL WITH GFR
ALT: 43 U/L (ref 0–44)
AST: 48 U/L — ABNORMAL HIGH (ref 15–41)
Albumin: 3.9 g/dL (ref 3.5–5.0)
Alkaline Phosphatase: 100 U/L (ref 38–126)
Anion gap: 9 (ref 5–15)
BUN: 13 mg/dL (ref 8–23)
CO2: 21 mmol/L — ABNORMAL LOW (ref 22–32)
Calcium: 9.3 mg/dL (ref 8.9–10.3)
Chloride: 107 mmol/L (ref 98–111)
Creatinine, Ser: 0.87 mg/dL (ref 0.44–1.00)
GFR, Estimated: >60 mL/min (ref >60)
Glucose, Bld: 112 mg/dL — ABNORMAL HIGH (ref 70–99)
Potassium: 3.5 mmol/L (ref 3.5–5.1)
Sodium: 137 mmol/L (ref 135–145)
Total Bilirubin: 0.9 mg/dL (ref 0.0–1.2)
Total Protein: 6.9 g/dL (ref 6.5–8.1)

## 2023-04-27 LAB — CBC
HCT: 40.5 % (ref 36.0–46.0)
Hemoglobin: 13.6 g/dL (ref 12.0–15.0)
MCH: 29.9 pg (ref 26.0–34.0)
MCHC: 33.6 g/dL (ref 30.0–36.0)
MCV: 89 fL (ref 80.0–100.0)
Platelets: 213 10*3/uL (ref 150–400)
RBC: 4.55 MIL/uL (ref 3.87–5.11)
RDW: 13.2 % (ref 11.5–15.5)
WBC: 5.3 10*3/uL (ref 4.0–10.5)
nRBC: 0 % (ref 0.0–0.2)

## 2023-04-27 LAB — LIPASE, BLOOD: Lipase: 31 U/L (ref 11–51)

## 2023-04-27 NOTE — ED Triage Notes (Signed)
 Pt c/o diarrhea and weakness since Monday. Last BM this morning.

## 2023-04-27 NOTE — ED Provider Notes (Signed)
 [facility] Provider Note    Event Date/Time   First MD Initiated Contact with Patient 04/27/23 1004     (approximate)   History   Chief Complaint Diarrhea   HPI  Alyssa Mcclure is a 72 y.o. female with past medical history of hypertension, hyperlipidemia, GERD, and peripheral vascular disease who presents to the ED complaining of diarrhea.  Patient reports that she has had 5 days of persistent watery stools, has had occasional crampy pain in her abdomen, but denies any nausea or vomiting.  She does not have any pain in her abdomen currently and denies any blood in her stool.  She does state that her stools have become more formed over the past couple of days, but she did have some white stool today.  She reports feeling generally weak with the symptoms, denies any associated fevers, dysuria, flank pain, cough, chest pain, or shortness of breath.     Physical Exam   Triage Vital Signs: ED Triage Vitals  Encounter Vitals Group     BP 04/27/23 0925 125/69     Systolic BP Percentile --      Diastolic BP Percentile --      Pulse Rate 04/27/23 0925 85     Resp 04/27/23 0925 17     Temp 04/27/23 0925 97.6 F (36.4 C)     Temp Source 04/27/23 0925 Oral     SpO2 04/27/23 0925 100 %     Weight 04/27/23 0923 147 lb 0.8 oz (66.7 kg)     Height 04/27/23 0923 5\' 4"  (1.626 m)     Head Circumference --      Peak Flow --      Pain Score 04/27/23 0922 0     Pain Loc --      Pain Education --      Exclude from Growth Chart --     Most recent vital signs: Vitals:   04/27/23 0925  BP: 125/69  Pulse: 85  Resp: 17  Temp: 97.6 F (36.4 C)  SpO2: 100%    Constitutional: Alert and oriented. Eyes: Conjunctivae are normal. Head: Atraumatic. Nose: No congestion/rhinnorhea. Mouth/Throat: Mucous membranes are moist.  Cardiovascular: Normal rate, regular rhythm. Grossly normal heart sounds.  2+ radial pulses bilaterally. Respiratory: Normal respiratory effort.   No retractions. Lungs CTAB. Gastrointestinal: Soft and nontender. No distention. Musculoskeletal: No lower extremity tenderness nor edema.  Neurologic:  Normal speech and language. No gross focal neurologic deficits are appreciated.    ED Results / Procedures / Treatments   Labs (all labs ordered are listed, but only abnormal results are displayed) Labs Reviewed  COMPREHENSIVE METABOLIC PANEL WITH GFR - Abnormal; Notable for the following components:      Result Value   CO2 21 (*)    Glucose, Bld 112 (*)    AST 48 (*)    All other components within normal limits  LIPASE, BLOOD  CBC  URINALYSIS, ROUTINE W REFLEX MICROSCOPIC    PROCEDURES:  Critical Care performed: No  Procedures   MEDICATIONS ORDERED IN ED: Medications - No data to display   IMPRESSION / MDM / ASSESSMENT AND PLAN / ED COURSE  I reviewed the triage vital signs and the nursing notes.                              73 y.o. female with past medical history of hypertension, hyperlipidemia, GERD, and peripheral vascular  disease who presents to the ED complaining of 5 days of diarrhea associated with generalized weakness.  Patient's presentation is most consistent with acute presentation with potential threat to life or bodily function.  Differential diagnosis includes, but is not limited to, sepsis, gastroenteritis, dehydration, electrolyte abnormality, AKI.  Patient nontoxic-appearing and in no acute distress, vital signs are unremarkable.  She has a benign abdominal exam and labs are reassuring with no significant anemia, leukocytosis, electrolyte abnormality, or AKI.  LFTs and lipase are also unremarkable, patient denies any symptoms of urinary tract infection.  Suspect viral gastroenteritis and patient appropriate for discharge home with outpatient follow-up.  She was counseled to use loperamide as needed and counseled to return to the ED for new or worsening symptoms.  Patient agrees with plan.       FINAL CLINICAL IMPRESSION(S) / ED DIAGNOSES   Final diagnoses:  Diarrhea of presumed infectious origin  Generalized weakness     Rx / DC Orders   ED Discharge Orders     None        Note:  This document was prepared using Dragon voice recognition software and may include unintentional dictation errors.   Twilla Galea, MD 04/27/23 1018

## 2023-04-27 NOTE — ED Triage Notes (Signed)
 Brought from [facility]. Diarrhea and nausea started Monday. Patient reports stool is white in color.   Denies emesis   KC vitals:  130/78 b/p 78pulse 98% RA 97.5oral

## 2023-05-08 DIAGNOSIS — E538 Deficiency of other specified B group vitamins: Secondary | ICD-10-CM | POA: Diagnosis not present

## 2023-05-28 ENCOUNTER — Encounter (INDEPENDENT_AMBULATORY_CARE_PROVIDER_SITE_OTHER): Payer: Self-pay

## 2023-05-28 DIAGNOSIS — Z961 Presence of intraocular lens: Secondary | ICD-10-CM | POA: Diagnosis not present

## 2023-05-28 DIAGNOSIS — E119 Type 2 diabetes mellitus without complications: Secondary | ICD-10-CM | POA: Diagnosis not present

## 2023-06-19 DIAGNOSIS — E538 Deficiency of other specified B group vitamins: Secondary | ICD-10-CM | POA: Diagnosis not present

## 2023-06-20 ENCOUNTER — Encounter (INDEPENDENT_AMBULATORY_CARE_PROVIDER_SITE_OTHER): Payer: PPO

## 2023-06-20 ENCOUNTER — Ambulatory Visit (INDEPENDENT_AMBULATORY_CARE_PROVIDER_SITE_OTHER): Payer: PPO | Admitting: Nurse Practitioner

## 2023-07-02 DIAGNOSIS — R0781 Pleurodynia: Secondary | ICD-10-CM | POA: Diagnosis not present

## 2023-07-02 DIAGNOSIS — S299XXA Unspecified injury of thorax, initial encounter: Secondary | ICD-10-CM | POA: Diagnosis not present

## 2023-07-08 DIAGNOSIS — R531 Weakness: Secondary | ICD-10-CM | POA: Diagnosis not present

## 2023-07-08 DIAGNOSIS — R296 Repeated falls: Secondary | ICD-10-CM | POA: Diagnosis not present

## 2023-07-08 DIAGNOSIS — R410 Disorientation, unspecified: Secondary | ICD-10-CM | POA: Diagnosis not present

## 2023-07-08 DIAGNOSIS — R0781 Pleurodynia: Secondary | ICD-10-CM | POA: Diagnosis not present

## 2023-07-08 DIAGNOSIS — R4189 Other symptoms and signs involving cognitive functions and awareness: Secondary | ICD-10-CM | POA: Diagnosis not present

## 2023-07-09 DIAGNOSIS — Z87898 Personal history of other specified conditions: Secondary | ICD-10-CM | POA: Diagnosis not present

## 2023-07-09 DIAGNOSIS — E782 Mixed hyperlipidemia: Secondary | ICD-10-CM | POA: Diagnosis not present

## 2023-07-09 DIAGNOSIS — I10 Essential (primary) hypertension: Secondary | ICD-10-CM | POA: Diagnosis not present

## 2023-07-15 ENCOUNTER — Other Ambulatory Visit: Payer: Self-pay | Admitting: Physician Assistant

## 2023-07-15 DIAGNOSIS — Z1231 Encounter for screening mammogram for malignant neoplasm of breast: Secondary | ICD-10-CM | POA: Diagnosis not present

## 2023-07-15 DIAGNOSIS — Z1331 Encounter for screening for depression: Secondary | ICD-10-CM | POA: Diagnosis not present

## 2023-07-15 DIAGNOSIS — R4189 Other symptoms and signs involving cognitive functions and awareness: Secondary | ICD-10-CM | POA: Diagnosis not present

## 2023-07-15 DIAGNOSIS — I10 Essential (primary) hypertension: Secondary | ICD-10-CM | POA: Diagnosis not present

## 2023-07-15 DIAGNOSIS — K219 Gastro-esophageal reflux disease without esophagitis: Secondary | ICD-10-CM | POA: Diagnosis not present

## 2023-07-15 DIAGNOSIS — E538 Deficiency of other specified B group vitamins: Secondary | ICD-10-CM | POA: Diagnosis not present

## 2023-07-15 DIAGNOSIS — R296 Repeated falls: Secondary | ICD-10-CM | POA: Diagnosis not present

## 2023-07-15 DIAGNOSIS — R7303 Prediabetes: Secondary | ICD-10-CM | POA: Diagnosis not present

## 2023-07-15 DIAGNOSIS — E782 Mixed hyperlipidemia: Secondary | ICD-10-CM | POA: Diagnosis not present

## 2023-07-15 DIAGNOSIS — Z0000 Encounter for general adult medical examination without abnormal findings: Secondary | ICD-10-CM | POA: Diagnosis not present

## 2023-07-22 DIAGNOSIS — E538 Deficiency of other specified B group vitamins: Secondary | ICD-10-CM | POA: Diagnosis not present

## 2023-08-05 ENCOUNTER — Ambulatory Visit: Attending: Physical Medicine & Rehabilitation | Admitting: Physical Therapy

## 2023-08-05 DIAGNOSIS — R2689 Other abnormalities of gait and mobility: Secondary | ICD-10-CM | POA: Insufficient documentation

## 2023-08-05 DIAGNOSIS — R2681 Unsteadiness on feet: Secondary | ICD-10-CM | POA: Diagnosis not present

## 2023-08-05 DIAGNOSIS — R300 Dysuria: Secondary | ICD-10-CM | POA: Diagnosis not present

## 2023-08-05 DIAGNOSIS — R269 Unspecified abnormalities of gait and mobility: Secondary | ICD-10-CM | POA: Diagnosis not present

## 2023-08-05 DIAGNOSIS — R29898 Other symptoms and signs involving the musculoskeletal system: Secondary | ICD-10-CM | POA: Insufficient documentation

## 2023-08-05 DIAGNOSIS — R350 Frequency of micturition: Secondary | ICD-10-CM | POA: Diagnosis not present

## 2023-08-05 NOTE — Therapy (Unsigned)
 OUTPATIENT PHYSICAL THERAPY NEURO EVALUATION   Patient Name: Alyssa Mcclure MRN: [id number] DOB:06/06/1950, 73 y.o., female Today's Date: 08/05/2023   PCP: Marikay Eva POUR, PA  REFERRING PROVIDER: Marikay Eva POUR, PA  END OF SESSION:  PT End of Session - 08/05/23 1448     Visit Number 1    Number of Visits 16    Date for PT Re-Evaluation 09/30/23    Equipment Utilized During Treatment Gait belt    Activity Tolerance Patient tolerated treatment well    Behavior During Therapy [facility] for tasks assessed/performed          Past Medical History:  Diagnosis Date   Arthritis    neck and back and joints   B12 deficiency    Depression    GERD (gastroesophageal reflux disease)    H/O Bell's palsy    Heart murmur    causes no issues   History of kidney stones    HLD (hyperlipidemia)    HOH (hard of hearing)    bilateral   Hypertension    controlled on meds   Impingement syndrome of shoulder, right    Multiple thyroid  nodules    Nasal valve collapse    Nasal vestibulitis    Peripheral vascular disease (HCC)    Pneumonia    Pre-diabetes    Rotator cuff tendinitis, right    Sleep apnea    mild case per Dr. patient brynda that she does not have this   Tendinitis of upper biceps tendon of right shoulder    Past Surgical History:  Procedure Laterality Date   ABDOMINAL HYSTERECTOMY     ABDOMINOPLASTY     BILATERAL CARPAL TUNNEL RELEASE Bilateral    bladder lift  2011   BROW LIFT Bilateral 06/12/2016   Procedure: BLEPHAROPLASTY UPPER EYELID WITH EXCESS SKIN;  Surgeon: Ashley Greig HERO, MD;  Location: [facility];  Service: Ophthalmology;  Laterality: Bilateral;   CATARACT EXTRACTION W/PHACO Right 05/09/2015   Procedure: CATARACT EXTRACTION PHACO AND INTRAOCULAR LENS PLACEMENT (IOC);  Surgeon: Steven Dingeldein, MD;  Location: [facility];  Service: Ophthalmology;  Laterality: Right;  US  45.1 AP% 21.0 CDE 19.36 Fluid Pack Lot # [id number]   CATARACT EXTRACTION  W/PHACO Left 03/28/2016   Procedure: CATARACT EXTRACTION PHACO AND INTRAOCULAR LENS PLACEMENT (IOC);  Surgeon: Steven Dingeldein, MD;  Location: [facility];  Service: Ophthalmology;  Laterality: Left;  US  01:05 AP% 21.3 CDE 24.21 fluid pack lot # [id number]   CESAREAN SECTION     COLONOSCOPY     ESOPHAGOGASTRODUODENOSCOPY (EGD) WITH PROPOFOL  N/A 05/22/2019   Procedure: ESOPHAGOGASTRODUODENOSCOPY (EGD) WITH BIOPSY;  Surgeon: Janalyn Keene NOVAK, MD;  Location: [facility];  Service: Endoscopy;  Laterality: N/A;   EYE SURGERY     HERNIA REPAIR     KNEE ARTHROPLASTY Right 07/02/2018   Procedure: COMPUTER ASSISTED TOTAL KNEE ARTHROPLASTY RIGHT;  Surgeon: Mardee Lynwood SQUIBB, MD;  Location: [facility];  Service: Orthopedics;  Laterality: Right;   KNEE ARTHROPLASTY Left 11/12/2018   Procedure: COMPUTER ASSISTED TOTAL KNEE ARTHROPLASTY;  Surgeon: Mardee Lynwood SQUIBB, MD;  Location: [facility];  Service: Orthopedics;  Laterality: Left;   KNEE ARTHROSCOPY Bilateral    ROTATOR CUFF REPAIR Bilateral    SHOULDER ARTHROSCOPY WITH SUBACROMIAL DECOMPRESSION, ROTATOR CUFF REPAIR AND BICEP TENDON REPAIR Right 10/16/2022   Procedure: RIGHT SHOULDER ARTHROSCOPY WITH DEBRIDEMENT, DECOMPRESSION, ROTATOR CUFF REPAIR, AND BICEPS TENODESIS.;  Surgeon: Edie Norleen PARAS, MD;  Location: [facility];  Service: Orthopedics;  Laterality: Right;   Patient Active  Problem List   Diagnosis Date Noted   Total knee replacement status 11/12/2018   Recurrent oral ulcers 11/04/2018   ANA positive 11/04/2018   Status post total right knee replacement 07/02/2018   Chest heaviness 06/20/2018   Positive cardiac stress test 06/20/2018   Primary osteoarthritis of left knee 02/09/2018   Obstructive sleep apnea 03/02/2016   Nasal valve collapse 03/02/2016   Nasal vestibulitis 03/02/2016   Hyperlipidemia, unspecified 05/04/2014   Impingement syndrome of shoulder, right 02/16/2014   Gastroesophageal reflux disease 12/17/2013   Pure  hypercholesterolemia 10/20/2013   Elevated fasting blood sugar 10/20/2013   Depression 10/20/2013   Benign essential hypertension 06/10/2013   Derangement of posterior horn of medial meniscus 06/10/2013    ONSET DATE: >5 months   REFERRING DIAG: neck pain. Repeated falls   THERAPY DIAG:  Abnormality of gait and mobility  Unsteadiness on feet  Balance disorder  Leg weakness, bilateral  Rationale for Evaluation and Treatment: Rehabilitation  SUBJECTIVE:                                                                                                                                                                                             SUBJECTIVE STATEMENT: Pt reports that she has been having Neck pain for the last 5-6 months. Was Sent to PT at [facility] clinic for balance and neck pain management. Was told that her balance needed to be addressed with Neuro rehab before [facility] would be able see her. She states is having increased falls over the last year (>7 falls), resulting in rib and wrist pain. States that neck pain is still present, but slightly more manageable.  Pt accompanied by: self  PERTINENT HISTORY:   From recent MD appointment:  She experiences ongoing neck pain, which persists despite previous referrals to neurology for evaluation of neuropathy and foraminal stenosis. An appointment with neurology is scheduled for September, and she is on a waiting list for an earlier appointment. Her hemoglobin A1c has increased to 5.7, indicating prediabetes. She takes her dog for walks, aiming for four walks a day when the weather permits, as part of her lifestyle modifications. She has a history of statin intolerance, experiencing cramps with previous statin use. She has a significant family history of heart disease, with many family members having died from it. A sleep study in 2018 showed mild sleep apnea with a respiratory disturbance index of 16. She does not use a CPAP  machine and reports waking up with a dry mouth and snoring. She experienced a fall resulting in a bruise but does not recall if it was previously discussed. Her  feet are always cold. No burning or frequency of urination. She denies taking any opioid pain medications in the past year, despite being prescribed tramadol  after a fall.   PAIN:  Are you having pain? Yes: NPRS scale: 5/10  Pain location: neck and low back  pain  Pain description: UTI pain in the back. Stiff in the neck  Aggravating factors: worse in the morning  Relieving factors: none.   PRECAUTIONS: Fall  RED FLAGS: None and Bowel or bladder incontinence: Yes: new to possible UTI over the weekend    WEIGHT BEARING RESTRICTIONS: No  FALLS: Has patient fallen in last 6 months? Yes. Number of falls 7  LIVING ENVIRONMENT: Lives with: lives with their spouse Lives in: House/apartment Stairs: Yes: Internal: flight steps; on right going up and External: 7-8 steps; can reach both Has following equipment at home: None  PLOF: Independent, Independent with basic ADLs, and Independent with gait  PATIENT GOALS: stop falling, reduced neck pain . Remain independent   OBJECTIVE:  Note: Objective measures were completed at Evaluation unless otherwise noted.  DIAGNOSTIC FINDINGS:  CT 03/27/23 IMPRESSION: No CT evidence of acute intracranial abnormality.    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   COORDINATION: Ankle to knee WFL, but limited ROM in the R knee.  Finger to nose with slow deliberate movements.     MUSCLE TONE: WFL    POSTURE: rounded shoulders and forward head  LOWER EXTREMITY ROM:     Grossly WFL, with hx Bil knee replacements.   LOWER EXTREMITY MMT:    MMT Right Eval Left Eval  Hip flexion 4- 4-  Hip extension 4 4  Hip abduction 4 4  Hip adduction 4 4  Hip internal  rotation 4- 4-  Hip external rotation 4- 4-  Knee flexion 4 4  Knee extension 4+ 4+  Ankle dorsiflexion 4+ 4+  Ankle plantarflexion    Ankle inversion    Ankle eversion    (Blank rows = not tested)  BED MOBILITY:  Findings: Sit to supine Complete Independence Supine to sit Complete Independence Rolling to Right Complete Independence Rolling to Left Complete Independence  TRANSFERS: Sit to stand: Complete Independence  Assistive device utilized: None     Stand to sit: Modified independence  Assistive device utilized: None     Chair to chair: Complete Independence  Assistive device utilized: Crutches and None        STAIRS: Findings: Level of Assistance: Modified independence, Stair Negotiation Technique: Step to Pattern with Single Rail on Right, Number of Stairs: 4, Height of Stairs: 6   , and Comments:   GAIT: Findings: Gait Characteristics: decreased stride length and decreased trunk rotation, Distance walked: 60, Level of assistance: Complete Independence, and Comments: decreased pelvic rotation   FUNCTIONAL TESTS:  5 times sit to stand: 12.41 Timed up and go (TUG): 13.44 6 minute walk test: TBD 10 meter walk test: 0.48m/s Berg Balance Scale:  Item Test date: 08/05/2023  Test date:  Test date:   Sitting to standing 4. able to stand without using hands and stabilize independently Insert OPRCBERGREEVAL SmartPhrase at re-test date Insert OPRCBERGREEVAL SmartPhrase at re-test date  2. Standing unsupported 4. able to stand safely for 2 minutes    3. Sitting with back unsupported, feet supported 4. able to sit safely and securely for 2 minutes    4. Standing to sitting 4.  sits safely with minimal use of hands    5. Pivot transfer  4. able to transfer safely with minor use of hands    6. Standing unsupported with eyes closed 4. able to stand 10 seconds safely    7. Standing unsupported with feet together 3. able to place feet together independently and stand 1 minute with  supervision    8. Reaching forward with outstretched arms while standing 3. can reach forward 12 cm (5 inches)    9. Pick up object from the floor from standing 3. able to pick up slipper but needs supervision    10. Turning to look behind over left and right shoulders while standing 3. looks behind one side only, other side shows less weight shift    11. Turn 360 degrees 3. able to turn 360 degrees safely, one side only, in 4 seconds or less    12. Place alternate foot on step or stool while standing unsupported 4. ble to stand independently and safely and complete 8 steps in 20 seconds    13. Standing unsupported one foot in front 2. able to take small step independently and hold 30 seconds    14. Standing on one leg 3. able to lift leg independently and hold 5-10 seconds     Total Score 41/56 Total Score /56 Total Score /56  Patient demonstrates increased fall risk as noted by score of   41/56 on Berg Balance Scale.  (<36= high risk for falls, close to 100%; 37-45 significant >80%; 46-51 moderate >50%; 52-55 lower >25%)     Dynamic Gait Index  Mark the lowest level that applies.  TO BE COMPLETED:  Score Interpretation: Score of <19 indicates high risk of falls.  Minimally Clinically Important Difference (MCID):  =DGI scores of<21/24 = 1.80 points DGI scores of >21/24 = 0.60 points   Bellaire T, Inbar-Borovsky N, Brozgol M, Giladi N, [place] JM. The Dynamic Gait Index in healthy older adults: the role of stair climbing, fear of falling and gender. Gait Posture. 2009 Feb;29(2):237-41. doi: 10.1016/j.gaitpost.2008.08.013. Epub 2008 Oct 8. PMID: 81154560; PMCID: EFR7290501.  Pardasaney, MYRTIS LOIS Bonus, GEANNIE POUR., et al. (2012). Sensitivity to change and responsiveness of four balance measures for community-dwelling older adults. Physical therapy 92(3): 388-397.   PATIENT SURVEYS:  ABC scale: The Activities-Specific Balance Confidence (ABC) Scale 0% 10 20 30  40 50 60 70 80 90 100% No  confidence<->completely confident  "How confident are you that you will not lose your balance or become unsteady when you . . .   Date tested 08/05/2023   Total: #/16 75.93%                                                                                                                                 TREATMENT DATE:  08/05/2023  EVALUATION    PATIENT EDUCATION: Education details: POC. Benefits for PT to address balance deficits.  Person educated: Patient Education  method: Explanation Education comprehension: verbalized understanding  HOME EXERCISE PROGRAM: To be provided at follow-up appointent   GOALS: Goals reviewed with patient? Yes  SHORT TERM GOALS: Target date: 09/09/2023  Patient will be independent in home exercise program to improve strength/mobility for better functional independence with ADLs. Baseline: to be given  Goal status: INITIAL   LONG TERM GOALS: Target date: 09/30/2023    Patient will increase ABC  score by equal to or greater than  10 points    to demonstrate statistically significant improvement in mobility and quality of life.  Baseline: 75.93% Goal status: INITIAL  2.  Patient (> 15 years old) will complete five times sit to stand test in <11 seconds indicating an increased LE strength and improved balance. Baseline: 12.41 Goal status: INITIAL  3.  Patient will increase Berg Balance score by > 6 points to demonstrate decreased fall risk during functional activities Baseline: 41 Goal status: INITIAL  4.  Patient will increase 10 meter walk test to >1.15m/s as to improve gait speed for better community ambulation and to reduce fall risk. Baseline: 0.3m/s Goal status: INITIAL  5.  Patient will reduce timed up and go to <11 seconds to reduce fall risk and demonstrate improved transfer/gait ability. Baseline: 13.55 Goal status: INITIAL  6.  Patient will increaseFGA score to >20/30 as to demonstrate reduced fall risk and improved dynamic gait  balance for better safety with community/home ambulation.   Baseline: to be completed  Goal status: INITIAL   ASSESSMENT:  CLINICAL IMPRESSION: Patient is a 73 y.o. female who was seen today for physical therapy evaluation and treatment for Neck pain and multiple frequent falls. Reports recent annual physical, which frequent falls and neck pain were discussed. Pt was prescribed PT, and Was seen by Maryl PT on 7/23 and instructed to seek treatment from Neuro PT to reduce fall risk, prior to completing Ortho PT strictly for neck pain. Pt demonstrates increased fall risk with Berg balance scale of 41/56, <1.0 m/s , TUG >13 sec, and self reported ABC of 75%. Due to frequent falls, hx of neuropathy, decreased strength, and noted increase in fall risk through standardized outcome measures, pt will benefit from skilled PT to address deficits, improve safety with mobility improve overall QoL.   OBJECTIVE IMPAIRMENTS: Abnormal gait, decreased activity tolerance, decreased balance, decreased knowledge of use of DME, decreased mobility, difficulty walking, decreased ROM, decreased strength, impaired sensation, improper body mechanics, and pain.   ACTIVITY LIMITATIONS: carrying, lifting, bending, standing, squatting, reach over head, hygiene/grooming, locomotion level, and caring for others  PARTICIPATION LIMITATIONS: meal prep, cleaning, laundry, shopping, community activity, occupation, and yard work  PERSONAL FACTORS: 1-2 comorbidities: HTN, PVD,  are also affecting patient's functional outcome.   REHAB POTENTIAL: Good  CLINICAL DECISION MAKING: Evolving/moderate complexity  EVALUATION COMPLEXITY: Moderate  PLAN:  PT FREQUENCY: 1-2x/week  PT DURATION: 8 weeks  PLANNED INTERVENTIONS: 97164- PT Re-evaluation, 97750- Physical Performance Testing, 97110-Therapeutic exercises, 97530- Therapeutic activity, [id number]- Neuromuscular re-education, 97535- Self Care, [id number]- Manual therapy, [id number]-  Gait training, [id number]- Canalith repositioning, [id number]- Splinting, [id number]- Electrical stimulation (unattended), [id number]- Electrical stimulation (manual), [id number]- Ultrasound, 02987- Traction (mechanical), Patient/Family education, Balance training, Stair training, Taping, Joint mobilization, Joint manipulation, Spinal manipulation, Spinal mobilization, Scar mobilization, Vestibular training, Visual/preceptual remediation/compensation, DME instructions, Cryotherapy, and Moist heat  PLAN FOR NEXT SESSION:   Complete FGA Vestibular assessment.  Provide balance HEP.    Massie FORBES Dollar, PT 08/05/2023, 2:54 PM

## 2023-08-07 ENCOUNTER — Telehealth: Payer: Self-pay | Admitting: Licensed Clinical Social Worker

## 2023-08-07 ENCOUNTER — Encounter: Admitting: Physical Therapy

## 2023-08-07 DIAGNOSIS — F4323 Adjustment disorder with mixed anxiety and depressed mood: Secondary | ICD-10-CM | POA: Diagnosis not present

## 2023-08-07 NOTE — Telephone Encounter (Signed)
 Patient lvm for LCSW to cancel appointment reporting she has another appointment already scheduled. LCSW returned call and notified patient that she does not have an appointment scheduled with LCSW.

## 2023-08-08 ENCOUNTER — Encounter: Admitting: Physical Therapy

## 2023-08-12 ENCOUNTER — Encounter: Admitting: Physical Therapy

## 2023-08-14 ENCOUNTER — Encounter: Admitting: Physical Therapy

## 2023-08-14 DIAGNOSIS — F4323 Adjustment disorder with mixed anxiety and depressed mood: Secondary | ICD-10-CM | POA: Diagnosis not present

## 2023-08-19 ENCOUNTER — Encounter: Admitting: Physical Therapy

## 2023-08-21 ENCOUNTER — Encounter: Admitting: Physical Therapy

## 2023-08-21 ENCOUNTER — Ambulatory Visit
Admission: RE | Admit: 2023-08-21 | Discharge: 2023-08-21 | Disposition: A | Discharge status: Home / Self Care | Source: Ambulatory Visit | Attending: Physician Assistant | Admitting: Physician Assistant

## 2023-08-21 DIAGNOSIS — Z1231 Encounter for screening mammogram for malignant neoplasm of breast: Secondary | ICD-10-CM | POA: Diagnosis not present

## 2023-08-22 DIAGNOSIS — E538 Deficiency of other specified B group vitamins: Secondary | ICD-10-CM | POA: Diagnosis not present

## 2023-08-23 DIAGNOSIS — F4323 Adjustment disorder with mixed anxiety and depressed mood: Secondary | ICD-10-CM | POA: Diagnosis not present

## 2023-08-26 ENCOUNTER — Encounter: Admitting: Physical Therapy

## 2023-08-28 ENCOUNTER — Encounter: Admitting: Physical Therapy

## 2023-08-28 DIAGNOSIS — F4323 Adjustment disorder with mixed anxiety and depressed mood: Secondary | ICD-10-CM | POA: Diagnosis not present

## 2023-09-02 ENCOUNTER — Encounter: Admitting: Physical Therapy

## 2023-09-04 ENCOUNTER — Encounter: Admitting: Physical Therapy

## 2023-09-04 DIAGNOSIS — F4323 Adjustment disorder with mixed anxiety and depressed mood: Secondary | ICD-10-CM | POA: Diagnosis not present

## 2023-09-11 ENCOUNTER — Encounter: Admitting: Physical Therapy

## 2023-09-13 DIAGNOSIS — F4323 Adjustment disorder with mixed anxiety and depressed mood: Secondary | ICD-10-CM | POA: Diagnosis not present

## 2023-09-16 ENCOUNTER — Encounter: Admitting: Physical Therapy

## 2023-09-17 DIAGNOSIS — G629 Polyneuropathy, unspecified: Secondary | ICD-10-CM | POA: Diagnosis not present

## 2023-09-17 DIAGNOSIS — G3184 Mild cognitive impairment, so stated: Secondary | ICD-10-CM | POA: Diagnosis not present

## 2023-09-17 DIAGNOSIS — F32A Depression, unspecified: Secondary | ICD-10-CM | POA: Diagnosis not present

## 2023-09-17 DIAGNOSIS — E538 Deficiency of other specified B group vitamins: Secondary | ICD-10-CM | POA: Diagnosis not present

## 2023-09-17 DIAGNOSIS — F419 Anxiety disorder, unspecified: Secondary | ICD-10-CM | POA: Diagnosis not present

## 2023-09-18 ENCOUNTER — Encounter: Admitting: Physical Therapy

## 2023-09-18 ENCOUNTER — Other Ambulatory Visit: Payer: Self-pay | Admitting: Neurology

## 2023-09-18 DIAGNOSIS — G3184 Mild cognitive impairment, so stated: Secondary | ICD-10-CM

## 2023-09-18 DIAGNOSIS — F4323 Adjustment disorder with mixed anxiety and depressed mood: Secondary | ICD-10-CM | POA: Diagnosis not present

## 2023-09-19 ENCOUNTER — Ambulatory Visit
Admission: RE | Admit: 2023-09-19 | Discharge: 2023-09-19 | Disposition: A | Discharge status: Home / Self Care | Source: Ambulatory Visit | Attending: Neurology | Admitting: Neurology

## 2023-09-19 DIAGNOSIS — G3184 Mild cognitive impairment, so stated: Secondary | ICD-10-CM

## 2023-09-20 NOTE — Progress Notes (Signed)
 Date:  09/23/2023   ID:  Alyssa Mcclure, DOB 01-20-1950, MRN [id number]  Patient Location:  [address] [place] [address]   Provider location:   [facility], [place] office  PCP:  Marikay Eva POUR, PA  Cardiologist:  Perla [facility]  Chief Complaint  Patient presents with   12 month follow up     Patient c/o pain/cramping in legs x 6 months & chest pain that comes and goes with and without activity.     History of Present Illness:    Alyssa Mcclure is a 73 y.o. female past medical history of Hyperlipidemia Hypertension, Chronic cough GERD Former smoker, quit 20 yrs ago, smoked 30 yrs Who presents for follow-up for chest tightness, chronic shortness of breath, nonobstructive coronary disease on cardiac CTA  Last seen by myself in clinic April 2023 Retired from Education officer, environmental job Upset today, was told by neurology that she had dementia She reports symptoms are worse after anesthesia from surgery S/p Rotator cuff surgery  Active at home,  paints, has a dog, takes it on walks Has help, relies on friends to help her with finances No kids live nearby, [place] are out of state  Reports previously trying Zetia  but it gave her leg cramps  Lab work reviewed Total cholesterol 228 LDL 144 A1c 5.7  EKG personally reviewed by myself on todays visit EKG Interpretation Date/Time:  Monday September 23 2023 09:20:14 EDT Ventricular Rate:  78 PR Interval:  178 QRS Duration:  78 QT Interval:  366 QTC Calculation: 417 R Axis:   25  Text Interpretation: Normal sinus rhythm Normal ECG When compared with ECG of 22-Aug-2022 09:10, No significant change was found Confirmed by Perla Lye [phone]) on 09/23/2023 9:41:21 AM   Prior cardiac testing  prior ischemic work-up June 2020 ,  small region perfusion defect  Cardiac CTA performed April 2023  Coronary calcium score of 9  Normal coronary origin with right dominance. Calcified plaque in the LAD  and RCA causing minimal stenosis (<25%). Minimal non-obstructive CAD (0-24%)  Past medical history reviewed Stress echocardiogram ordered for chronic low-grade chest heaviness  performed at [facility] June 09, 2018 Read by Oneil Pinal Exercise for 3 minutes 50 seconds Report indicating mild hypokinesis of the basal inferior wall Ejection fraction greater than 55% Achieved 6.3 METS Target heart rate achieved 160 bpm   Past Medical History:  Diagnosis Date   Arthritis    neck and back and joints   B12 deficiency    Depression    GERD (gastroesophageal reflux disease)    H/O Bell's palsy    Heart murmur    causes no issues   History of kidney stones    HLD (hyperlipidemia)    HOH (hard of hearing)    bilateral   Hypertension    controlled on meds   Impingement syndrome of shoulder, right    Multiple thyroid  nodules    Nasal valve collapse    Nasal vestibulitis    Peripheral vascular disease (HCC)    Pneumonia    Pre-diabetes    Rotator cuff tendinitis, right    Sleep apnea    mild case per Dr. patient brynda that she does not have this   Tendinitis of upper biceps tendon of right shoulder    Past Surgical History:  Procedure Laterality Date   ABDOMINAL HYSTERECTOMY     ABDOMINOPLASTY     BILATERAL CARPAL TUNNEL RELEASE Bilateral  bladder lift  2011   BROW LIFT Bilateral 06/12/2016   Procedure: BLEPHAROPLASTY UPPER EYELID WITH EXCESS SKIN;  Surgeon: Ashley Greig HERO, MD;  Location: [facility];  Service: Ophthalmology;  Laterality: Bilateral;   CATARACT EXTRACTION W/PHACO Right 05/09/2015   Procedure: CATARACT EXTRACTION PHACO AND INTRAOCULAR LENS PLACEMENT (IOC);  Surgeon: Steven Dingeldein, MD;  Location: [facility];  Service: Ophthalmology;  Laterality: Right;  US  45.1 AP% 21.0 CDE 19.36 Fluid Pack Lot # [id number]   CATARACT EXTRACTION W/PHACO Left 03/28/2016   Procedure: CATARACT EXTRACTION PHACO AND INTRAOCULAR LENS PLACEMENT (IOC);  Surgeon: Steven  Dingeldein, MD;  Location: [facility];  Service: Ophthalmology;  Laterality: Left;  US  01:05 AP% 21.3 CDE 24.21 fluid pack lot # [id number]   CESAREAN SECTION     COLONOSCOPY     ESOPHAGOGASTRODUODENOSCOPY (EGD) WITH PROPOFOL  N/A 05/22/2019   Procedure: ESOPHAGOGASTRODUODENOSCOPY (EGD) WITH BIOPSY;  Surgeon: Janalyn Keene NOVAK, MD;  Location: [facility];  Service: Endoscopy;  Laterality: N/A;   EYE SURGERY     HERNIA REPAIR     KNEE ARTHROPLASTY Right 07/02/2018   Procedure: COMPUTER ASSISTED TOTAL KNEE ARTHROPLASTY RIGHT;  Surgeon: Mardee Lynwood SQUIBB, MD;  Location: [facility];  Service: Orthopedics;  Laterality: Right;   KNEE ARTHROPLASTY Left 11/12/2018   Procedure: COMPUTER ASSISTED TOTAL KNEE ARTHROPLASTY;  Surgeon: Mardee Lynwood SQUIBB, MD;  Location: [facility];  Service: Orthopedics;  Laterality: Left;   KNEE ARTHROSCOPY Bilateral    ROTATOR CUFF REPAIR Bilateral    SHOULDER ARTHROSCOPY WITH SUBACROMIAL DECOMPRESSION, ROTATOR CUFF REPAIR AND BICEP TENDON REPAIR Right 10/16/2022   Procedure: RIGHT SHOULDER ARTHROSCOPY WITH DEBRIDEMENT, DECOMPRESSION, ROTATOR CUFF REPAIR, AND BICEPS TENODESIS.;  Surgeon: Edie Norleen PARAS, MD;  Location: [facility];  Service: Orthopedics;  Laterality: Right;     Current Meds  Medication Sig   aspirin EC 81 MG tablet Take 81 mg by mouth daily. Swallow whole.   Biotin 1000 MCG tablet Take 1,000 mcg by mouth daily.   Cyanocobalamin  (VITAMIN B 12 PO) Take 1,000 mcg by mouth daily.   Digestive Enzymes (DIGESTIVE ENZYME PO) Take 1 tablet by mouth daily. With prebiotic and probiotics   donepezil (ARICEPT) 5 MG tablet Take 5 mg by mouth at bedtime.   DULoxetine  (CYMBALTA ) 60 MG capsule Take 60 mg by mouth every morning.    hydrochlorothiazide  (HYDRODIURIL ) 25 MG tablet Take 25 mg by mouth daily.   losartan  (COZAAR ) 100 MG tablet Take 100 mg by mouth every morning.    metoprolol  tartrate (LOPRESSOR ) 50 MG tablet Take 1 tablet (50 mg) 2 hours prior to your CT.    mupirocin cream (BACTROBAN) 2 % Apply 1 Application topically 2 (two) times daily.   omeprazole  (PRILOSEC) 20 MG capsule Take 1 capsule (20 mg total) by mouth 2 (two) times daily before a meal. Take 1 capsule (20 MG total) by mouth two times a day for 30 days and then take 1 capsule (20 MG total) once a day for 30 days.     Allergies:   Penicillins, Adhesive [tape], Losartan , Proton pump inhibitors, Statins, and Latex   Social History   Tobacco Use   Smoking status: Former    Current packs/day: 0.00    Average packs/day: 0.5 packs/day for 30.0 years (15.0 ttl pk-yrs)    Types: Cigarettes    Start date: 64    Quit date: 2005    Years since quitting: 20.7   Smokeless tobacco: Never   Tobacco comments:    quit 10 +  years  Vaping Use   Vaping status: Never Used  Substance Use Topics   Alcohol  use: Yes    Alcohol /week: 1.0 standard drink of alcohol     Types: 1 Glasses of wine per week    Comment: occ   Drug use: No     Family Hx: The patient's family history includes Breast cancer in her cousin and paternal aunt; Breast cancer (age of onset: 72) in her daughter; Heart attack (age of onset: 28) in her father; Heart disease in her mother; Hyperlipidemia in her father and mother; Hypertension in her father and mother. There is no history of Prostate cancer, Kidney cancer, or Bladder Cancer.  ROS:   Please see the history of present illness.    Review of Systems  Constitutional: Negative.   HENT: Negative.    Respiratory: Negative.    Cardiovascular: Negative.   Gastrointestinal: Negative.   Musculoskeletal: Negative.   Neurological: Negative.   Psychiatric/Behavioral: Negative.    All other systems reviewed and are negative.   Labs/Other Tests and Data Reviewed:    Recent Labs: 04/27/2023: ALT 43; BUN 13; Creatinine, Ser 0.87; Hemoglobin 13.6; Platelets 213; Potassium 3.5; Sodium 137   Recent Lipid Panel No results found for: CHOL, TRIG, HDL, CHOLHDL, LDLCALC,  LDLDIRECT  Wt Readings from Last 3 Encounters:  09/23/23 160 lb (72.6 kg)  04/27/23 147 lb 0.8 oz (66.7 kg)  10/16/22 147 lb (66.7 kg)     Exam:    Vital Signs: Vital signs may also be detailed in the HPI BP 120/80 (BP Location: Left Arm, Patient Position: Sitting, Cuff Size: Normal)   Pulse 78   Ht 5' 2 (1.575 m)   Wt 160 lb (72.6 kg)   SpO2 97%   BMI 29.26 kg/m   Constitutional:  oriented to person, place, and time. No distress.  HENT:  Head: Grossly normal Eyes:  no discharge. No scleral icterus.  Neck: No JVD, no carotid bruits  Cardiovascular: Regular rate and rhythm, no murmurs appreciated Pulmonary/Chest: Clear to auscultation bilaterally, no wheezes or rails Abdominal: Soft.  no distension.  no tenderness.  Musculoskeletal: Normal range of motion Neurological:  normal muscle tone. Coordination normal. No atrophy Skin: Skin warm and dry Psychiatric: normal affect, pleasant  ASSESSMENT & PLAN:    Chest pain/angina Denies significant chest pain on exertion Prior workup last year including cardiac CTA showing nonobstructive coronary disease Does not remember trying the Zetia , willing to retry Zetia  10 mg daily If she develops symptoms we will change to PCSK9 inhibitor  Benign essential hypertension Blood pressure is well controlled on today's visit. No changes made to the medications.  Elevated fasting blood sugar Reports that she has a sweet tooth, eats anything she wants, recommend some calorie restriction   Gastroesophageal reflux disease, esophagitis presence not specified Previously managed by GI  Pure hypercholesterolemia Total cholesterol over 220 Does not remember trying zetia , thinks it gave her muscle cramps, willing to retry Zetia   Signed, Evalene Lunger, MD  09/23/2023 9:41 AM    [facility] [address], [place], [place] [address]

## 2023-09-23 ENCOUNTER — Ambulatory Visit: Attending: Cardiovascular Disease | Admitting: Cardiovascular Disease

## 2023-09-23 ENCOUNTER — Encounter: Admitting: Physical Therapy

## 2023-09-23 VITALS — BP 120/80 | HR 78 | Ht 62.0 in | Wt 160.0 lb

## 2023-09-23 DIAGNOSIS — I10 Essential (primary) hypertension: Secondary | ICD-10-CM | POA: Diagnosis not present

## 2023-09-23 DIAGNOSIS — E538 Deficiency of other specified B group vitamins: Secondary | ICD-10-CM | POA: Diagnosis not present

## 2023-09-23 DIAGNOSIS — R0789 Other chest pain: Secondary | ICD-10-CM

## 2023-09-23 DIAGNOSIS — T466X5D Adverse effect of antihyperlipidemic and antiarteriosclerotic drugs, subsequent encounter: Secondary | ICD-10-CM

## 2023-09-23 DIAGNOSIS — M791 Myalgia, unspecified site: Secondary | ICD-10-CM

## 2023-09-23 DIAGNOSIS — E782 Mixed hyperlipidemia: Secondary | ICD-10-CM | POA: Diagnosis not present

## 2023-09-23 DIAGNOSIS — G4733 Obstructive sleep apnea (adult) (pediatric): Secondary | ICD-10-CM | POA: Diagnosis not present

## 2023-09-23 DIAGNOSIS — T466X5A Adverse effect of antihyperlipidemic and antiarteriosclerotic drugs, initial encounter: Secondary | ICD-10-CM

## 2023-09-23 MED ORDER — EZETIMIBE 10 MG PO TABS: 10.0000 mg | ORAL_TABLET | Freq: Every day | ORAL | 3 refills | Status: DC

## 2023-09-23 NOTE — Patient Instructions (Addendum)
 Medication Instructions:  ?Please start zetia 10 mg daily  ? ?If you need a refill on your cardiac medications before your next appointment, please call your pharmacy.  ? ?Lab work: ?No new labs needed ? ?Testing/Procedures: ?No new testing needed ? ?Follow-Up: ?At [facility], you and your health needs are our priority.  As part of our continuing mission to provide you with exceptional heart care, we have created designated Provider Care Teams.  These Care Teams include your primary Cardiologist (physician) and Advanced Practice Providers (APPs -  Physician Assistants and Nurse Practitioners) who all work together to provide you with the care you need, when you need it. ? ?You will need a follow up appointment in 12 months ? ?Providers on your designated Care Team:   ?Nicolasa Ducking, NP ?Eula Listen, PA-C ?Cadence Fransico Michael, PA-C ? ?COVID-19 Vaccine Information can be found at: [email or web address] For questions related to vaccine distribution or appointments, please email [email or web address] or call [phone].  ? ?

## 2023-09-24 DIAGNOSIS — R051 Acute cough: Secondary | ICD-10-CM | POA: Diagnosis not present

## 2023-09-24 DIAGNOSIS — B9689 Other specified bacterial agents as the cause of diseases classified elsewhere: Secondary | ICD-10-CM | POA: Diagnosis not present

## 2023-09-24 DIAGNOSIS — J019 Acute sinusitis, unspecified: Secondary | ICD-10-CM | POA: Diagnosis not present

## 2023-09-24 DIAGNOSIS — J029 Acute pharyngitis, unspecified: Secondary | ICD-10-CM | POA: Diagnosis not present

## 2023-09-24 NOTE — Progress Notes (Signed)
 Subjective:     Patient ID: Alyssa Mcclure is a 73 y.o.  Most recent 2025 labs reviewed - GFR 77, creat 0.8 in anticipation of meds   Cough This is a new problem. The current episode started yesterday. The problem has been gradually worsening. The problem occurs every few minutes. The cough is Non-productive. Associated symptoms include nasal congestion, rhinorrhea and a sore throat. Pertinent negatives include no chest pain, chills, ear congestion, ear pain, eye redness, fever, headaches, myalgias, postnasal drip, rash, shortness of breath or wheezing. The symptoms are aggravated by lying down. She has tried nothing for the symptoms.    The following portions of the patient's history were reviewed and updated as appropriate.  Past Medical History:  has a past medical history of Allergy, B12 deficiency, DDD (degenerative disc disease), cervical, Depression, Derangement of posterior horn of medial meniscus (06/10/2013), Essential hypertension, benign (06/10/2013), GERD (gastroesophageal reflux disease), Heart murmur, History of cataract, Hyperlipidemia, Hypertension, Impingement syndrome of shoulder, right (02/16/2014), Obstructive sleep apnea, Osteoarthritis, Osteoporosis, Pure hypercholesterolemia (10/20/2013), and Surgical menopause.  Problem List: has Essential hypertension, benign; Derangement of posterior horn of medial meniscus; Pure hypercholesterolemia; Elevated fasting blood sugar; Depression; GERD (gastroesophageal reflux disease); Hyperlipidemia; Obstructive sleep apnea; Nasal vestibulitis; Status post total right knee replacement; Recurrent oral ulcers; ANA positive; Status post total left knee replacement; DDD (degenerative disc disease), cervical; Cervical radiculitis; Cervicalgia; Foraminal stenosis of cervical region; Prediabetes; Rotator cuff tendinitis, right; Nontraumatic complete tear of right rotator cuff; Tendinitis of upper biceps tendon of right shoulder; Primary osteoarthritis of  right shoulder; and Degenerative tear of glenoid labrum of right shoulder on their problem list.  Past Surgical History:  has a past surgical history that includes Abdominoplasty (2001); Hysterectomy (1989); Cesarean section; RIGHT INGUINAL HERNIA REPAIR (2002); BILATERAL SHOULDER SURGERY X 2; BILATERAL CARPAL TUNNEL SURGERY; BILATERAL ELBOW SURGERY; REMOVED LUMP FROM RIGHT WRIST VEIN; RIGHT KNEE SURGERY (2006); Knee arthroscopy (07/29/2009); Prolapsed bladder fix (April/May 2011); Colonoscopy (2006); Colonoscopy (04/19/2014); egd (04/19/2014); Cataract extraction (Bilateral); egd (03/14/2017); Hernia repair (2000); Right total knee arthroplasty using computer-assisted navigation (07/02/2018); Left total knee arthroplasty using computer-assisted navigation (11/12/2018); arthroscopy shoulder (Right, 10/16/2022); SHOULDER ARTHROSCOPY WITH SUBACROMIAL DECOMPRESSION, ROTATOR CUFF REPAIR AND BICEP TENDON REPAIR  (Right, 10/16/2022); Colonoscopy; Upper gastrointestinal endoscopy; Eye surgery; and Joint replacement (2020).  Family History: family history includes Arthritis in her father and mother; Breast cancer in her daughter; Coronary Artery Disease (Blocked arteries around heart) in her mother; Depression in her mother; Diabetes in her mother; Diabetes type II in her mother; Epilepsy in an other family member; Heart disease in her father, mother, paternal grandfather, paternal uncle, and paternal uncle; High blood pressure (Hypertension) in her mother; Myocardial Infarction (Heart attack) in her father; Osteoarthritis in her mother; Thyroid  disease in her mother.  Social History:  reports that she quit smoking about 20 years ago. Her smoking use included cigarettes. She started smoking about 55 years ago. She has a 35 pack-year smoking history. She has been exposed to tobacco smoke. She has never used smokeless tobacco. She reports current alcohol  use of about 1.0 standard drink of alcohol  per week. She reports  that she does not use drugs.  Current Medications: has a current medication list which includes the following prescription(s): aspirin, cyanocobalamin  (vitamin b-12), donepezil, duloxetine , losartan , benzonatate, doxycycline, and prednisone, and the following Facility-Administered Medications: cyanocobalamin .  Prior to encounter Medications:  Current Outpatient Medications on File Prior to Visit  Medication Sig Dispense Refill  . aspirin 81 mg tablet Take  1 tablet (81 mg total) by mouth once daily    . CYANOCOBALAMIN , VITAMIN B-12, ORAL Take 1,000 mcg by mouth once daily      . donepeziL (ARICEPT) 5 MG tablet Take 1 tablet (5 mg total) by mouth at bedtime for 30 days 30 tablet 0  . DULoxetine  (CYMBALTA ) 60 MG DR capsule TAKE 1 CAPSULE BY MOUTH EVERY DAY 90 capsule 1  . losartan  (COZAAR ) 100 MG tablet TAKE 1 TABLET BY MOUTH EVERY DAY 90 tablet 1   Current Facility-Administered Medications on File Prior to Visit  Medication Dose Route Frequency Provider Last Rate Last Admin  . cyanocobalamin  (VITAMIN B12) injection 1,000 mcg  1,000 mcg Intramuscular Q30 Days Marikay Spear [place], [place]   1,000 mcg at 09/23/23 1036    Allergies: is allergic to penicillins, omeprazole , proton pump inhibitors, adhesive, losartan , and statins-hmg-coa reductase inhibitors.  Review of Systems  Constitutional:  Negative for chills, fever and malaise/fatigue.  HENT:  Positive for congestion, rhinorrhea and sore throat. Negative for ear pain, postnasal drip, sinus pain and tinnitus.   Eyes:  Negative for pain, discharge and redness.  Respiratory:  Positive for cough. Negative for sputum production, shortness of breath and wheezing.   Cardiovascular:  Negative for chest pain.  Gastrointestinal:  Negative for diarrhea, nausea and vomiting.  Musculoskeletal:  Negative for myalgias.  Skin:  Negative for rash.  Neurological:  Negative for headaches.       Objective:   BP (!) 149/73   Pulse 91   Temp 36.8 C  (98.2 F) (Oral)   Ht 157.5 cm (5' 2)   Wt 70.9 kg (156 lb 6.4 oz)   SpO2 95%   BMI 28.61 kg/m    Physical Exam Vitals and nursing note reviewed.  Constitutional:      General: She is not in acute distress.    Appearance: Normal appearance. She is well-developed. She is not ill-appearing.  HENT:     Head: Normocephalic and atraumatic.     Right Ear: Ear canal and external ear normal. A middle ear effusion is present.     Left Ear: Tympanic membrane, ear canal and external ear normal.     Nose: No mucosal edema, congestion or rhinorrhea.     Right Turbinates: Not enlarged or swollen.     Left Turbinates: Not enlarged or swollen.     Right Sinus: Frontal sinus tenderness present. No maxillary sinus tenderness.     Left Sinus: Frontal sinus tenderness present. No maxillary sinus tenderness.     Mouth/Throat:     Mouth: Mucous membranes are moist.     Pharynx: Oropharynx is clear. Postnasal drip present. No oropharyngeal exudate or posterior oropharyngeal erythema.     Tonsils: No tonsillar abscesses.  Eyes:     Conjunctiva/sclera: Conjunctivae normal.     Pupils: Pupils are equal, round, and reactive to light.  Neck:     Thyroid : No thyromegaly.     Trachea: No tracheal deviation.  Cardiovascular:     Rate and Rhythm: Normal rate and regular rhythm.     Heart sounds: Normal heart sounds.  Pulmonary:     Effort: Pulmonary effort is normal.     Breath sounds: Normal breath sounds.  Musculoskeletal:        General: Normal range of motion.     Cervical back: Normal range of motion and neck supple.  Lymphadenopathy:     Cervical: No cervical adenopathy.  Skin:    General: Skin is warm and dry.  Neurological:     General: No focal deficit present.     Mental Status: She is alert and oriented to person, place, and time.  Psychiatric:        Mood and Affect: Mood normal.        Behavior: Behavior normal.     Results for orders placed or performed in visit on 09/24/23   Xpert Dx Strep A, PCR - [facility]   Specimen: Throat  Result Value Ref Range   Xpress Strep A, PCR Not Detected Not Detected, INVALID  Extended Respiratory Viral Panel - [facility]   Specimen: Nasal Swab; Other  Result Value Ref Range   Influenza A PCR Negative Negative   Influenza B PCR Negative Negative   RSV PCR Negative Negative   SARS-CoV2 PCR Negative Negative   Narrative   Positive  Positive results are indicative of the presence of the identified virus, but do not rule out bacterial infection or co-infection with other pathogens not detected by the test. Clinical correlation with patient history and other diagnostic information is necessary to determine patient infection status.  Negative  Negative results do not preclude SARS-CoV-2 infection, Influenza A virus, Influenza B virus and/or RSV infection and should not be used as the sole basis for treatment or other patient management decisions. Negative results must be combined with clinical observations, patient history, and/or epidemiological information.         Assessment:     1. Acute bacterial sinusitis -     predniSONE (DELTASONE) 10 MG tablet; Take 1 tablet (10 mg total) by mouth 2 (two) times daily for 5 days  Dispense: 10 tablet; Refill: 0 -     doxycycline (VIBRAMYCIN) 100 MG capsule; Take 1 capsule (100 mg total) by mouth 2 (two) times daily for 7 days  Dispense: 14 capsule; Refill: 0  2. Sore throat, unspecified -     Extended Respiratory Viral Panel [facility]; Future -     Xpert Dx Strep A, PCR - [facility]  3. Acute cough -     Extended Respiratory Viral Panel - [facility]; Future -     Xpert Dx Strep A, PCR - [facility] -     benzonatate (TESSALON) 100 MG capsule; Take 1 capsule (100 mg total) by mouth 3 (three) times daily as needed for Cough for up to 7 days  Dispense: 20 capsule; Refill: 0 -     predniSONE (DELTASONE) 10 MG tablet; Take 1 tablet (10 mg total) by mouth 2 (two) times daily for 5 days  Dispense:  10 tablet; Refill: 0  Likely viral but with underlying comorbidities will cover for bacterial etiology with doxy, allergic to PCN.     Plan:     Patient Instructions  You are being treated for a sinus infection. You are being prescribed antibiotics, please take as directed. Warm compresses, nasal saline washes, steam showers, warm salt water  gargles for further symptomatic relief. OTC Flonase nasal spray for congestion. Tylenol  per package instructions for pain relief. Follow up in clinic if symptoms persist despite treatment. Go to ED if develop chest pain, shortness of breath, sudden/severe headache, neurologic symptoms, changes in vision, neck stiffness.

## 2023-09-25 ENCOUNTER — Encounter: Admitting: Physical Therapy

## 2023-09-25 ENCOUNTER — Ambulatory Visit: Payer: Self-pay

## 2023-09-25 DIAGNOSIS — F4323 Adjustment disorder with mixed anxiety and depressed mood: Secondary | ICD-10-CM | POA: Diagnosis not present

## 2023-09-25 DIAGNOSIS — R41841 Cognitive communication deficit: Secondary | ICD-10-CM | POA: Diagnosis not present

## 2023-09-30 ENCOUNTER — Encounter: Admitting: Physical Therapy

## 2023-10-02 ENCOUNTER — Encounter: Admitting: Physical Therapy

## 2023-10-07 ENCOUNTER — Encounter: Admitting: Physical Therapy

## 2023-10-09 ENCOUNTER — Encounter: Admitting: Physical Therapy

## 2023-10-09 DIAGNOSIS — R41841 Cognitive communication deficit: Secondary | ICD-10-CM | POA: Diagnosis not present

## 2023-10-11 DIAGNOSIS — F4323 Adjustment disorder with mixed anxiety and depressed mood: Secondary | ICD-10-CM | POA: Diagnosis not present

## 2023-10-14 ENCOUNTER — Encounter: Admitting: Physical Therapy

## 2023-10-16 ENCOUNTER — Encounter: Admitting: Physical Therapy

## 2023-10-21 ENCOUNTER — Encounter: Admitting: Physical Therapy

## 2023-10-23 ENCOUNTER — Encounter: Admitting: Physical Therapy

## 2023-10-23 DIAGNOSIS — R41841 Cognitive communication deficit: Secondary | ICD-10-CM | POA: Diagnosis not present

## 2023-10-24 DIAGNOSIS — E538 Deficiency of other specified B group vitamins: Secondary | ICD-10-CM | POA: Diagnosis not present

## 2023-10-25 DIAGNOSIS — F4323 Adjustment disorder with mixed anxiety and depressed mood: Secondary | ICD-10-CM | POA: Diagnosis not present

## 2023-10-28 ENCOUNTER — Encounter: Admitting: Physical Therapy

## 2023-10-30 ENCOUNTER — Encounter: Admitting: Physical Therapy

## 2023-10-30 DIAGNOSIS — R41841 Cognitive communication deficit: Secondary | ICD-10-CM | POA: Diagnosis not present

## 2023-11-01 DIAGNOSIS — F4323 Adjustment disorder with mixed anxiety and depressed mood: Secondary | ICD-10-CM | POA: Diagnosis not present

## 2023-11-06 DIAGNOSIS — F4323 Adjustment disorder with mixed anxiety and depressed mood: Secondary | ICD-10-CM | POA: Diagnosis not present

## 2023-11-13 DIAGNOSIS — R41841 Cognitive communication deficit: Secondary | ICD-10-CM | POA: Diagnosis not present

## 2023-11-15 DIAGNOSIS — F4323 Adjustment disorder with mixed anxiety and depressed mood: Secondary | ICD-10-CM | POA: Diagnosis not present

## 2023-11-19 ENCOUNTER — Other Ambulatory Visit: Payer: Self-pay

## 2023-11-19 ENCOUNTER — Emergency Department
Admission: EM | Admit: 2023-11-19 | Discharge: 2023-11-20 | Disposition: A | Discharge status: Home / Self Care | Attending: Emergency Medicine | Admitting: Emergency Medicine

## 2023-11-19 ENCOUNTER — Encounter: Payer: Self-pay | Admitting: Emergency Medicine

## 2023-11-19 ENCOUNTER — Emergency Department

## 2023-11-19 DIAGNOSIS — S0990XA Unspecified injury of head, initial encounter: Secondary | ICD-10-CM | POA: Diagnosis not present

## 2023-11-19 DIAGNOSIS — M47812 Spondylosis without myelopathy or radiculopathy, cervical region: Secondary | ICD-10-CM | POA: Diagnosis not present

## 2023-11-19 DIAGNOSIS — S0181XA Laceration without foreign body of other part of head, initial encounter: Secondary | ICD-10-CM | POA: Insufficient documentation

## 2023-11-19 DIAGNOSIS — I10 Essential (primary) hypertension: Secondary | ICD-10-CM | POA: Insufficient documentation

## 2023-11-19 DIAGNOSIS — I6200 Nontraumatic subdural hemorrhage, unspecified: Secondary | ICD-10-CM | POA: Diagnosis not present

## 2023-11-19 DIAGNOSIS — W01198A Fall on same level from slipping, tripping and stumbling with subsequent striking against other object, initial encounter: Secondary | ICD-10-CM | POA: Diagnosis not present

## 2023-11-19 DIAGNOSIS — M4312 Spondylolisthesis, cervical region: Secondary | ICD-10-CM | POA: Diagnosis not present

## 2023-11-19 DIAGNOSIS — S065X0A Traumatic subdural hemorrhage without loss of consciousness, initial encounter: Secondary | ICD-10-CM | POA: Diagnosis not present

## 2023-11-19 DIAGNOSIS — Z23 Encounter for immunization: Secondary | ICD-10-CM | POA: Insufficient documentation

## 2023-11-19 DIAGNOSIS — W19XXXA Unspecified fall, initial encounter: Secondary | ICD-10-CM

## 2023-11-19 DIAGNOSIS — M542 Cervicalgia: Secondary | ICD-10-CM | POA: Diagnosis not present

## 2023-11-19 DIAGNOSIS — S065XAA Traumatic subdural hemorrhage with loss of consciousness status unknown, initial encounter: Secondary | ICD-10-CM | POA: Insufficient documentation

## 2023-11-19 MED ORDER — TETANUS-DIPHTH-ACELL PERTUSSIS 5-2-15.5 LF-MCG/0.5 IM SUSP
0.5000 mL | Freq: Once | INTRAMUSCULAR | Status: AC
  Administered 2023-11-19: 0.5 mL via INTRAMUSCULAR
  Filled 2023-11-19: qty 0.5

## 2023-11-19 MED ORDER — LIDOCAINE-EPINEPHRINE 2 %-1:100000 IJ SOLN
20.0000 mL | Freq: Once | INTRAMUSCULAR | Status: AC
  Administered 2023-11-19: 20 mL
  Filled 2023-11-19: qty 1

## 2023-11-19 MED ORDER — ACETAMINOPHEN 325 MG PO TABS
650.0000 mg | ORAL_TABLET | Freq: Once | ORAL | Status: AC
  Administered 2023-11-19: 650 mg via ORAL
  Filled 2023-11-19: qty 2

## 2023-11-19 NOTE — ED Provider Notes (Signed)
 [facility] Provider Note    Event Date/Time   First MD Initiated Contact with Patient 11/19/23 2129     (approximate)   History   Fall   HPI  Alyssa Mcclure is a 73 y.o. female with a history of hypertension, hyperlipidemia, GERD, OSA, DDD, and prediabetes who presents with a fall and head injury.  The patient states that she was walking outside to her mailbox at night, tripped over her flip-flops, and fell forward hitting the front of her head.  She denies losing consciousness.  She has no other injuries.  She denies any neck or back pain.  She sustained a laceration to her forehead.  I reviewed the past medical records.  The patient's most recent outpatient encounter in our system was on 9/16 with internal medicine for a cough.  She was diagnosed with a likely viral infection but covered for bacterial infection with doxycycline.   Physical Exam   Triage Vital Signs: ED Triage Vitals  Encounter Vitals Group     BP 11/19/23 2123 (!) 170/85     Girls Systolic BP Percentile --      Girls Diastolic BP Percentile --      Boys Systolic BP Percentile --      Boys Diastolic BP Percentile --      Pulse Rate 11/19/23 2123 85     Resp 11/19/23 2123 20     Temp 11/19/23 2123 97.7 F (36.5 C)     Temp Source 11/19/23 2123 Oral     SpO2 11/19/23 2123 100 %     Weight 11/19/23 2124 160 lb 15 oz (73 kg)     Height 11/19/23 2124 5' 2 (1.575 m)     Head Circumference --      Peak Flow --      Pain Score 11/19/23 2122 7     Pain Loc --      Pain Education --      Exclude from Growth Chart --     Most recent vital signs: Vitals:   11/19/23 2123 11/19/23 2212  BP: (!) 170/85   Pulse: 85   Resp: 20   Temp: 97.7 F (36.5 C)   SpO2: 100% 100%     General: Awake, no distress.  CV:  Good peripheral perfusion.  Resp:  Normal effort.  Abd:  No distention.  Other:  EOMI.  PERRLA.  Right periorbital swelling.  No facial droop.  Normal speech.  Motor intact  in all extremities.  No ataxia.  Approximately 4 cm laceration vertically to the right forehead.   ED Results / Procedures / Treatments   Labs (all labs ordered are listed, but only abnormal results are displayed) Labs Reviewed - No data to display   EKG     RADIOLOGY  CT head:   IMPRESSION:  1. Right tentorial subdural hematoma measuring up to 4 mm, without significant  mass effect or parenchymal hemorrhage.  2. Soft tissue defect consistent with focal laceration in the right forehead  laterally.  3. Air-fluid level in the right maxillary sinus.  4. Mild atrophic changes.    CT cervical spine:   IMPRESSION:  1. Degenerative changes without acute abnormality.     PROCEDURES:  Critical Care performed: No  .Laceration Repair  Date/Time: 11/20/2023 12:02 AM  Performed by: Jacolyn Pae, MD Authorized by: Jacolyn Pae, MD   Consent:    Consent obtained:  Verbal   Consent given by:  Patient  Risks discussed:  Infection, pain, need for additional repair, poor wound healing, poor cosmetic result and retained foreign body   Alternatives discussed:  No treatment Universal protocol:    Patient identity confirmed:  Verbally with patient Anesthesia:    Anesthesia method:  Local infiltration   Local anesthetic:  Lidocaine  2% WITH epi Laceration details:    Location:  Face   Face location:  Forehead   Length (cm):  4   Depth (mm):  3 Pre-procedure details:    Preparation:  Patient was prepped and draped in usual sterile fashion Exploration:    Hemostasis achieved with:  Direct pressure and epinephrine    Contaminated: no   Treatment:    Area cleansed with:  Povidone-iodine    Amount of cleaning:  Extensive   Irrigation method:  Syringe   Debridement:  None Skin repair:    Repair method:  Sutures   Suture size:  5-0   Suture material:  Nylon   Suture technique:  Simple interrupted   Number of sutures:  10 Approximation:    Approximation:   Close Repair type:    Repair type:  Simple Post-procedure details:    Dressing:  Sterile dressing   Procedure completion:  Tolerated well, no immediate complications    MEDICATIONS ORDERED IN ED: Medications  Tdap (ADACEL) injection 0.5 mL (0.5 mLs Intramuscular Given 11/19/23 2212)  lidocaine -EPINEPHrine  (XYLOCAINE  W/EPI) 2 %-1:100000 (with pres) injection 20 mL (20 mLs Other Given by Other 11/19/23 2311)  acetaminophen  (TYLENOL ) tablet 650 mg (650 mg Oral Given 11/19/23 2310)     IMPRESSION / MDM / ASSESSMENT AND PLAN / ED COURSE  I reviewed the triage vital signs and the nursing notes.  73 year old female with PMH as noted above presents with a head injury after mechanical fall from standing height, with a laceration to her forehead.  Neurologic exam is nonfocal.  Differential diagnosis includes, but is not limited to, minor head injury, concussion, intracranial hemorrhage.  Patient's presentation is most consistent with acute presentation with potential threat to life or bodily function.  We will obtain CT head and cervical spine, update the patient's tetanus, and repair the laceration.  ----------------------------------------- 12:02 AM on 11/20/2023 -----------------------------------------  The laceration was repaired.  CT shows a small subdural hematoma with no mass effect.  I consulted and discussed the case with Dr. Claudene from neurosurgery who recommends a repeat 6-hour CT.  I have signed the patient out to the oncoming ED physician Dr. Viviann.   FINAL CLINICAL IMPRESSION(S) / ED DIAGNOSES   Final diagnoses:  Fall, initial encounter  Laceration of forehead, initial encounter  Subdural hematoma (HCC)     Rx / DC Orders   ED Discharge Orders     None        Note:  This document was prepared using Dragon voice recognition software and may include unintentional dictation errors.    Jacolyn Pae, MD 11/20/23 0003

## 2023-11-19 NOTE — ED Triage Notes (Signed)
 Pt arrived via [facility] from home where she tripped while walking and struck her forehead on concrete flooring. Pt denies blood thinners and denies LOC.

## 2023-11-20 ENCOUNTER — Emergency Department

## 2023-11-20 DIAGNOSIS — S0990XA Unspecified injury of head, initial encounter: Secondary | ICD-10-CM | POA: Diagnosis not present

## 2023-11-20 DIAGNOSIS — S0003XA Contusion of scalp, initial encounter: Secondary | ICD-10-CM | POA: Diagnosis not present

## 2023-11-20 DIAGNOSIS — R220 Localized swelling, mass and lump, head: Secondary | ICD-10-CM | POA: Diagnosis not present

## 2023-11-20 DIAGNOSIS — I6200 Nontraumatic subdural hemorrhage, unspecified: Secondary | ICD-10-CM | POA: Diagnosis not present

## 2023-11-20 NOTE — ED Provider Notes (Signed)
 Procedures     ----------------------------------------- 4:53 AM on 11/20/2023 -----------------------------------------   Repeat CT head stable.  Mental status normal.  Stable for discharge   Viviann Pastor, MD 11/20/23 [phone]

## 2023-11-20 NOTE — ED Notes (Addendum)
 Fall risk bundle in place.

## 2023-11-20 NOTE — Discharge Instructions (Signed)
 Your repeat CT scan of the brain shows that the bleeding in the head has stopped.  Please follow-up with neurosurgery for continued monitoring of your symptoms.

## 2023-11-25 DIAGNOSIS — E538 Deficiency of other specified B group vitamins: Secondary | ICD-10-CM | POA: Diagnosis not present

## 2023-11-26 DIAGNOSIS — S0285XD Fracture of orbit, unspecified, subsequent encounter for fracture with routine healing: Secondary | ICD-10-CM | POA: Diagnosis not present

## 2023-11-26 DIAGNOSIS — Z4802 Encounter for removal of sutures: Secondary | ICD-10-CM | POA: Diagnosis not present

## 2023-11-26 DIAGNOSIS — S0181XD Laceration without foreign body of other part of head, subsequent encounter: Secondary | ICD-10-CM | POA: Diagnosis not present

## 2023-11-26 DIAGNOSIS — Z8679 Personal history of other diseases of the circulatory system: Secondary | ICD-10-CM | POA: Diagnosis not present

## 2023-11-27 DIAGNOSIS — R41841 Cognitive communication deficit: Secondary | ICD-10-CM | POA: Diagnosis not present

## 2023-11-29 DIAGNOSIS — F4323 Adjustment disorder with mixed anxiety and depressed mood: Secondary | ICD-10-CM | POA: Diagnosis not present

## 2023-12-10 ENCOUNTER — Telehealth: Payer: Self-pay | Admitting: Neurosurgery

## 2023-12-10 ENCOUNTER — Other Ambulatory Visit: Payer: Self-pay | Admitting: Neurosurgery

## 2023-12-10 DIAGNOSIS — S065XAA Traumatic subdural hemorrhage with loss of consciousness status unknown, initial encounter: Secondary | ICD-10-CM

## 2023-12-10 NOTE — Telephone Encounter (Signed)
 Patient is wanting to know if she needs to get a repeat CT prior to her appointment with Stacy on 12/18/23.  Don is sending a message to Dr. Claudene to confirm if imagining is needed.   Patient also requests a call back as she is having trouble with her MyChart app.

## 2023-12-10 NOTE — Telephone Encounter (Signed)
 CT scan order placed, see other phone note. Patient needs to go ahead and schedule. I spoke with patient and advised her of this and transferred her to scheduling.

## 2023-12-10 NOTE — Telephone Encounter (Signed)
 Danielle placed this order now

## 2023-12-10 NOTE — Telephone Encounter (Signed)
 Please order repeat CT scan for patient to follow up

## 2023-12-11 DIAGNOSIS — R41841 Cognitive communication deficit: Secondary | ICD-10-CM | POA: Diagnosis not present

## 2023-12-12 ENCOUNTER — Telehealth: Admitting: Physician Assistant

## 2023-12-12 DIAGNOSIS — R3989 Other symptoms and signs involving the genitourinary system: Secondary | ICD-10-CM

## 2023-12-12 MED ORDER — SULFAMETHOXAZOLE-TRIMETHOPRIM 800-160 MG PO TABS: 1.0000 | ORAL_TABLET | Freq: Two times a day (BID) | ORAL | 0 refills | Status: AC

## 2023-12-12 NOTE — Progress Notes (Signed)
 Message sent to patient, awaiting response.

## 2023-12-12 NOTE — Progress Notes (Signed)
 Most recent CMP from July 1 reviewed.  GFR is 77 with no other kidney function deficits

## 2023-12-12 NOTE — Progress Notes (Signed)
 We are sorry that you are not feeling well.  Here is how we plan to help!  Based on what you shared with me it looks like you most likely have a simple urinary tract infection, without any other red flag signs.  Your chart does not show any history of kidney disorder.  A UTI (Urinary Tract Infection) is a bacterial infection of the bladder.  Most cases of urinary tract infections are simple to treat but a key part of your care is to encourage you to drink plenty of fluids and watch your symptoms carefully.  I have prescribed Bactrim DS One tablet twice a day for 3 days.  Penicillin allergy listed in chart.  Your symptoms should gradually improve.   Call us  if the burning in your urine worsens, you develop worsening fever, back pain or pelvic pain or if your symptoms do not resolve after completing the antibiotic.  Urinary tract infections can be prevented by drinking plenty of water  to keep your body hydrated.  Also be sure when you wipe, wipe from front to back and don't hold it in!  If possible, empty your bladder every 4 hours.  Your e-visit answers were reviewed by a board certified advanced clinical practitioner to complete your personal care plan.  Depending on the condition, your plan could have included both over the counter or prescription medications.  If there is a problem please reply  once you have received a response from your provider.  Your safety is important to us .  If you have drug allergies check your prescription carefully.    You can use MyChart to ask questions about today's visit, request a non-urgent call back, or ask for a work or school excuse for 24 hours related to this e-Visit. If it has been greater than 24 hours you will need to follow up with your provider, or enter a new e-Visit to address those concerns.   You will get an e-mail in the next two days asking about your experience.  I hope that your e-visit has been valuable and will speed your recovery. Thank  you for using e-visits.  I have spent 10 minutes in review of e-visit questionnaire, review and updating patient chart, medical decision making and response to patient.   Piper Albro, PA-C

## 2023-12-13 ENCOUNTER — Ambulatory Visit: Admission: RE | Admit: 2023-12-13 | Discharge: 2023-12-13 | Attending: Neurosurgery

## 2023-12-13 DIAGNOSIS — S065XAA Traumatic subdural hemorrhage with loss of consciousness status unknown, initial encounter: Secondary | ICD-10-CM | POA: Diagnosis not present

## 2023-12-16 NOTE — Progress Notes (Unsigned)
 Referring Physician:  Marikay Eva POUR, PA [address] [facility] [address] [facility] [address] [facility] [place],  [place] [address]  Primary Physician:  Marikay Eva POUR, [place]  History of Present Illness: 12/16/2023 [name] is here today with a chief complaint of ***   Hosp. f/u subdural hematoma Any headaches, dizziness, or vision problems?   Duration: *** Location: *** Quality: *** Severity: ***  Precipitating: aggravated by *** Modifying factors: made better by *** Weakness: none Timing: *** Bowel/Bladder Dysfunction: none  Conservative measures:  Physical therapy: ***  Multimodal medical therapy including regular antiinflammatories: ***  Injections: *** epidural steroid injections  Past Surgery: ***  [name] has ***no symptoms of cervical myelopathy.  The symptoms are causing a significant impact on the patient's life.   Review of Systems:  A 10 point review of systems is negative, except for the pertinent positives and negatives detailed in the HPI.  Past Medical History: Past Medical History:  Diagnosis Date   Arthritis    neck and back and joints   B12 deficiency    Depression    GERD (gastroesophageal reflux disease)    H/O Bell's palsy    Heart murmur    causes no issues   History of kidney stones    HLD (hyperlipidemia)    HOH (hard of hearing)    bilateral   Hypertension    controlled on meds   Impingement syndrome of shoulder, right    Multiple thyroid  nodules    Nasal valve collapse    Nasal vestibulitis    Peripheral vascular disease    Pneumonia    Pre-diabetes    Rotator cuff tendinitis, right    Sleep apnea    mild case per Dr. patient staes that she does not have this   Tendinitis of upper biceps tendon of right shoulder     Past Surgical History: Past Surgical History:  Procedure Laterality Date   ABDOMINAL HYSTERECTOMY     ABDOMINOPLASTY     BILATERAL CARPAL TUNNEL RELEASE Bilateral    bladder lift  2011   BROW LIFT  Bilateral 06/12/2016   Procedure: BLEPHAROPLASTY UPPER EYELID WITH EXCESS SKIN;  Surgeon: Ashley Greig HERO, MD;  [facility];  Service: Ophthalmology;  Laterality: Bilateral;   CATARACT EXTRACTION W/PHACO Right 05/09/2015   Procedure: CATARACT EXTRACTION PHACO AND INTRAOCULAR LENS PLACEMENT (IOC);  Surgeon: Steven Dingeldein, MD;  Location: [facility];  Service: Ophthalmology;  Laterality: Right;  US  45.1 AP% 21.0 CDE 19.36 Fluid Pack Lot # [id number]   CATARACT EXTRACTION W/PHACO Left 03/28/2016   Procedure: CATARACT EXTRACTION PHACO AND INTRAOCULAR LENS PLACEMENT (IOC);  Surgeon: Steven Dingeldein, MD;  Location: [facility];  Service: Ophthalmology;  Laterality: Left;  US  01:05 AP% 21.3 CDE 24.21 fluid pack lot # [id number]   CESAREAN SECTION     COLONOSCOPY     ESOPHAGOGASTRODUODENOSCOPY (EGD) WITH PROPOFOL  N/A 05/22/2019   Procedure: ESOPHAGOGASTRODUODENOSCOPY (EGD) WITH BIOPSY;  Surgeon: Janalyn Keene NOVAK, MD;  Location: [facility];  Service: Endoscopy;  Laterality: N/A;   EYE SURGERY     HERNIA REPAIR     KNEE ARTHROPLASTY Right 07/02/2018   Procedure: COMPUTER ASSISTED TOTAL KNEE ARTHROPLASTY RIGHT;  Surgeon: Mardee Lynwood SQUIBB, MD;  Location: [facility];  Service: Orthopedics;  Laterality: Right;   KNEE ARTHROPLASTY Left 11/12/2018   Procedure: COMPUTER ASSISTED TOTAL KNEE ARTHROPLASTY;  Surgeon: Mardee Lynwood SQUIBB, MD;  Location: [facility];  Service: Orthopedics;  Laterality: Left;   KNEE ARTHROSCOPY Bilateral  ROTATOR CUFF REPAIR Bilateral    SHOULDER ARTHROSCOPY WITH SUBACROMIAL DECOMPRESSION, ROTATOR CUFF REPAIR AND BICEP TENDON REPAIR Right 10/16/2022   Procedure: RIGHT SHOULDER ARTHROSCOPY WITH DEBRIDEMENT, DECOMPRESSION, ROTATOR CUFF REPAIR, AND BICEPS TENODESIS.;  Surgeon: Edie Norleen PARAS, MD;  Location: [facility];  Service: Orthopedics;  Laterality: Right;    Allergies: Allergies as of 12/19/2023 - Review Complete 12/12/2023  Allergen Reaction Noted    Penicillins Other (See Comments) and Hives 05/06/2015   Adhesive [tape] Itching 06/06/2016   Losartan  Cough 12/17/2013   Proton pump inhibitors Other (See Comments) 05/09/2022   Statins Other (See Comments) 06/10/2013   Latex Rash 05/09/2015    Medications: Outpatient Encounter Medications as of 12/19/2023  Medication Sig   aspirin EC 81 MG tablet Take 81 mg by mouth daily. Swallow whole.   Biotin 1000 MCG tablet Take 1,000 mcg by mouth daily.   Cyanocobalamin  (VITAMIN B 12 PO) Take 1,000 mcg by mouth daily.   Digestive Enzymes (DIGESTIVE ENZYME PO) Take 1 tablet by mouth daily. With prebiotic and probiotics   DULoxetine  (CYMBALTA ) 60 MG capsule Take 60 mg by mouth every morning.    hydrochlorothiazide  (HYDRODIURIL ) 25 MG tablet Take 25 mg by mouth daily.   losartan  (COZAAR ) 100 MG tablet Take 100 mg by mouth every morning.    metoprolol  tartrate (LOPRESSOR ) 50 MG tablet Take 1 tablet (50 mg) 2 hours prior to your CT.   mupirocin cream (BACTROBAN) 2 % Apply 1 Application topically 2 (two) times daily.   omeprazole  (PRILOSEC) 20 MG capsule Take 1 capsule (20 mg total) by mouth 2 (two) times daily before a meal. Take 1 capsule (20 MG total) by mouth two times a day for 30 days and then take 1 capsule (20 MG total) once a day for 30 days.   No facility-administered encounter medications on file as of 12/19/2023.    Social History: Social History   Tobacco Use   Smoking status: Former    Current packs/day: 0.00    Average packs/day: 0.5 packs/day for 30.0 years (15.0 ttl pk-yrs)    Types: Cigarettes    Start date: 11    Quit date: 2005    Years since quitting: 20.9   Smokeless tobacco: Never   Tobacco comments:    quit 10 + years  Vaping Use   Vaping status: Never Used  Substance Use Topics   Alcohol  use: Yes    Alcohol /week: 1.0 standard drink of alcohol     Types: 1 Glasses of wine per week    Comment: occ   Drug use: No    Family Medical History: Family History   Problem Relation Age of Onset   Hypertension Mother    Hyperlipidemia Mother    Heart disease Mother    Hyperlipidemia Father    Hypertension Father    Heart attack Father 98   Breast cancer Paternal Aunt    Breast cancer Daughter 55   Breast cancer Cousin        2 cousins 1 mat, 1 pat   Prostate cancer Neg Hx    Kidney cancer Neg Hx    Bladder Cancer Neg Hx     Physical Examination: @VITALWITHPAIN @  General: Patient is well developed, well nourished, calm, collected, and in no apparent distress. Attention to examination is appropriate.  Psychiatric: Patient is non-anxious.  Head:  Pupils equal, round, and reactive to light.  ENT:  Oral mucosa appears well hydrated.  Neck:   Supple.  ***Full range of motion.  Respiratory: Patient is breathing without any difficulty.  Extremities: No edema.  Vascular: Palpable dorsal pedal pulses.  Skin:   On exposed skin, there are no abnormal skin lesions.  NEUROLOGICAL:     Awake, alert, oriented to person, place, and time.  Speech is clear and fluent. Fund of knowledge is appropriate.   Cranial Nerves: Pupils equal round and reactive to light.  Facial tone is symmetric.  Facial sensation is symmetric.  ROM of spine: ***full.  Palpation of spine: ***non tender.    Strength: Side Biceps Triceps Deltoid Interossei Grip Wrist Ext. Wrist Flex.  R 5 5 5 5 5 5 5   L 5 5 5 5 5 5 5    Side Iliopsoas Quads Hamstring PF DF EHL  R 5 5 5 5 5 5   L 5 5 5 5 5 5    Reflexes are ***2+ and symmetric at the biceps, triceps, brachioradialis, patella and achilles.   Hoffman's is absent.  Clonus is not present.  Toes are down-going.  Bilateral upper and lower extremity sensation is intact to light touch.    Gait is normal.   No difficulty with tandem gait.   No evidence of dysmetria noted.  Medical Decision Making  Imaging: ***  I have personally reviewed the images and agree with the above interpretation.  Assessment and Plan: [name]  is a pleasant 73 y.o. female with ***    Thank you for involving me in the care of this patient.   I spent a total of *** minutes in both face-to-face and non-face-to-face activities for this visit on the date of this encounter.   [employer] Dept. of Neurosurgery

## 2023-12-18 ENCOUNTER — Ambulatory Visit: Admitting: Orthopedic Surgery

## 2023-12-19 ENCOUNTER — Ambulatory Visit: Admitting: Neurosurgery

## 2023-12-19 ENCOUNTER — Encounter: Payer: Self-pay | Admitting: Neurosurgery

## 2023-12-19 VITALS — BP 108/78 | Wt 152.2 lb

## 2023-12-19 DIAGNOSIS — S065XAA Traumatic subdural hemorrhage with loss of consciousness status unknown, initial encounter: Secondary | ICD-10-CM

## 2023-12-19 DIAGNOSIS — W19XXXA Unspecified fall, initial encounter: Secondary | ICD-10-CM
